# Patient Record
Sex: Male | Born: 1970 | Race: Black or African American | Hispanic: No | Marital: Married | Smoking: Never smoker
Health system: Southern US, Community
[De-identification: ages and names within clinical notes are randomized; demographics above are authoritative.]

## PROBLEM LIST (undated history)

## (undated) DIAGNOSIS — G8929 Other chronic pain: Secondary | ICD-10-CM

## (undated) DIAGNOSIS — C8190 Hodgkin lymphoma, unspecified, unspecified site: Secondary | ICD-10-CM

## (undated) DIAGNOSIS — R519 Headache, unspecified: Secondary | ICD-10-CM

## (undated) DIAGNOSIS — E785 Hyperlipidemia, unspecified: Secondary | ICD-10-CM

## (undated) DIAGNOSIS — R51 Headache: Secondary | ICD-10-CM

## (undated) HISTORY — PX: WISDOM TOOTH EXTRACTION: SHX21

## (undated) HISTORY — DX: Headache: R51

## (undated) HISTORY — PX: HERNIA REPAIR: SHX51

## (undated) HISTORY — DX: Hyperlipidemia, unspecified: E78.5

## (undated) HISTORY — DX: Hodgkin lymphoma, unspecified, unspecified site: C81.90

## (undated) HISTORY — DX: Other chronic pain: G89.29

## (undated) HISTORY — DX: Headache, unspecified: R51.9

---

## 1997-02-12 HISTORY — PX: PORTACATH PLACEMENT: SHX2246

## 1998-11-15 ENCOUNTER — Ambulatory Visit (HOSPITAL_COMMUNITY): Admission: RE | Admit: 1998-11-15 | Discharge: 1998-11-15 | Payer: Self-pay | Admitting: Family Medicine

## 1998-11-15 ENCOUNTER — Encounter: Payer: Self-pay | Admitting: Family Medicine

## 1998-11-17 ENCOUNTER — Ambulatory Visit (HOSPITAL_COMMUNITY): Admission: RE | Admit: 1998-11-17 | Discharge: 1998-11-17 | Payer: Self-pay | Admitting: Family Medicine

## 1998-11-17 ENCOUNTER — Encounter: Payer: Self-pay | Admitting: Family Medicine

## 1998-11-21 ENCOUNTER — Encounter: Payer: Self-pay | Admitting: Family Medicine

## 1998-11-21 ENCOUNTER — Encounter (INDEPENDENT_AMBULATORY_CARE_PROVIDER_SITE_OTHER): Payer: Self-pay | Admitting: *Deleted

## 1998-11-21 ENCOUNTER — Ambulatory Visit (HOSPITAL_COMMUNITY): Admission: RE | Admit: 1998-11-21 | Discharge: 1998-11-21 | Payer: Self-pay | Admitting: Family Medicine

## 1998-12-01 ENCOUNTER — Encounter: Payer: Self-pay | Admitting: Oncology

## 1998-12-01 ENCOUNTER — Ambulatory Visit (HOSPITAL_COMMUNITY): Admission: RE | Admit: 1998-12-01 | Discharge: 1998-12-01 | Payer: Self-pay | Admitting: Oncology

## 1998-12-05 ENCOUNTER — Ambulatory Visit (HOSPITAL_COMMUNITY): Admission: RE | Admit: 1998-12-05 | Discharge: 1998-12-05 | Payer: Self-pay | Admitting: Oncology

## 1998-12-05 ENCOUNTER — Encounter: Payer: Self-pay | Admitting: Oncology

## 1998-12-12 ENCOUNTER — Encounter: Payer: Self-pay | Admitting: Hematology & Oncology

## 1998-12-12 ENCOUNTER — Ambulatory Visit (HOSPITAL_COMMUNITY): Admission: RE | Admit: 1998-12-12 | Discharge: 1998-12-12 | Payer: Self-pay | Admitting: Oncology

## 1999-01-01 ENCOUNTER — Ambulatory Visit (HOSPITAL_COMMUNITY): Admission: RE | Admit: 1999-01-01 | Discharge: 1999-01-01 | Payer: Self-pay | Admitting: Oncology

## 1999-03-13 ENCOUNTER — Encounter: Payer: Self-pay | Admitting: Oncology

## 1999-03-13 ENCOUNTER — Ambulatory Visit (HOSPITAL_COMMUNITY): Admission: RE | Admit: 1999-03-13 | Discharge: 1999-03-13 | Payer: Self-pay | Admitting: Oncology

## 1999-07-12 ENCOUNTER — Encounter: Payer: Self-pay | Admitting: Oncology

## 1999-07-12 ENCOUNTER — Ambulatory Visit (HOSPITAL_COMMUNITY): Admission: RE | Admit: 1999-07-12 | Discharge: 1999-07-12 | Payer: Self-pay | Admitting: Gastroenterology

## 1999-10-06 ENCOUNTER — Encounter: Payer: Self-pay | Admitting: Oncology

## 1999-10-06 ENCOUNTER — Ambulatory Visit (HOSPITAL_COMMUNITY): Admission: RE | Admit: 1999-10-06 | Discharge: 1999-10-06 | Payer: Self-pay | Admitting: Oncology

## 1999-10-06 ENCOUNTER — Emergency Department (HOSPITAL_COMMUNITY): Admission: EM | Admit: 1999-10-06 | Discharge: 1999-10-07 | Payer: Self-pay | Admitting: Emergency Medicine

## 2000-03-05 ENCOUNTER — Ambulatory Visit (HOSPITAL_COMMUNITY): Admission: RE | Admit: 2000-03-05 | Discharge: 2000-03-05 | Payer: Self-pay | Admitting: Oncology

## 2000-03-05 ENCOUNTER — Encounter: Payer: Self-pay | Admitting: Oncology

## 2000-12-02 ENCOUNTER — Ambulatory Visit (HOSPITAL_COMMUNITY): Admission: RE | Admit: 2000-12-02 | Discharge: 2000-12-02 | Payer: Self-pay | Admitting: Oncology

## 2000-12-02 ENCOUNTER — Encounter: Payer: Self-pay | Admitting: Oncology

## 2001-04-15 ENCOUNTER — Ambulatory Visit (HOSPITAL_COMMUNITY): Admission: RE | Admit: 2001-04-15 | Discharge: 2001-04-15 | Payer: Self-pay | Admitting: Oncology

## 2001-04-15 ENCOUNTER — Encounter: Payer: Self-pay | Admitting: Oncology

## 2003-07-26 ENCOUNTER — Ambulatory Visit (HOSPITAL_COMMUNITY): Admission: RE | Admit: 2003-07-26 | Discharge: 2003-07-26 | Payer: Self-pay | Admitting: Oncology

## 2004-04-28 ENCOUNTER — Ambulatory Visit (HOSPITAL_COMMUNITY): Admission: RE | Admit: 2004-04-28 | Discharge: 2004-04-28 | Payer: Self-pay | Admitting: Oncology

## 2004-05-11 ENCOUNTER — Ambulatory Visit: Payer: Self-pay | Admitting: Oncology

## 2004-12-05 ENCOUNTER — Ambulatory Visit: Payer: Self-pay | Admitting: Oncology

## 2005-02-15 ENCOUNTER — Ambulatory Visit: Payer: Self-pay | Admitting: Oncology

## 2005-11-09 ENCOUNTER — Ambulatory Visit: Payer: Self-pay | Admitting: Oncology

## 2008-03-22 ENCOUNTER — Ambulatory Visit: Payer: Self-pay | Admitting: Oncology

## 2008-11-29 ENCOUNTER — Ambulatory Visit: Payer: Self-pay | Admitting: Oncology

## 2008-12-06 LAB — CBC WITH DIFFERENTIAL/PLATELET
BASO%: 0.2 % (ref 0.0–2.0)
HCT: 40 % (ref 38.4–49.9)
LYMPH%: 47.4 % (ref 14.0–49.0)
MCH: 22.6 pg — ABNORMAL LOW (ref 27.2–33.4)
MCV: 70 fL — ABNORMAL LOW (ref 79.3–98.0)
NEUT%: 43.5 % (ref 39.0–75.0)
RBC: 5.71 10*6/uL (ref 4.20–5.82)
RDW: 15 % — ABNORMAL HIGH (ref 11.0–14.6)
lymph#: 1.9 10*3/uL (ref 0.9–3.3)

## 2008-12-06 LAB — COMPREHENSIVE METABOLIC PANEL
ALT: 71 U/L — ABNORMAL HIGH (ref 0–53)
Albumin: 4.7 g/dL (ref 3.5–5.2)
CO2: 24 mEq/L (ref 19–32)
Calcium: 9.2 mg/dL (ref 8.4–10.5)
Chloride: 106 mEq/L (ref 96–112)
Potassium: 4 mEq/L (ref 3.5–5.3)
Total Bilirubin: 0.3 mg/dL (ref 0.3–1.2)
Total Protein: 7.9 g/dL (ref 6.0–8.3)

## 2009-02-16 ENCOUNTER — Encounter: Admission: RE | Admit: 2009-02-16 | Discharge: 2009-05-17 | Payer: Self-pay | Admitting: Family Medicine

## 2009-11-25 ENCOUNTER — Ambulatory Visit: Payer: Self-pay | Admitting: Oncology

## 2010-03-04 ENCOUNTER — Encounter: Payer: Self-pay | Admitting: Oncology

## 2010-11-26 ENCOUNTER — Emergency Department (HOSPITAL_COMMUNITY)
Admission: EM | Admit: 2010-11-26 | Discharge: 2010-11-26 | Disposition: A | Discharge status: Home / Self Care | Payer: BC Managed Care – PPO | Attending: Emergency Medicine | Admitting: Emergency Medicine

## 2010-11-26 DIAGNOSIS — L03211 Cellulitis of face: Secondary | ICD-10-CM | POA: Insufficient documentation

## 2010-11-26 DIAGNOSIS — L0201 Cutaneous abscess of face: Secondary | ICD-10-CM | POA: Insufficient documentation

## 2011-01-30 ENCOUNTER — Ambulatory Visit: Payer: BC Managed Care – PPO | Admitting: Oncology

## 2011-01-30 ENCOUNTER — Other Ambulatory Visit: Payer: BC Managed Care – PPO | Admitting: Lab

## 2011-05-05 ENCOUNTER — Encounter: Payer: Self-pay | Admitting: Internal Medicine

## 2011-05-05 DIAGNOSIS — E119 Type 2 diabetes mellitus without complications: Secondary | ICD-10-CM | POA: Insufficient documentation

## 2011-05-05 DIAGNOSIS — Z0000 Encounter for general adult medical examination without abnormal findings: Secondary | ICD-10-CM | POA: Insufficient documentation

## 2011-05-05 DIAGNOSIS — Z0001 Encounter for general adult medical examination with abnormal findings: Secondary | ICD-10-CM

## 2011-05-07 ENCOUNTER — Other Ambulatory Visit (INDEPENDENT_AMBULATORY_CARE_PROVIDER_SITE_OTHER): Payer: BC Managed Care – PPO

## 2011-05-07 ENCOUNTER — Other Ambulatory Visit: Payer: Self-pay | Admitting: Internal Medicine

## 2011-05-07 ENCOUNTER — Ambulatory Visit (INDEPENDENT_AMBULATORY_CARE_PROVIDER_SITE_OTHER): Payer: BC Managed Care – PPO | Admitting: Internal Medicine

## 2011-05-07 ENCOUNTER — Encounter: Payer: Self-pay | Admitting: Internal Medicine

## 2011-05-07 VITALS — BP 102/62 | HR 82 | Temp 98.4°F | Ht 74.0 in | Wt 229.0 lb

## 2011-05-07 DIAGNOSIS — Z0000 Encounter for general adult medical examination without abnormal findings: Secondary | ICD-10-CM

## 2011-05-07 DIAGNOSIS — C8590 Non-Hodgkin lymphoma, unspecified, unspecified site: Secondary | ICD-10-CM | POA: Insufficient documentation

## 2011-05-07 DIAGNOSIS — IMO0001 Reserved for inherently not codable concepts without codable children: Secondary | ICD-10-CM

## 2011-05-07 DIAGNOSIS — E785 Hyperlipidemia, unspecified: Secondary | ICD-10-CM | POA: Insufficient documentation

## 2011-05-07 DIAGNOSIS — Z79899 Other long term (current) drug therapy: Secondary | ICD-10-CM

## 2011-05-07 LAB — CBC WITH DIFFERENTIAL/PLATELET
Basophils Absolute: 0 10*3/uL (ref 0.0–0.1)
Eosinophils Absolute: 0 10*3/uL (ref 0.0–0.7)
Eosinophils Relative: 1 % (ref 0.0–5.0)
HCT: 42.2 % (ref 39.0–52.0)
Lymphocytes Relative: 50.6 % — ABNORMAL HIGH (ref 12.0–46.0)
Lymphs Abs: 2.1 10*3/uL (ref 0.7–4.0)
MCV: 70.5 fl — ABNORMAL LOW (ref 78.0–100.0)
Monocytes Absolute: 0.3 10*3/uL (ref 0.1–1.0)
Monocytes Relative: 6.9 % (ref 3.0–12.0)
Neutro Abs: 1.7 10*3/uL (ref 1.4–7.7)
Platelets: 232 10*3/uL (ref 150.0–400.0)
WBC: 4.1 10*3/uL — ABNORMAL LOW (ref 4.5–10.5)

## 2011-05-07 LAB — URINALYSIS, ROUTINE W REFLEX MICROSCOPIC
Specific Gravity, Urine: >=1.03 (ref 1.000–1.030)
Urine Glucose: NEGATIVE

## 2011-05-07 LAB — HEPATIC FUNCTION PANEL
ALT: 61 U/L — ABNORMAL HIGH (ref 0–53)
Albumin: 4.4 g/dL (ref 3.5–5.2)
Alkaline Phosphatase: 62 U/L (ref 39–117)
Total Protein: 8.6 g/dL — ABNORMAL HIGH (ref 6.0–8.3)

## 2011-05-07 LAB — LIPID PANEL
Cholesterol: 181 mg/dL (ref 0–200)
HDL: 48.8 mg/dL (ref >39.00)
Total CHOL/HDL Ratio: 4
Triglycerides: 103 mg/dL (ref 0.0–149.0)

## 2011-05-07 LAB — TSH: TSH: 1.29 u[IU]/mL (ref 0.35–5.50)

## 2011-05-07 LAB — BASIC METABOLIC PANEL
Chloride: 109 mEq/L (ref 96–112)
GFR: 86.49 mL/min (ref >60.00)
Glucose, Bld: 119 mg/dL — ABNORMAL HIGH (ref 70–99)

## 2011-05-07 MED ORDER — PRAVASTATIN SODIUM 20 MG PO TABS: 20.0000 mg | ORAL_TABLET | Freq: Every day | ORAL | Status: DC

## 2011-05-07 MED ORDER — ASPIRIN 81 MG PO TBEC: 81.0000 mg | DELAYED_RELEASE_TABLET | Freq: Every day | ORAL | Status: AC

## 2011-05-07 MED ORDER — METFORMIN HCL 500 MG PO TABS: ORAL_TABLET | ORAL | Status: DC

## 2011-05-07 NOTE — Progress Notes (Signed)
Subjective:    Patient ID: William Sheppard, male    DOB: [birth year], 41 y.o.   MRN: [id number]  HPI  Here for wellness and to establish as new pt;  Overall doing ok;  Pt denies CP, worsening SOB, DOE, wheezing, orthopnea, PND, worsening LE edema, palpitations, dizziness or syncope.  Pt denies neurological change such as new Headache, facial or extremity weakness.  Pt denies polydipsia, polyuria, or low sugar symptoms. Pt states overall good compliance with treatment and medications, good tolerability, and trying to follow lower cholesterol diet.  Pt denies worsening depressive symptoms, suicidal ideation or panic. No fever, night sweats, loss of appetite, or other constitutional symptoms.  Pt states good ability with ADL's, low fall risk, home safety reviewed and adequate, no significant changes in hearing or vision, and occasionally active with exercise.  Does have discomfort to the right lateral prox leg and right knee  with basketball, no LBP, slows him down only when he rests and gets up to start move again; notices more at night to move around in the bed; no trauma, fever, falls, swelling, or giveaway. Has been able to lose some wt though not sure how much in the past yr with better diet Past Medical History  Diagnosis Date  . Diabetes mellitus   . Chronic headaches   . Hyperlipidemia   . Non Hodgkin's lymphoma    History reviewed. No pertinent past surgical history.  reports that he has never smoked. He does not have any smokeless tobacco history on file. He reports that he drinks alcohol. He reports that he does not use illicit drugs. family history includes Diabetes in his other. No Known Allergies No current outpatient prescriptions on file prior to visit.   Review of Systems Review of Systems  Constitutional: Negative for diaphoresis, activity change, appetite change and unexpected weight change.  HENT: Negative for hearing loss, ear pain, facial swelling, mouth sores and neck  stiffness.   Eyes: Negative for pain, redness and visual disturbance.  Respiratory: Negative for shortness of breath and wheezing.   Cardiovascular: Negative for chest pain and palpitations.  Gastrointestinal: Negative for diarrhea, blood in stool, abdominal distention and rectal pain.  Genitourinary: Negative for hematuria, flank pain and decreased urine volume.  Musculoskeletal: Negative for myalgias and joint swelling.  Skin: Negative for color change and wound.  Neurological: Negative for syncope and numbness.  Hematological: Negative for adenopathy.  Psychiatric/Behavioral: Negative for hallucinations, self-injury, decreased concentration and agitation.  Does have a cyst to the leg prox right    Objective:   Physical Exam BP 102/62  Pulse 82  Temp(Src) 98.4 F (36.9 C) (Oral)  Ht 6\' 2"  (1.88 m)  Wt 229 lb (103.874 kg)  BMI 29.40 kg/m2  SpO2 96% Physical Exam  VS noted Constitutional: Pt is oriented to person, place, and time. Appears well-developed and well-nourished.  HENT:  Head: Normocephalic and atraumatic.  Right Ear: External ear normal.  Left Ear: External ear normal.  Nose: Nose normal.  Mouth/Throat: Oropharynx is clear and moist.  Eyes: Conjunctivae and EOM are normal. Pupils are equal, round, and reactive to light.  Neck: Normal range of motion. Neck supple. No JVD present. No tracheal deviation present.  Cardiovascular: Normal rate, regular rhythm, normal heart sounds and intact distal pulses.   Pulmonary/Chest: Effort normal and breath sounds normal.  Abdominal: Soft. Bowel sounds are normal. There is no tenderness.  Musculoskeletal: Normal range of motion. Exhibits no edema.  Lymphadenopathy:  Has no cervical adenopathy.  Neurological: Pt is alert and oriented to person, place, and time. Pt has normal reflexes. No cranial nerve deficit.  Skin: Skin is warm and dry. No rash noted.  Psychiatric:  Has  normal mood and affect. Behavior is normal.  Right hip and  knee NT, FROM, no effusion    Assessment & Plan:

## 2011-05-07 NOTE — Assessment & Plan Note (Signed)

## 2011-05-07 NOTE — Patient Instructions (Signed)
Please start aspirin 81 mg - 1 per day, COATED only Please keep your appointments with your specialists as you have planned - Dr Darnelle Catalan every 5 yrs Please go to LAB in the Basement for the blood and/or urine tests to be done today You will be contacted by phone if any changes need to be made immediately.  Otherwise, you will receive a letter about your results with an explanation. Please continue to focus on exercise, lower cholesterol diet, and wt loss Please return in 1 year for your yearly visit, or sooner if needed, with Lab testing done 3-5 days before

## 2011-05-07 NOTE — Assessment & Plan Note (Signed)
stable overall by hx and exam, , and pt to continue medical treatment as before   

## 2011-05-09 ENCOUNTER — Telehealth: Payer: Self-pay

## 2011-05-09 MED ORDER — GLIPIZIDE ER 2.5 MG PO TB24: 2.5000 mg | ORAL_TABLET | Freq: Every day | ORAL | Status: DC

## 2011-05-09 NOTE — Telephone Encounter (Signed)
Addended by: Corwin Levins on: 05/09/2011 01:31 PM   Modules accepted: Orders

## 2011-05-09 NOTE — Telephone Encounter (Signed)
The patient called back to receive lab results and instructions. He stated he has been on Metformin in the past and it caused upset stomach. Please alternative, also wanted to know if anything herbal remedies he can try for cholesterol. He did agree to take pravachol.

## 2011-05-09 NOTE — Telephone Encounter (Signed)
Patient has been informed of medication change. 

## 2011-05-09 NOTE — Telephone Encounter (Signed)
There are no "natural" OTC's that are effective for cholesterol lowering  Ok to d/c the metformin  Ok to start the glipizide ER low dose - done per [employer]

## 2011-05-16 ENCOUNTER — Encounter: Payer: Self-pay | Admitting: Internal Medicine

## 2011-06-27 ENCOUNTER — Telehealth: Payer: Self-pay

## 2011-06-27 MED ORDER — METFORMIN HCL 500 MG PO TABS: 500.0000 mg | ORAL_TABLET | Freq: Every day | ORAL | Status: DC

## 2011-06-27 NOTE — Telephone Encounter (Signed)
Called the patient left detailed message prescription requested has been sent in. 

## 2011-06-27 NOTE — Telephone Encounter (Signed)
It is not clear to me his symtpoms are from the glucotrol, but ok to change to metformin -done per emr

## 2011-06-27 NOTE — Telephone Encounter (Signed)
The patient called to inform he is having some dizziness and tingling in his feet.  The patient thinks these symptoms may be coming from the glipizide, he would like to change to Metformin. Please advise, pharmacy is [facility]

## 2011-07-27 ENCOUNTER — Ambulatory Visit (INDEPENDENT_AMBULATORY_CARE_PROVIDER_SITE_OTHER): Payer: BC Managed Care – PPO | Admitting: Endocrinology

## 2011-07-27 ENCOUNTER — Ambulatory Visit (INDEPENDENT_AMBULATORY_CARE_PROVIDER_SITE_OTHER)
Admission: RE | Admit: 2011-07-27 | Discharge: 2011-07-27 | Disposition: A | Discharge status: Home / Self Care | Payer: BC Managed Care – PPO | Source: Ambulatory Visit | Attending: Endocrinology | Admitting: Endocrinology

## 2011-07-27 ENCOUNTER — Encounter: Payer: Self-pay | Admitting: Endocrinology

## 2011-07-27 VITALS — BP 108/72 | HR 87 | Temp 97.8°F | Ht 74.0 in | Wt 221.0 lb

## 2011-07-27 DIAGNOSIS — M79609 Pain in unspecified limb: Secondary | ICD-10-CM

## 2011-07-27 DIAGNOSIS — M79671 Pain in right foot: Secondary | ICD-10-CM

## 2011-07-27 NOTE — Progress Notes (Signed)
  Subjective:    Patient ID: William Sheppard, male    DOB: [birth year], 41 y.o.   MRN: [id number]  HPI Pt states few mos of moderate pain at the right heel.  No assoc numbness.  It did not start in the context of an injury.   Past Medical History  Diagnosis Date  . Diabetes mellitus   . Chronic headaches   . Hyperlipidemia   . Non Hodgkin's lymphoma     No past surgical history on file.  History   Social History  . Marital Status: Married    Spouse Name: N/A    Number of Children: N/A  . Years of Education: masters   Occupational History  . Naval architect   Social History Main Topics  . Smoking status: Never Smoker   . Smokeless tobacco: Not on file  . Alcohol Use: Yes     social  . Drug Use: No  . Sexually Active: Not on file   Other Topics Concern  . Not on file   Social History Narrative  . No narrative on file    Current Outpatient Prescriptions on File Prior to Visit  Medication Sig Dispense Refill  . aspirin 81 MG EC tablet Take 1 tablet (81 mg total) by mouth daily. Swallow whole.  30 tablet  12  . metFORMIN (GLUCOPHAGE) 500 MG tablet Take 1 tablet (500 mg total) by mouth daily.  90 tablet  3  . pravastatin (PRAVACHOL) 20 MG tablet Take 1 tablet (20 mg total) by mouth daily.  90 tablet  3    Allergies  Allergen Reactions  . Metformin And Related Nausea Only   Family History  Problem Relation Age of Onset  . Diabetes Other    BP 108/72  Pulse 87  Temp 97.8 F (36.6 C) (Oral)  Ht 6\' 2"  (1.88 m)  Wt 221 lb (100.245 kg)  BMI 28.37 kg/m2  SpO2 97%  Review of Systems Denies left foot pain and cold intolerance.     Objective:   Physical Exam VITAL SIGNS:  See vs page GENERAL: no distress Pulses: dorsalis pedis intact bilat.   Feet: no deformity.  no ulcer on the feet.  feet are of normal color and temp.  no edema Neuro: sensation is intact to touch on the feet The left heel is normal.    (i reviewed x-ray result)    Assessment &  Plan:  Foot pain, new.  uncertain etiology Type 2 DM.  This places him at risk for PAD.

## 2011-07-27 NOTE — Patient Instructions (Addendum)
Refer to a foot specialist.   Let's check a test for the circulation.  you will receive a phone call, about days and times for appointments.   An x-ray is requested for you today.  You will receive a letter with results.

## 2011-08-06 ENCOUNTER — Ambulatory Visit (INDEPENDENT_AMBULATORY_CARE_PROVIDER_SITE_OTHER): Payer: BC Managed Care – PPO | Admitting: Internal Medicine

## 2011-08-06 ENCOUNTER — Other Ambulatory Visit (INDEPENDENT_AMBULATORY_CARE_PROVIDER_SITE_OTHER): Payer: BC Managed Care – PPO

## 2011-08-06 ENCOUNTER — Encounter: Payer: Self-pay | Admitting: Internal Medicine

## 2011-08-06 VITALS — BP 112/70 | HR 89 | Temp 97.8°F | Ht 74.0 in | Wt 227.0 lb

## 2011-08-06 DIAGNOSIS — E785 Hyperlipidemia, unspecified: Secondary | ICD-10-CM

## 2011-08-06 DIAGNOSIS — Z79899 Other long term (current) drug therapy: Secondary | ICD-10-CM

## 2011-08-06 DIAGNOSIS — M79609 Pain in unspecified limb: Secondary | ICD-10-CM

## 2011-08-06 DIAGNOSIS — M79671 Pain in right foot: Secondary | ICD-10-CM

## 2011-08-06 LAB — HEPATIC FUNCTION PANEL
Albumin: 4.4 g/dL (ref 3.5–5.2)
Alkaline Phosphatase: 48 U/L (ref 39–117)
Bilirubin, Direct: 0 mg/dL (ref 0.0–0.3)
Total Protein: 8.3 g/dL (ref 6.0–8.3)

## 2011-08-06 LAB — HEMOGLOBIN A1C: Hgb A1c MFr Bld: 7 % — ABNORMAL HIGH (ref 4.6–6.5)

## 2011-08-06 LAB — BASIC METABOLIC PANEL
BUN: 21 mg/dL (ref 6–23)
CO2: 27 mEq/L (ref 19–32)
Chloride: 107 mEq/L (ref 96–112)
Potassium: 4.1 mEq/L (ref 3.5–5.1)

## 2011-08-06 LAB — LIPID PANEL
Cholesterol: 166 mg/dL (ref 0–200)
LDL Cholesterol: 97 mg/dL (ref 0–99)
Total CHOL/HDL Ratio: 3
VLDL: 18.2 mg/dL (ref 0.0–40.0)

## 2011-08-06 NOTE — Patient Instructions (Addendum)
Continue all other medications as before Please start the Aspirin 81 mg  - 1 per day - COATED only Please go to LAB in the Basement for the blood and/or urine tests to be done today You will be contacted by phone if any changes need to be made immediately.  Otherwise, you will receive a letter about your results with an explanation. Please return in 6 mo with Lab testing done 3-5 days before

## 2011-08-06 NOTE — Assessment & Plan Note (Signed)
Heel pain, c/w plantar fasciitis, ok to hold the LE arterial dopplers., for alleve prn, consider podiatry for cortisone, for stretching excercises and avoid hard surfaces/long standing/hard soled shoes

## 2011-08-06 NOTE — Assessment & Plan Note (Addendum)
stable overall by hx and exam, most recent data reviewed with pt, and pt to continue medical treatment as before Lab Results  Component Value Date   LDLCALC 112* 05/07/2011   To re-check on statin

## 2011-08-06 NOTE — Assessment & Plan Note (Signed)
stable overall by hx and exam, most recent data reviewed with pt, and pt to continue medical treatment as before - the new low dose metformin, cont wt loss effort Lab Results  Component Value Date   HGBA1C 7.9* 05/07/2011

## 2011-08-06 NOTE — Progress Notes (Signed)
  Subjective:    Patient ID: William Sheppard, male    DOB: [birth year], 41 y.o.   MRN: [id number]  HPI  Here to f/u; overall doing ok,  Pt denies chest pain, increased sob or doe, wheezing, orthopnea, PND, increased LE swelling, palpitations, dizziness or syncope.  Pt denies new neurological symptoms such as new headache, or facial or extremity weakness or numbness   Pt denies polydipsia, polyuria, or low sugar symptoms such as weakness or confusion improved with po intake.  Pt states overall good compliance with meds, trying to follow lower cholesterol, diabetic diet, wt overall stable but little exercise however.  Has been able to lose a few more since last visit.  Did see Dr Everardo All with heel pain recently, worse to first get up and stand up, and worse after playiong basketball.  Has not gotten the vascular study done.   Past Medical History  Diagnosis Date  . Diabetes mellitus   . Chronic headaches   . Hyperlipidemia   . Non Hodgkin's lymphoma    No past surgical history on file.  reports that he has never smoked. He does not have any smokeless tobacco history on file. He reports that he drinks alcohol. He reports that he does not use illicit drugs. family history includes Diabetes in his other. Allergies  Allergen Reactions  . Metformin And Related Nausea Only   Current Outpatient Prescriptions on File Prior to Visit  Medication Sig Dispense Refill  . metFORMIN (GLUCOPHAGE) 500 MG tablet Take 1 tablet (500 mg total) by mouth daily.  90 tablet  3  . pravastatin (PRAVACHOL) 20 MG tablet Take 1 tablet (20 mg total) by mouth daily.  90 tablet  3  . aspirin 81 MG EC tablet Take 1 tablet (81 mg total) by mouth daily. Swallow whole.  30 tablet  12   Review of Systems Constitutional: Negative for diaphoresis and unexpected weight change.  HENT: Negative for drooling and tinnitus.   Eyes: Negative for photophobia and visual disturbance.  Respiratory: Negative for choking and stridor.     Gastrointestinal: Negative for vomiting and blood in stool.  Genitourinary: Negative for hematuria and decreased urine volume.  Musculoskeletal: still with right heel pain/tender.  Skin: Negative for color change and wound.  Neurological: Negative for tremors and numbness.  Psychiatric/Behavioral: Negative for decreased concentration. The patient is not hyperactive.      Objective:   Physical Exam BP 112/70  Pulse 89  Temp 97.8 F (36.6 C) (Oral)  Ht 6\' 2"  (1.88 m)  Wt 227 lb (102.967 kg)  BMI 29.15 kg/m2  SpO2 95% Physical Exam  VS noted Constitutional: Pt appears well-developed and well-nourished.  HENT: Head: Normocephalic.  Right Ear: External ear normal.  Left Ear: External ear normal.  Eyes: Conjunctivae and EOM are normal. Pupils are equal, round, and reactive to light.  Neck: Normal range of motion. Neck supple.  Cardiovascular: Normal rate and regular rhythm.   Pulmonary/Chest: Effort normal and breath sounds normal.  Neurological: Pt is alert. Not confused Skin: Skin is warm. No erythema.  Psychiatric: Pt behavior is normal. Thought content normal.  Right heel:  Mild tender right plantar heel without red, swelling, ulcer    Assessment & Plan:

## 2011-10-16 ENCOUNTER — Other Ambulatory Visit: Payer: Self-pay

## 2011-10-16 MED ORDER — METFORMIN HCL 500 MG PO TABS: 500.0000 mg | ORAL_TABLET | Freq: Every day | ORAL | Status: DC

## 2011-10-16 MED ORDER — PRAVASTATIN SODIUM 20 MG PO TABS: 20.0000 mg | ORAL_TABLET | Freq: Every day | ORAL | Status: DC

## 2012-01-28 ENCOUNTER — Ambulatory Visit: Payer: BC Managed Care – PPO | Admitting: Internal Medicine

## 2012-01-28 DIAGNOSIS — Z0289 Encounter for other administrative examinations: Secondary | ICD-10-CM

## 2012-03-04 ENCOUNTER — Ambulatory Visit: Payer: BC Managed Care – PPO | Admitting: Internal Medicine

## 2012-04-25 ENCOUNTER — Telehealth: Payer: Self-pay

## 2012-04-25 MED ORDER — METFORMIN HCL 500 MG PO TABS: 500.0000 mg | ORAL_TABLET | Freq: Every day | ORAL | Status: DC

## 2012-04-25 NOTE — Telephone Encounter (Signed)
Refilled Metformin.

## 2012-05-07 ENCOUNTER — Encounter: Payer: Self-pay | Admitting: Internal Medicine

## 2012-05-07 ENCOUNTER — Ambulatory Visit (INDEPENDENT_AMBULATORY_CARE_PROVIDER_SITE_OTHER): Payer: BC Managed Care – PPO | Admitting: Internal Medicine

## 2012-05-07 ENCOUNTER — Other Ambulatory Visit (INDEPENDENT_AMBULATORY_CARE_PROVIDER_SITE_OTHER): Payer: BC Managed Care – PPO

## 2012-05-07 VITALS — BP 112/80 | HR 74 | Temp 97.2°F | Ht 74.0 in | Wt 224.0 lb

## 2012-05-07 DIAGNOSIS — Z0000 Encounter for general adult medical examination without abnormal findings: Secondary | ICD-10-CM

## 2012-05-07 DIAGNOSIS — IMO0001 Reserved for inherently not codable concepts without codable children: Secondary | ICD-10-CM

## 2012-05-07 LAB — BASIC METABOLIC PANEL
BUN: 15 mg/dL (ref 6–23)
GFR: 92.23 mL/min (ref >60.00)
Glucose, Bld: 110 mg/dL — ABNORMAL HIGH (ref 70–99)
Potassium: 5.2 mEq/L — ABNORMAL HIGH (ref 3.5–5.1)

## 2012-05-07 LAB — LIPID PANEL
Cholesterol: 145 mg/dL (ref 0–200)
VLDL: 21.2 mg/dL (ref 0.0–40.0)

## 2012-05-07 LAB — URINALYSIS, ROUTINE W REFLEX MICROSCOPIC
Ketones, ur: NEGATIVE
Leukocytes, UA: NEGATIVE
Specific Gravity, Urine: 1.025 (ref 1.000–1.030)
Urobilinogen, UA: 0.2 (ref 0.0–1.0)

## 2012-05-07 LAB — PSA: PSA: 0.39 ng/mL (ref 0.10–4.00)

## 2012-05-07 LAB — MICROALBUMIN / CREATININE URINE RATIO: Microalb, Ur: 6.9 mg/dL — ABNORMAL HIGH (ref 0.0–1.9)

## 2012-05-07 LAB — CBC WITH DIFFERENTIAL/PLATELET
Eosinophils Relative: 0.7 % (ref 0.0–5.0)
HCT: 42.7 % (ref 39.0–52.0)
Monocytes Relative: 7.4 % (ref 3.0–12.0)
Neutrophils Relative %: 38.7 % — ABNORMAL LOW (ref 43.0–77.0)
Platelets: 222 10*3/uL (ref 150.0–400.0)
WBC: 4.8 10*3/uL (ref 4.5–10.5)

## 2012-05-07 LAB — HEPATIC FUNCTION PANEL
ALT: 42 U/L (ref 0–53)
AST: 23 U/L (ref 0–37)
Albumin: 4.6 g/dL (ref 3.5–5.2)

## 2012-05-07 LAB — HEMOGLOBIN A1C: Hgb A1c MFr Bld: 6.6 % — ABNORMAL HIGH (ref 4.6–6.5)

## 2012-05-07 LAB — TSH: TSH: 1.58 u[IU]/mL (ref 0.35–5.50)

## 2012-05-07 MED ORDER — PRAVASTATIN SODIUM 20 MG PO TABS: 20.0000 mg | ORAL_TABLET | Freq: Every day | ORAL | Status: DC

## 2012-05-07 MED ORDER — LISINOPRIL 5 MG PO TABS: 5.0000 mg | ORAL_TABLET | Freq: Every day | ORAL | Status: DC

## 2012-05-07 MED ORDER — ASPIRIN 81 MG PO TBEC: 81.0000 mg | DELAYED_RELEASE_TABLET | Freq: Every day | ORAL | Status: DC

## 2012-05-07 MED ORDER — METFORMIN HCL 500 MG PO TABS: 500.0000 mg | ORAL_TABLET | Freq: Every day | ORAL | Status: DC

## 2012-05-07 NOTE — Assessment & Plan Note (Signed)

## 2012-05-07 NOTE — Progress Notes (Addendum)
Subjective:    Patient ID: William Sheppard, male    DOB: 07-23-1970, 42 y.o.   MRN: [id number]  HPI  Here for wellness and f/u;  Overall doing ok;  Pt denies CP, worsening SOB, DOE, wheezing, orthopnea, PND, worsening LE edema, palpitations, dizziness or syncope.  Pt denies neurological change such as new headache, facial or extremity weakness.  Pt denies polydipsia, polyuria, or low sugar symptoms. Pt states overall good compliance with treatment and medications, good tolerability, and has been trying to follow lower cholesterol diet.  Pt denies worsening depressive symptoms, suicidal ideation or panic. No fever, night sweats, wt loss, loss of appetite, or other constitutional symptoms.  Pt states good ability with ADL's, has low fall risk, home safety reviewed and adequate, no other significant changes in hearing or vision, and only occasionally active with exercise.  Needs med refills, no acute complaints, has been doing better recently with more exercise and wt loss, tyring to get to 210 Past Medical History  Diagnosis Date  . Diabetes mellitus   . Chronic headaches   . Hyperlipidemia   . Non Hodgkin's lymphoma    No past surgical history on file.  reports that he has never smoked. He does not have any smokeless tobacco history on file. He reports that  drinks alcohol. He reports that he does not use illicit drugs. family history includes Diabetes in his other. Allergies  Allergen Reactions  . Metformin And Related Nausea Only   No current outpatient prescriptions on file prior to visit.   No current facility-administered medications on file prior to visit.   Review of Systems Constitutional: Negative for diaphoresis, activity change, appetite change or unexpected weight change.  HENT: Negative for hearing loss, ear pain, facial swelling, mouth sores and neck stiffness.   Eyes: Negative for pain, redness and visual disturbance.  Respiratory: Negative for shortness of breath and  wheezing.   Cardiovascular: Negative for chest pain and palpitations.  Gastrointestinal: Negative for diarrhea, blood in stool, abdominal distention or other pain Genitourinary: Negative for hematuria, flank pain or change in urine volume.  Musculoskeletal: Negative for myalgias and joint swelling.  Skin: Negative for color change and wound.  Neurological: Negative for syncope and numbness. other than noted Hematological: Negative for adenopathy.  Psychiatric/Behavioral: Negative for hallucinations, self-injury, decreased concentration and agitation.      Objective:   Physical Exam BP 112/80  Pulse 74  Temp(Src) 97.2 F (36.2 C) (Oral)  Ht 6\' 2"  (1.88 m)  Wt 224 lb (101.606 kg)  BMI 28.75 kg/m2  SpO2 97% VS noted,  Constitutional: Pt is oriented to person, place, and time. Appears well-developed and well-nourished.  Head: Normocephalic and atraumatic.  Right Ear: External ear normal.  Left Ear: External ear normal.  Nose: Nose normal.  Mouth/Throat: Oropharynx is clear and moist.  Eyes: Conjunctivae and EOM are normal. Pupils are equal, round, and reactive to light.  Neck: Normal range of motion. Neck supple. No JVD present. No tracheal deviation present.  Cardiovascular: Normal rate, regular rhythm, normal heart sounds and intact distal pulses.   Pulmonary/Chest: Effort normal and breath sounds normal.  Abdominal: Soft. Bowel sounds are normal. There is no tenderness. No HSM  Musculoskeletal: Normal range of motion. Exhibits no edema.  Lymphadenopathy:  Has no cervical adenopathy.  Neurological: Pt is alert and oriented to person, place, and time. Pt has normal reflexes. No cranial nerve deficit.  Skin: Skin is warm and dry. No rash noted.  Psychiatric:  Has  normal mood and affect. Behavior is normal.     Assessment & Plan:

## 2012-05-07 NOTE — Patient Instructions (Addendum)
Please take all new medication as prescribed  - the low dose blood pressure medication (which is really for helping to protect your kidneys), and the Aspirin 81 mg per day (coated only) Please continue all other medications as before Please continue your efforts at being more active, low cholesterol diabetic diet, and weight control. Please have the pharmacy call with any other refills you may need. Please go to the LAB in the Basement (turn left off the elevator) for the tests to be done today You will be contacted by phone if any changes need to be made immediately.  Otherwise, you will receive a letter about your results with an explanation, but please check with MyChart first. Thank you for enrolling in MyChart. Please follow the instructions below to securely access your online medical record. MyChart allows you to send messages to your doctor, view your test results, renew your prescriptions, schedule appointments, and more. To Log into My Chart online, please go by [employer] or [employer] to [email or web address], or download the MyChart App from the [employer] of [employer] .  Your Username is: [phone] (pass [email or web address]) Please send a practice Message on Mychart later today. Please return in 6 months, or sooner if needed, with Lab testing done 3-5 days before

## 2012-05-07 NOTE — Assessment & Plan Note (Addendum)
stable overall by history and exam, recent data reviewed with pt, and pt to continue medical treatment as before,  to f/u any worsening symptoms or concerns Lab Results  Component Value Date   HGBA1C 6.6* 05/07/2012  to also start asa 81mg  dialy, and low dose ACE for renoprotection

## 2012-05-29 ENCOUNTER — Encounter: Payer: Self-pay | Admitting: Internal Medicine

## 2012-05-31 ENCOUNTER — Encounter: Payer: Self-pay | Admitting: Family Medicine

## 2012-05-31 ENCOUNTER — Other Ambulatory Visit: Payer: Self-pay | Admitting: Family Medicine

## 2012-05-31 ENCOUNTER — Ambulatory Visit (INDEPENDENT_AMBULATORY_CARE_PROVIDER_SITE_OTHER): Payer: BC Managed Care – PPO | Admitting: Family Medicine

## 2012-05-31 VITALS — BP 122/82 | HR 106 | Temp 98.7°F | Wt 220.8 lb

## 2012-05-31 DIAGNOSIS — M549 Dorsalgia, unspecified: Secondary | ICD-10-CM | POA: Insufficient documentation

## 2012-05-31 DIAGNOSIS — M546 Pain in thoracic spine: Secondary | ICD-10-CM

## 2012-05-31 DIAGNOSIS — R319 Hematuria, unspecified: Secondary | ICD-10-CM

## 2012-05-31 DIAGNOSIS — M545 Low back pain, unspecified: Secondary | ICD-10-CM

## 2012-05-31 HISTORY — DX: Dorsalgia, unspecified: M54.9

## 2012-05-31 LAB — POCT URINALYSIS DIPSTICK
Bilirubin, UA: NEGATIVE
Glucose, UA: NEGATIVE
Ketones, UA: NEGATIVE
Leukocytes, UA: NEGATIVE
Nitrite, UA: NEGATIVE
pH, UA: 6

## 2012-05-31 MED ORDER — TRAMADOL HCL 50 MG PO TABS: 50.0000 mg | ORAL_TABLET | Freq: Three times a day (TID) | ORAL | Status: DC | PRN

## 2012-05-31 MED ORDER — CYCLOBENZAPRINE HCL 10 MG PO TABS: 10.0000 mg | ORAL_TABLET | Freq: Every evening | ORAL | Status: DC | PRN

## 2012-05-31 NOTE — Progress Notes (Signed)
  Subjective:    Patient ID: William Sheppard, male    DOB: 07-Oct-1970, 42 y.o.   MRN: [id number]  HPI 42 year old male with hx of DM, non hodgkin's lymphoma presents with new onset left mid back back pain.  Pain with bending over, moving, sitting a while. No pain with standing or walking.. If turning back has pain.  Having spasms in muscles. Hurting with deep breaths.  No new injury. He has had similar pain in past in 2013 in exact same spot... Improved with NSAids and time.  No fever. 2 days ago had some cold symptoms.. Low grade temp. No urinary urgency, frequency, no pain. No weakness or numbness. No radiculopathy.  Has started meloxicam.. Which has helped some.       Review of Systems  Constitutional: Negative for fever and fatigue.  HENT: Negative for ear pain.   Eyes: Negative for pain.  Respiratory: Negative for shortness of breath.   Cardiovascular: Negative for chest pain.  Gastrointestinal: Negative for abdominal pain.  Genitourinary: Negative for hematuria and flank pain.       Objective:   Physical Exam  Constitutional: Vital signs are normal. He appears well-developed and well-nourished.  HENT:  Head: Normocephalic.  Right Ear: Hearing normal.  Left Ear: Hearing normal.  Nose: Nose normal.  Mouth/Throat: Oropharynx is clear and moist and mucous membranes are normal.  Neck: Trachea normal. Carotid bruit is not present. No mass and no thyromegaly present.  Cardiovascular: Normal rate, regular rhythm and normal pulses.  Exam reveals no gallop, no distant heart sounds and no friction rub.   No murmur heard. No peripheral edema  Pulmonary/Chest: Effort normal and breath sounds normal. No respiratory distress.  Musculoskeletal:       Thoracic back: He exhibits decreased range of motion and tenderness. He exhibits no bony tenderness.       Lumbar back: He exhibits decreased range of motion. He exhibits no tenderness and no bony tenderness.       Back:  ttp over  left mid back... Right below CVA Neg SLR, neg Faber's  Neurological: He has normal strength. He exhibits normal muscle tone.  Skin: Skin is warm, dry and intact. No rash noted.  Psychiatric: He has a normal mood and affect. His speech is normal and behavior is normal. Thought content normal.          Assessment & Plan:

## 2012-05-31 NOTE — Patient Instructions (Addendum)
Continue meloxicam.. Make sure it is 15 mg daily. Start muscle relaxant at night. Use tramadol for pain. Heat , gentle stretching of back. We will call you if urine culture shows any infection. Call if fever, N/V develops or severe pain in back.

## 2012-05-31 NOTE — Assessment & Plan Note (Signed)
Unable to do micro to determine if contamination or true result. No symptoms of UTI/pyelo... send for culture.  If back pain not resolving with treatment as MSK strain as it did in past.. Consider stone work up.

## 2012-05-31 NOTE — Assessment & Plan Note (Signed)
Similar to past MSK in same location. Treat with NSAIDs, heat, stretching and tramadol for pain. If not improving as expected consider possibility of stone (pt has not history).

## 2012-06-01 LAB — URINE CULTURE
Colony Count: NO GROWTH
Organism ID, Bacteria: NO GROWTH

## 2012-06-02 ENCOUNTER — Telehealth: Payer: Self-pay | Admitting: Internal Medicine

## 2012-06-02 NOTE — Telephone Encounter (Signed)
[facility] Triage Call Report Triage Record Num: [id number] Operator: Donnella Sham Patient Name: William Sheppard Call Date & Time: 05/30/2012 1:33:38PM Patient Phone: [phone] PCP: Oliver Barre Patient Gender: Male PCP Fax : [phone] Patient DOB: Apr 12, 1970 Practice Name: [facility] Reason for Call: Caller: [name]/Patient; PCP: Oliver Barre (Adults only); CB#: [phone]; Call regarding Back Pain; onset 05/28/12 to mid to lower back; hurts worse when he turns or even takes a deep breath; feels like muscle spasms; taking Meloxicam; able to walk; denies injury; afebrile; All emergent sxs of Back Symptoms protocol r/o except "pain intensifies with coughing, sneezing or straining"; disp see within 24hrs; appt scheduled for 4/19 at 1015 with Dr.Bedsole Protocol(s) Used: Back Symptoms Recommended Outcome per Protocol: See Provider within 24 hours Reason for Outcome: Pain intensifies with coughing, sneezing or straining Care Advice: ~ Call provider if symptoms worsen or new symptoms develop. ~ Avoid heavy lifting, bending and twisting of the back, and prolonged sitting until evaluated by provider. Apply a cloth-covered cold or ice pack to the area for 20 minutes 4 to 8 times a day for relief of pain for the first 24-48 hours. After 24 to 48 hours of cold application, use a cloth-covered heat pack to the area for 20 minutes 3 to 4 times a day. ~Sleep on a moderately firm mattress. Try sleeping on back with a pillow placed under knees or sleep on side with knees bent and a pillow between knees. When lying on stomach, place pillow under the abdomen and pelvis; do not use a pillow for your head. ~Go to the ED if you have worsening pain, numbness or weakness of arms or legs, cannot walk, or have new unexplained changes in bladder or bowel function. Another adult should drive. ~ SYMPTOM / CONDITION MANAGEMENT

## 2012-06-03 ENCOUNTER — Telehealth: Payer: Self-pay

## 2012-06-03 NOTE — Telephone Encounter (Signed)
Pt called requesting results of Ucx done by Dr Ermalene Searing at [facility] 05/31/2012, please advise

## 2012-06-03 NOTE — Telephone Encounter (Signed)
Pt return call back gave md response.../lmb 

## 2012-06-03 NOTE — Telephone Encounter (Signed)
Urine cx negative

## 2012-06-03 NOTE — Telephone Encounter (Signed)
Tried calling pt @ # that was given was told have the wrong # this is the [facility] # and pls removed from pt contact pls. Called home # that was on file no answer LMOM RTC...William Sheppard

## 2012-08-20 ENCOUNTER — Encounter: Payer: Self-pay | Admitting: Internal Medicine

## 2012-08-20 MED ORDER — ALPRAZOLAM 0.5 MG PO TABS: 0.5000 mg | ORAL_TABLET | Freq: Every day | ORAL | Status: DC | PRN

## 2012-08-20 NOTE — Telephone Encounter (Signed)
Done hardcopy to [employer] for lower dose alprazolam, as this works for most people, robin should call and let you know when to pick up

## 2012-09-16 ENCOUNTER — Telehealth: Payer: Self-pay | Admitting: *Deleted

## 2012-09-16 MED ORDER — GLUCOSE BLOOD VI STRP: ORAL_STRIP | Status: DC

## 2012-09-16 NOTE — Telephone Encounter (Signed)
Done erx 

## 2012-09-16 NOTE — Telephone Encounter (Signed)
Pt called requesting rx for diabetic test strips.  There are none listed on his med list.  Please advise

## 2012-09-18 ENCOUNTER — Ambulatory Visit (INDEPENDENT_AMBULATORY_CARE_PROVIDER_SITE_OTHER): Payer: BC Managed Care – PPO | Admitting: Internal Medicine

## 2012-09-18 ENCOUNTER — Encounter: Payer: Self-pay | Admitting: Internal Medicine

## 2012-09-18 VITALS — BP 94/62 | HR 93 | Temp 98.3°F | Ht 74.0 in | Wt 223.2 lb

## 2012-09-18 DIAGNOSIS — R400 Somnolence: Secondary | ICD-10-CM

## 2012-09-18 DIAGNOSIS — R5383 Other fatigue: Secondary | ICD-10-CM | POA: Insufficient documentation

## 2012-09-18 DIAGNOSIS — R0789 Other chest pain: Secondary | ICD-10-CM | POA: Insufficient documentation

## 2012-09-18 DIAGNOSIS — R5381 Other malaise: Secondary | ICD-10-CM

## 2012-09-18 DIAGNOSIS — G4710 Hypersomnia, unspecified: Secondary | ICD-10-CM

## 2012-09-18 HISTORY — DX: Somnolence: R40.0

## 2012-09-18 MED ORDER — METFORMIN HCL 500 MG PO TABS: 500.0000 mg | ORAL_TABLET | Freq: Every day | ORAL | Status: DC

## 2012-09-18 MED ORDER — ALBUTEROL SULFATE HFA 108 (90 BASE) MCG/ACT IN AERS: 2.0000 | INHALATION_SPRAY | Freq: Four times a day (QID) | RESPIRATORY_TRACT | Status: DC | PRN

## 2012-09-18 MED ORDER — PRAVASTATIN SODIUM 20 MG PO TABS: 20.0000 mg | ORAL_TABLET | Freq: Every day | ORAL | Status: DC

## 2012-09-18 MED ORDER — LISINOPRIL 5 MG PO TABS: 5.0000 mg | ORAL_TABLET | Freq: Every day | ORAL | Status: DC

## 2012-09-18 NOTE — Patient Instructions (Signed)
Please take all new medication as prescribed if needed  - the inhaler Please continue all other medications as before, and refills have been done if requested. You will be contacted regarding the referral for: Pulmonary to see about sleep apnea Your testosterone level can be done with your next blood tests Please have the pharmacy call with any other med refills you may need.  Please remember to sign up for My Chart if you have not done so, as this will be important to you in the future with finding out test results, communicating by private email, and scheduling acute appointments online when needed.

## 2012-09-18 NOTE — Assessment & Plan Note (Signed)
stable overall by history and exam, recent data reviewed with pt, and pt to continue medical treatment as before,  to f/u any worsening symptoms or concerns Lab Results  Component Value Date   HGBA1C 6.6* 05/07/2012

## 2012-09-18 NOTE — Assessment & Plan Note (Signed)
?   osa - for pulm referral 

## 2012-09-18 NOTE — Assessment & Plan Note (Signed)
Also for testosterone level next visit,  to f/u any worsening symptoms or concerns

## 2012-09-18 NOTE — Telephone Encounter (Signed)
Called pt no answer LMOM strips sent to [facility]...lmb

## 2012-09-18 NOTE — Progress Notes (Signed)
  Subjective:    Patient ID: William Sheppard, male    DOB: 01/07/1971, 42 y.o.   MRN: [id number]  HPI  Here to f/u, after acute onset constricting type of chest discomfort, hard to inhale fully, was overnight in [facility] medical, stress test, and labs/cxr/ecg ok.  Told by inpt doctor not likely asthma.  No recent fever.  Dishcarged x 5 days, since then Pt denies chest pain, increased sob or doe, wheezing, orthopnea, PND, increased LE swelling, palpitations, dizziness or syncope.  Asked to come here after d/c.  Also has occasional sweating at night, wondering about low sugars, but no checking as ran out of strips until got a refill yesterday.  Does c/o ongoing fatigue, and has signficant daytime hypersomnolence. Past Medical History  Diagnosis Date  . Diabetes mellitus   . Chronic headaches   . Hyperlipidemia   . Non Hodgkin's lymphoma    No past surgical history on file.  reports that he has never smoked. He does not have any smokeless tobacco history on file. He reports that  drinks alcohol. He reports that he does not use illicit drugs. family history includes Diabetes in his other. Allergies  Allergen Reactions  . Metformin And Related Nausea Only   Current Outpatient Prescriptions on File Prior to Visit  Medication Sig Dispense Refill  . aspirin 81 MG EC tablet Take 1 tablet (81 mg total) by mouth daily. Swallow whole.  30 tablet  12  . glucose blood test strip Use as instructed  100 each  12   No current facility-administered medications on file prior to visit.   Review of Systems  Constitutional: Negative for unexpected weight change, or unusual diaphoresis  HENT: Negative for tinnitus.   Eyes: Negative for photophobia and visual disturbance.  Respiratory: Negative for choking and stridor.   Gastrointestinal: Negative for vomiting and blood in stool.  Genitourinary: Negative for hematuria and decreased urine volume.  Musculoskeletal: Negative for acute joint swelling Skin:  Negative for color change and wound.  Neurological: Negative for tremors and numbness other than noted  Psychiatric/Behavioral: Negative for decreased concentration or  hyperactivity.       Objective:   Physical Exam BP 94/62  Pulse 93  Temp(Src) 98.3 F (36.8 C) (Oral)  Ht 6\' 2"  (1.88 m)  Wt 223 lb 4 oz (101.266 kg)  BMI 28.65 kg/m2  SpO2 96% VS noted, not ill appaering Constitutional: Pt appears well-developed and well-nourished.  HENT: Head: NCAT.  Right Ear: External ear normal.  Left Ear: External ear normal.  Bilat tm's with mild erythema left > right.  Max sinus areas tender.  Pharynx with mild erythema, no exudate Eyes: Conjunctivae and EOM are normal. Pupils are equal, round, and reactive to light.  Neck: Normal range of motion. Neck supple.  Cardiovascular: Normal rate and regular rhythm.   Pulmonary/Chest: Effort normal and breath sounds normal.  - no rales or wheezing Neurological: Pt is alert. Not confused  Skin: Skin is warm. No erythema.  Psychiatric: Pt behavior is normal. Thought content normal.     Assessment & Plan:

## 2012-09-18 NOTE — Assessment & Plan Note (Addendum)
In retrospect seems ? Mild intermittent asthma, has evidence for mild allergies today on exam, for rx for Proair hfa prn,  to f/u any worsening symptoms or concerns

## 2012-09-27 ENCOUNTER — Encounter: Payer: Self-pay | Admitting: Internal Medicine

## 2012-09-30 ENCOUNTER — Encounter: Payer: Self-pay | Admitting: Internal Medicine

## 2012-09-30 DIAGNOSIS — O0000 Abdominal pregnancy without intrauterine pregnancy: Secondary | ICD-10-CM

## 2012-09-30 DIAGNOSIS — R079 Chest pain, unspecified: Secondary | ICD-10-CM

## 2012-10-23 ENCOUNTER — Ambulatory Visit (INDEPENDENT_AMBULATORY_CARE_PROVIDER_SITE_OTHER): Payer: BC Managed Care – PPO | Admitting: Pulmonary Disease

## 2012-10-23 ENCOUNTER — Encounter: Payer: Self-pay | Admitting: Pulmonary Disease

## 2012-10-23 VITALS — BP 112/78 | HR 79 | Temp 98.0°F | Ht 74.0 in | Wt 225.2 lb

## 2012-10-23 DIAGNOSIS — Z72821 Inadequate sleep hygiene: Secondary | ICD-10-CM

## 2012-10-23 DIAGNOSIS — G4710 Hypersomnia, unspecified: Secondary | ICD-10-CM

## 2012-10-23 DIAGNOSIS — R400 Somnolence: Secondary | ICD-10-CM

## 2012-10-23 DIAGNOSIS — F518 Other sleep disorders not due to a substance or known physiological condition: Secondary | ICD-10-CM

## 2012-10-23 HISTORY — DX: Inadequate sleep hygiene: Z72.821

## 2012-10-23 NOTE — Progress Notes (Signed)
Subjective:    Patient ID: William Sheppard, male    DOB: 03/27/1970, 42 y.o.   MRN: [id number]  HPI The patient is a 42 year old male who been asked to see for ongoing sleep issues.  The patient has been having problems with nonrestorative sleep as well as significant daytime sleepiness with inactivity.  He has been noted to have snoring, but no one has ever seen an abnormal breathing pattern during sleep.  He does note rare choking arousals.  He awakens 1-2 times a night, primarily to check his alarm clock to make sure that he is not oversleeping.  He typically will go to the bedroom around 10, but will not go to sleep until 11-1AM.  He gets up each morning at 5:30 to start his day, and does not feel rested upon arising.  His bed partner so never complained of kicking, and he denies symptoms of the restless leg syndrome.  He notes significant sleep pressure during the day with any period of inactivity, but does not have any issues in the evening watching television or movies.  He has some slight pressure driving longer distances.  He notes that his sleepiness issues started in college, but did not go back to his teenage years.  The patient states that his weight is neutral over the last few years, and his Epworth score today is 15.   Sleep Questionnaire What time do you typically go to bed?( Between what hours) 11p-1a 11p-1a at 1151 on 10/23/12 by Maisie Fus, CMA How long does it take you to fall asleep? 5-78mins 5-75mins at 1151 on 10/23/12 by Maisie Fus, CMA How many times during the night do you wake up? 2 2 at 1151 on 10/23/12 by Maisie Fus, CMA What time do you get out of bed to start your day? 0530 0530 at 1151 on 10/23/12 by Maisie Fus, CMA Do you drive or operate heavy machinery in your occupation? No No at 1151 on 10/23/12 by Maisie Fus, CMA How much has your weight changed (up or down) over the past two years? (In pounds) 2 lb (0.907 kg)2 lb (0.907 kg) +/- at  1151 on 10/23/12 by Maisie Fus, CMA Have you ever had a sleep study before? No No at 1151 on 10/23/12 by Maisie Fus, CMA Do you currently use CPAP? Do you wear oxygen at any time? No No at 1151 on 10/23/12 by Maisie Fus, CMA   Review of Systems  Constitutional: Negative for fever and unexpected weight change.  HENT: Negative for ear pain, nosebleeds, congestion, sore throat, rhinorrhea, sneezing, trouble swallowing, dental problem, postnasal drip and sinus pressure.   Eyes: Negative for redness and itching.  Respiratory: Positive for cough. Negative for chest tightness, shortness of breath and wheezing.   Cardiovascular: Negative for palpitations and leg swelling.  Gastrointestinal: Negative for nausea and vomiting.  Genitourinary: Negative for dysuria.  Musculoskeletal: Negative for joint swelling.  Skin: Negative for rash.  Neurological: Positive for headaches.  Hematological: Does not bruise/bleed easily.  Psychiatric/Behavioral: Negative for dysphoric mood. The patient is nervous/anxious.        Objective:   Physical Exam Constitutional:  Well developed, no acute distress  HENT:  Nares patent without discharge  Oropharynx without exudate, palate and uvula are mildly elongated.   Eyes:  Perrla, eomi, no scleral icterus  Neck:  No JVD, no TMG  Cardiovascular:  Normal rate, regular rhythm, no rubs or gallops.  No murmurs  Intact distal pulses  Pulmonary :  Normal breath sounds, no stridor or respiratory distress   No rales, rhonchi, or wheezing  Abdominal:  Soft, nondistended, bowel sounds present.  No tenderness noted.   Musculoskeletal:  No lower extremity edema noted.  Lymph Nodes:  No cervical lymphadenopathy noted  Skin:  No cyanosis noted  Neurologic:  Appears sleepy, but appropriate, moves all 4 extremities without obvious deficit.         Assessment & Plan:

## 2012-10-23 NOTE — Patient Instructions (Addendum)
Would work on improving your sleep hygiene.  Work on getting to sleep at a more reasonable hour to see if increasing your sleep time will resolve your issues. No reading, tv, or electronic devices in bed.  Would also avoid electronic devices after 10pm. If you continue to have nonrestorative sleep and daytime sleepiness, would recommend a sleep study.   Please call if you are continuing to have issues so we can arrange.

## 2012-10-23 NOTE — Assessment & Plan Note (Signed)
The patient has ongoing sleep pressure during the day, and most likely this is related to inadequate total sleep time.  However, I cannot exclude a possible nocturnal sleep disorder, especially with his nonrestorative sleep.  There is really nothing to see just a daytime sleep disorder such as narcolepsy or idiopathic hypersomnia, but these will need to be kept in mind going forward.  I have offered to schedule him for a sleep study for evaluation versus working on sleep hygiene first.  The patient would like to take a more conservative approach, and will call me if he does not improve with increasing his total sleep time.

## 2012-10-24 NOTE — Telephone Encounter (Signed)
Ok for refer endo - done per emr

## 2012-10-24 NOTE — Addendum Note (Signed)
Addended by: Corwin Levins on: 10/24/2012 09:44 AM   Modules accepted: Orders

## 2012-11-03 ENCOUNTER — Encounter: Payer: Self-pay | Admitting: Endocrinology

## 2012-11-03 ENCOUNTER — Ambulatory Visit (INDEPENDENT_AMBULATORY_CARE_PROVIDER_SITE_OTHER): Payer: BC Managed Care – PPO | Admitting: Endocrinology

## 2012-11-03 VITALS — BP 130/78 | HR 78 | Ht 74.0 in | Wt 222.0 lb

## 2012-11-03 DIAGNOSIS — R200 Anesthesia of skin: Secondary | ICD-10-CM

## 2012-11-03 DIAGNOSIS — R209 Unspecified disturbances of skin sensation: Secondary | ICD-10-CM

## 2012-11-03 HISTORY — DX: Anesthesia of skin: R20.0

## 2012-11-03 LAB — HEMOGLOBIN A1C: Hgb A1c MFr Bld: 6.8 % — ABNORMAL HIGH (ref 4.6–6.5)

## 2012-11-03 LAB — VITAMIN B12: Vitamin B-12: 408 pg/mL (ref 211–911)

## 2012-11-03 NOTE — Patient Instructions (Addendum)
good diet and exercise habits significanly improve the control of your diabetes.  please let me know if you wish to be referred to a dietician.  high blood sugar is very risky to your health.  you should see an eye doctor every year.  You are at higher than average risk for pneumonia and hepatitis-B.  You should be vaccinated against both.   controlling your blood pressure and cholesterol drastically reduces the damage diabetes does to your body.  this also applies to quitting smoking.  please discuss these with your doctor.  check your blood sugar once a day.  vary the time of day when you check, between before the 3 meals, and at bedtime.  also check if you have symptoms of your blood sugar being too high or too low.  please keep a record of the readings and bring it to your next appointment here.  please call us sooner if your blood sugar goes below 70, or if you have a lot of readings over 200.  blood tests are being requested for you today.  We'll contact you with results. If it is high, we'll increase the metformin, or add "welchol." Please come back for a follow-up appointment in 4 months.

## 2012-11-03 NOTE — Progress Notes (Signed)
Subjective:    Patient ID: William Sheppard, male    DOB: [birth year], 42 y.o.   MRN: [id number]  HPI pt states DM was dx'ed in 2012; he has mild if any neuropathy of the lower extremities; he is unaware of any associated chronic complications.  he has never been on insulin.  pt says his diet and exercise are much better recently.  However, he says cbg's have increased to approx 160.  He has slight tingling of the legs, but no assoc pain.   Past Medical History  Diagnosis Date  . Diabetes mellitus   . Chronic headaches   . Hyperlipidemia   . Non Hodgkin's lymphoma     Past Surgical History  Procedure Laterality Date  . Portacath placement  1999  . Wisdom tooth extraction      History   Social History  . Marital Status: Married    Spouse Name: N/A    Number of Children: N/A  . Years of Education: masters   Occupational History  . Naval architect   Social History Main Topics  . Smoking status: Never Smoker   . Smokeless tobacco: Not on file  . Alcohol Use: Yes     Comment: social  . Drug Use: No  . Sexual Activity: Not on file   Other Topics Concern  . Not on file   Social History Narrative  . No narrative on file    Current Outpatient Prescriptions on File Prior to Visit  Medication Sig Dispense Refill  . aspirin 81 MG EC tablet Take 1 tablet (81 mg total) by mouth daily. Swallow whole.  30 tablet  12  . lisinopril (PRINIVIL,ZESTRIL) 5 MG tablet Take 1 tablet (5 mg total) by mouth daily.  30 tablet  11  . metFORMIN (GLUCOPHAGE) 500 MG tablet Take 1 tablet (500 mg total) by mouth daily.  30 tablet  11  . pravastatin (PRAVACHOL) 20 MG tablet Take 1 tablet (20 mg total) by mouth daily.  30 tablet  11   No current facility-administered medications on file prior to visit.    Allergies  Allergen Reactions  . Metformin And Related Nausea Only    Family History  Problem Relation Age of Onset  . Diabetes Other   DM: none  BP 130/78  Pulse 78  Ht 6'  2" (1.88 m)  Wt 222 lb (100.699 kg)  BMI 28.49 kg/m2  SpO2 98%  Review of Systems denies weight loss, blurry vision, headache, chest pain, sob, n/v, urinary frequency, cramps, excessive diaphoresis, memory loss, depression, hypoglycemia, rhinorrhea, and easy bruising.      Objective:   Physical Exam VS: see vs page GEN: no distress HEAD: head: no deformity eyes: no periorbital swelling, no proptosis external nose and ears are normal mouth: no lesion seen NECK: supple, thyroid is not enlarged CHEST WALL: no deformity LUNGS: clear to auscultation BREASTS:  No gynecomastia CV: reg rate and rhythm, no murmur ABD: abdomen is soft, nontender.  no hepatosplenomegaly.  not distended.  no hernia MUSCULOSKELETAL: muscle bulk and strength are grossly normal.  no obvious joint swelling.  gait is normal and steady.   PULSES: no carotid bruit SKIN:  Normal texture and temperature.  No rash or suspicious lesion is visible.   NODES:  None palpable at the neck PSYCH: alert, oriented x3.  Does not appear anxious nor depressed.   Lab Results  Component Value Date   HGBA1C 6.8* 11/03/2012      Assessment & Plan:  Type 2 DM: well-controlled.  Given his lack of family history and lean body habitus, he may be evolving type 1 DM.   Tingling: prob not related to DM. Dyslipidemia: in this setting, welchol would be a good med to add-on for DM.

## 2012-11-07 ENCOUNTER — Ambulatory Visit: Payer: BC Managed Care – PPO | Admitting: Internal Medicine

## 2012-11-07 ENCOUNTER — Encounter: Payer: Self-pay | Admitting: Internal Medicine

## 2012-11-07 ENCOUNTER — Ambulatory Visit (INDEPENDENT_AMBULATORY_CARE_PROVIDER_SITE_OTHER): Payer: BC Managed Care – PPO | Admitting: Internal Medicine

## 2012-11-07 VITALS — BP 112/78 | HR 76 | Temp 99.2°F | Ht 74.0 in | Wt 220.2 lb

## 2012-11-07 DIAGNOSIS — R209 Unspecified disturbances of skin sensation: Secondary | ICD-10-CM

## 2012-11-07 DIAGNOSIS — Z0000 Encounter for general adult medical examination without abnormal findings: Secondary | ICD-10-CM

## 2012-11-07 DIAGNOSIS — IMO0001 Reserved for inherently not codable concepts without codable children: Secondary | ICD-10-CM

## 2012-11-07 DIAGNOSIS — R200 Anesthesia of skin: Secondary | ICD-10-CM

## 2012-11-07 DIAGNOSIS — E785 Hyperlipidemia, unspecified: Secondary | ICD-10-CM

## 2012-11-07 NOTE — Progress Notes (Signed)
  Subjective:    Patient ID: William Sheppard, male    DOB: 03-May-1970, 42 y.o.   MRN: [id number]  HPI  Here to f/u; overall doing ok,  Pt denies chest pain, increased sob or doe, wheezing, orthopnea, PND, increased LE swelling, palpitations, dizziness or syncope.  Pt denies polydipsia, polyuria, or low sugar symptoms such as weakness or confusion improved with po intake.  Pt denies new neurological symptoms such as new headache, or facial or extremity weakness or numbness.   Pt states overall good compliance with meds, has been trying to follow lower cholesterol, diabetic diet, with wt overall stable,  but little exercise however. Still has some tingling in the arms but mild, b12 recently normal Past Medical History  Diagnosis Date  . Diabetes mellitus   . Chronic headaches   . Hyperlipidemia   . Non Hodgkin's lymphoma    Past Surgical History  Procedure Laterality Date  . Portacath placement  1999  . Wisdom tooth extraction      reports that he has never smoked. He does not have any smokeless tobacco history on file. He reports that  drinks alcohol. He reports that he does not use illicit drugs. family history includes Diabetes in his other. Allergies  Allergen Reactions  . Metformin And Related Nausea Only   Current Outpatient Prescriptions on File Prior to Visit  Medication Sig Dispense Refill  . aspirin 81 MG EC tablet Take 1 tablet (81 mg total) by mouth daily. Swallow whole.  30 tablet  12  . glucose blood test strip 1 each by Other route daily. And lancets 1/day 250.00      . lisinopril (PRINIVIL,ZESTRIL) 5 MG tablet Take 1 tablet (5 mg total) by mouth daily.  30 tablet  11  . metFORMIN (GLUCOPHAGE-XR) 500 MG 24 hr tablet Take 500 mg by mouth daily with breakfast.      . pravastatin (PRAVACHOL) 20 MG tablet Take 1 tablet (20 mg total) by mouth daily.  30 tablet  11   No current facility-administered medications on file prior to visit.   Review of Systems  Constitutional:  Negative for unexpected weight change, or unusual diaphoresis  HENT: Negative for tinnitus.   Eyes: Negative for photophobia and visual disturbance.  Respiratory: Negative for choking and stridor.   Gastrointestinal: Negative for vomiting and blood in stool.  Genitourinary: Negative for hematuria and decreased urine volume.  Musculoskeletal: Negative for acute joint swelling Skin: Negative for color change and wound.  Neurological: Negative for tremors and numbness other than noted  Psychiatric/Behavioral: Negative for decreased concentration or  hyperactivity.       Objective:   Physical Exam BP 112/78  Pulse 76  Temp(Src) 99.2 F (37.3 C) (Oral)  Ht 6\' 2"  (1.88 m)  Wt 220 lb 4 oz (99.905 kg)  BMI 28.27 kg/m2  SpO2 95% VS noted,  Constitutional: Pt appears well-developed and well-nourished.  HENT: Head: NCAT.  Right Ear: External ear normal.  Left Ear: External ear normal.  Eyes: Conjunctivae and EOM are normal. Pupils are equal, round, and reactive to light.  Neck: Normal range of motion. Neck supple.  Cardiovascular: Normal rate and regular rhythm.   Pulmonary/Chest: Effort normal and breath sounds normal.  Abd:  Soft, NT, non-distended, + BS Neurological: Pt is alert. Not confused , sens intact, motor 5/5 Skin: Skin is warm. No erythema.  Psychiatric: Pt behavior is normal. Thought content normal.         Assessment & Plan:

## 2012-11-07 NOTE — Patient Instructions (Signed)
Please continue all other medications as before, and refills have been done if requested. Please have the pharmacy call with any other refills you may need. Please continue your efforts at being more active, low cholesterol diet, and weight control. Please return if you change your mind about the flu shot  Please remember to sign up for My Chart if you have not done so, as this will be important to you in the future with finding out test results, communicating by private email, and scheduling acute appointments online when needed.  Please return in 6 months, or sooner if needed, with Lab testing done 3-5 days before

## 2012-11-09 NOTE — Assessment & Plan Note (Signed)
Recent b12 ok, exam bening,  to f/u any worsening symptoms or concerns

## 2012-11-09 NOTE — Assessment & Plan Note (Signed)
stable overall by history and exam, recent data reviewed with pt, and pt to continue medical treatment as before,  to f/u any worsening symptoms or concerns Lab Results  Component Value Date   LDLCALC 84 05/07/2012

## 2012-11-09 NOTE — Assessment & Plan Note (Signed)
stable overall by history and exam, recent data reviewed with pt, and pt to continue medical treatment as before,  to f/u any worsening symptoms or concerns Lab Results  Component Value Date   HGBA1C 6.8* 11/03/2012

## 2012-12-18 ENCOUNTER — Other Ambulatory Visit: Payer: Self-pay

## 2013-01-20 NOTE — Addendum Note (Signed)
Addended by: Corwin Levins on: 01/20/2013 10:31 AM   Modules accepted: Orders

## 2013-01-20 NOTE — Telephone Encounter (Signed)
Ok for card referral

## 2013-01-21 ENCOUNTER — Ambulatory Visit (INDEPENDENT_AMBULATORY_CARE_PROVIDER_SITE_OTHER): Payer: BC Managed Care – PPO | Admitting: Cardiology

## 2013-01-21 VITALS — BP 122/72 | HR 80 | Ht 74.0 in | Wt 219.0 lb

## 2013-01-21 DIAGNOSIS — R079 Chest pain, unspecified: Secondary | ICD-10-CM

## 2013-01-21 NOTE — Progress Notes (Signed)
Patient ID: HARLOW CARRIZALES, male   DOB: December 19, 1970, 42 y.o.   MRN: [id number]     Patient Name: William Sheppard Date of Encounter: 01/21/2013  Primary Care Provider:  Oliver Barre, MD Primary Cardiologist:  Tobias Alexander, H   Problem List   Past Medical History  Diagnosis Date  . Diabetes mellitus   . Chronic headaches   . Hyperlipidemia   . Non Hodgkin's lymphoma    Past Surgical History  Procedure Laterality Date  . Portacath placement  1999  . Wisdom tooth extraction      Allergies  Allergies  Allergen Reactions  . Metformin And Related Nausea Only    HPI  42 year old gentleman with prior medical history of non-insulin-dependent diabetes mellitus, hyperlipidemia, hypertension and non-Hodgkin lymphoma treated 15 years ago with chemotherapy (no radiation therapy). The patient is experiencing chest pains that are retrosternal and on the left side of his chest are related not necessarily to exertion but to stress. The pain is described as nagging. The patient experiences this chest pain almost daily in a situation like driving to work and sometimes it's associated with shortness of breath. The patient went to an ER in [place] in August and states that stress echocardiogram was performed and was normal. He walks about 3 miles a day and doesn't experience chest pain. He denies palpitations or syncope.  Home Medications  Prior to Admission medications   Medication Sig Start Date End Date Taking? Authorizing Provider  aspirin 81 MG EC tablet Take 1 tablet (81 mg total) by mouth daily. Swallow whole. 05/07/12  Yes Corwin Levins, MD  glucose blood test strip 1 each by Other route daily. And lancets 1/day 250.00 09/16/12  Yes Corwin Levins, MD  lisinopril (PRINIVIL,ZESTRIL) 5 MG tablet Take 1 tablet (5 mg total) by mouth daily. 09/18/12  Yes Corwin Levins, MD  metFORMIN (GLUCOPHAGE-XR) 500 MG 24 hr tablet Take 500 mg by mouth daily with breakfast.   Yes Historical Provider, MD    pravastatin (PRAVACHOL) 20 MG tablet Take 1 tablet (20 mg total) by mouth daily. 09/18/12 09/18/13 Yes Corwin Levins, MD   Family History  Family History  Problem Relation Age of Onset  . Diabetes Other     Social History  History   Social History  . Marital Status: Married    Spouse Name: N/A    Number of Children: N/A  . Years of Education: masters   Occupational History  . Naval architect   Social History Main Topics  . Smoking status: Never Smoker   . Smokeless tobacco: Not on file  . Alcohol Use: Yes     Comment: social  . Drug Use: No  . Sexual Activity: Not on file   Other Topics Concern  . Not on file   Social History Narrative  . No narrative on file     Review of Systems, as per HPI, otherwise negative General:  No chills, fever, night sweats or weight changes.  Cardiovascular:  No chest pain, dyspnea on exertion, edema, orthopnea, palpitations, paroxysmal nocturnal dyspnea. Dermatological: No rash, lesions/masses Respiratory: No cough, dyspnea Urologic: No hematuria, dysuria Abdominal:   No nausea, vomiting, diarrhea, bright red blood per rectum, melena, or hematemesis Neurologic:  No visual changes, wkns, changes in mental status. All other systems reviewed and are otherwise negative except as noted above.  Physical Exam  Blood pressure 122/72, pulse 80, height 6\' 2"  (1.88 m), weight 219 lb (99.338 kg).  General:  Pleasant, NAD Psych: Normal affect. Neuro: Alert and oriented X 3. Moves all extremities spontaneously. HEENT: Normal  Neck: Supple without bruits or JVD. Lungs:  Resp regular and unlabored, CTA. Heart: RRR no s3, s4, or murmurs. Abdomen: Soft, non-tender, non-distended, BS + x 4.  Extremities: No clubbing, cyanosis or edema. DP/PT/Radials 2+ and equal bilaterally.  Labs:  No results found for this basename: CKTOTAL, CKMB, TROPONINI,  in the last 72 hours Lab Results  Component Value Date   WBC 4.8 05/07/2012   HGB 13.7  05/07/2012   HCT 42.7 05/07/2012   MCV 69.1 Repeated and verified X2.* 05/07/2012   PLT 222.0 05/07/2012   No results found for this basename: NA, K, CL, CO2, BUN, CREATININE, CALCIUM, LABALBU, PROT, BILITOT, ALKPHOS, ALT, AST, GLUCOSE,  in the last 168 hours Lab Results  Component Value Date   CHOL 145 05/07/2012   HDL 40.20 05/07/2012   LDLCALC 84 05/07/2012   TRIG 106.0 05/07/2012   Accessory Clinical Findings  Echocardiogram - none in our record  ECG - normal sinus rhythm, 84 beats per minute, left axis deviation, incomplete right bundle branch block, nonspecific T waves abnormalities, abnormal EKG   Assessment & Plan  42 year old gentleman  1. Atypical chest pain - however considering history of diabetes puts him at high risk category. The patient stated he had been to stress test. We will proceed with coronary CT that will give [place] the ultimate answer about his atherosclerotic burden.  2. Hyperlipidemia - currently at goal with the Pravachol 20 mg daily, considering patient has diabetes he should be on a moderate dose of a high potency statin. We'll reevaluate that once when you know the results of coronary CT.  3. Diabetes non-insulin-dependent - follow with primary care physician, hemoglobin A1c 6.8  Followup in 2 weeks  Lars Masson, MD, [facility] 01/21/2013, 12:01 PM

## 2013-01-21 NOTE — Patient Instructions (Addendum)
**Note De-identified Charmine Bockrath Obfuscation** Your physician has requested that you have cardiac CT. Cardiac computed tomography (CT) is a painless test that uses an x-ray machine to take clear, detailed pictures of your heart. For further information please visit www.[email or web address]. Please follow instruction sheet as given.  Your physician recommends that you schedule a follow-up appointment in: after test 

## 2013-01-22 NOTE — Addendum Note (Signed)
Addended by: Demetrios Loll on: 01/22/2013 10:13 AM   Modules accepted: Orders

## 2013-01-23 ENCOUNTER — Encounter: Payer: Self-pay | Admitting: *Deleted

## 2013-01-26 ENCOUNTER — Other Ambulatory Visit: Payer: BC Managed Care – PPO

## 2013-01-27 ENCOUNTER — Other Ambulatory Visit (INDEPENDENT_AMBULATORY_CARE_PROVIDER_SITE_OTHER): Payer: BC Managed Care – PPO

## 2013-01-27 DIAGNOSIS — E785 Hyperlipidemia, unspecified: Secondary | ICD-10-CM

## 2013-01-28 LAB — BASIC METABOLIC PANEL
BUN: 16 mg/dL (ref 6–23)
CO2: 28 mEq/L (ref 19–32)
Chloride: 109 mEq/L (ref 96–112)
Creatinine, Ser: 1.2 mg/dL (ref 0.4–1.5)
GFR: 87.42 mL/min (ref >60.00)
Glucose, Bld: 88 mg/dL (ref 70–99)

## 2013-02-11 ENCOUNTER — Ambulatory Visit (HOSPITAL_COMMUNITY)
Admission: RE | Admit: 2013-02-11 | Discharge: 2013-02-11 | Disposition: A | Discharge status: Home / Self Care | Payer: BC Managed Care – PPO | Source: Ambulatory Visit | Attending: Cardiology | Admitting: Cardiology

## 2013-02-11 DIAGNOSIS — R079 Chest pain, unspecified: Secondary | ICD-10-CM | POA: Insufficient documentation

## 2013-02-11 MED ORDER — METOPROLOL TARTRATE 1 MG/ML IV SOLN
5.0000 mg | INTRAVENOUS | Status: DC | PRN
  Administered 2013-02-11 (×2): 5 mg via INTRAVENOUS

## 2013-02-11 MED ORDER — METOPROLOL TARTRATE 1 MG/ML IV SOLN
INTRAVENOUS | Status: AC
  Administered 2013-02-11: 5 mg via INTRAVENOUS
  Filled 2013-02-11: qty 5

## 2013-02-11 MED ORDER — NITROGLYCERIN 0.4 MG SL SUBL
0.4000 mg | SUBLINGUAL_TABLET | Freq: Once | SUBLINGUAL | Status: AC
  Administered 2013-02-11: 0.4 mg via SUBLINGUAL

## 2013-02-11 MED ORDER — NITROGLYCERIN 0.4 MG SL SUBL
SUBLINGUAL_TABLET | SUBLINGUAL | Status: AC
  Administered 2013-02-11: 0.4 mg via SUBLINGUAL
  Filled 2013-02-11: qty 25

## 2013-02-11 MED ORDER — IOHEXOL 350 MG/ML SOLN
100.0000 mL | Freq: Once | INTRAVENOUS | Status: AC | PRN
  Administered 2013-02-11: 80 mL via INTRAVENOUS

## 2013-02-16 ENCOUNTER — Telehealth: Payer: Self-pay

## 2013-02-16 NOTE — Telephone Encounter (Signed)
Patient called, Dr.Nelson reviewed Coronary CT which was normal.

## 2013-02-27 ENCOUNTER — Telehealth: Payer: Self-pay | Admitting: Cardiology

## 2013-02-27 NOTE — Telephone Encounter (Signed)
**Note De-Identified Bernita Beckstrom Obfuscation** Pt given normal Coronary Ct results per Dr Meda Coffee, he verbalized understanding.

## 2013-02-27 NOTE — Telephone Encounter (Signed)
New message ° ° ° ° ° ° ° ° ° °Pt returning nurses call °

## 2013-05-08 ENCOUNTER — Other Ambulatory Visit (INDEPENDENT_AMBULATORY_CARE_PROVIDER_SITE_OTHER): Payer: BC Managed Care – PPO

## 2013-05-08 ENCOUNTER — Encounter: Payer: Self-pay | Admitting: Internal Medicine

## 2013-05-08 ENCOUNTER — Ambulatory Visit (INDEPENDENT_AMBULATORY_CARE_PROVIDER_SITE_OTHER): Payer: BC Managed Care – PPO | Admitting: Internal Medicine

## 2013-05-08 VITALS — BP 110/80 | HR 99 | Temp 98.2°F | Ht 74.0 in | Wt 223.4 lb

## 2013-05-08 DIAGNOSIS — E1165 Type 2 diabetes mellitus with hyperglycemia: Secondary | ICD-10-CM

## 2013-05-08 DIAGNOSIS — IMO0001 Reserved for inherently not codable concepts without codable children: Secondary | ICD-10-CM

## 2013-05-08 DIAGNOSIS — R5381 Other malaise: Secondary | ICD-10-CM

## 2013-05-08 DIAGNOSIS — Z0000 Encounter for general adult medical examination without abnormal findings: Secondary | ICD-10-CM

## 2013-05-08 DIAGNOSIS — E785 Hyperlipidemia, unspecified: Secondary | ICD-10-CM

## 2013-05-08 DIAGNOSIS — R5383 Other fatigue: Principal | ICD-10-CM

## 2013-05-08 LAB — CBC WITH DIFFERENTIAL/PLATELET
BASOS PCT: 0.8 % (ref 0.0–3.0)
Basophils Absolute: 0 10*3/uL (ref 0.0–0.1)
Eosinophils Absolute: 0 10*3/uL (ref 0.0–0.7)
Eosinophils Relative: 1 % (ref 0.0–5.0)
HEMATOCRIT: 41.4 % (ref 39.0–52.0)
Hemoglobin: 13.1 g/dL (ref 13.0–17.0)
Lymphocytes Relative: 48.6 % — ABNORMAL HIGH (ref 12.0–46.0)
Lymphs Abs: 2 10*3/uL (ref 0.7–4.0)
MCHC: 31.7 g/dL (ref 30.0–36.0)
MCV: 70.2 fl — AB (ref 78.0–100.0)
MONO ABS: 0.3 10*3/uL (ref 0.1–1.0)
Monocytes Relative: 7.5 % (ref 3.0–12.0)
Neutro Abs: 1.7 10*3/uL (ref 1.4–7.7)
Neutrophils Relative %: 42.1 % — ABNORMAL LOW (ref 43.0–77.0)
PLATELETS: 234 10*3/uL (ref 150.0–400.0)
RBC: 5.9 Mil/uL — ABNORMAL HIGH (ref 4.22–5.81)
RDW: 14.8 % — ABNORMAL HIGH (ref 11.5–14.6)
WBC: 4.1 10*3/uL — AB (ref 4.5–10.5)

## 2013-05-08 LAB — URINALYSIS, ROUTINE W REFLEX MICROSCOPIC
BILIRUBIN URINE: NEGATIVE
Ketones, ur: NEGATIVE
Leukocytes, UA: NEGATIVE
Nitrite: NEGATIVE
Specific Gravity, Urine: >=1.03 — AB (ref 1.000–1.030)
TOTAL PROTEIN, URINE-UPE24: NEGATIVE
URINE GLUCOSE: NEGATIVE
Urobilinogen, UA: 0.2 (ref 0.0–1.0)
pH: 6 (ref 5.0–8.0)

## 2013-05-08 LAB — MICROALBUMIN / CREATININE URINE RATIO
CREATININE, U: 239.8 mg/dL
Microalb Creat Ratio: 1.9 mg/g (ref 0.0–30.0)
Microalb, Ur: 4.6 mg/dL — ABNORMAL HIGH (ref 0.0–1.9)

## 2013-05-08 LAB — HEPATIC FUNCTION PANEL
ALK PHOS: 50 U/L (ref 39–117)
ALT: 45 U/L (ref 0–53)
AST: 22 U/L (ref 0–37)
Albumin: 4.5 g/dL (ref 3.5–5.2)
BILIRUBIN TOTAL: 0.5 mg/dL (ref 0.3–1.2)
Bilirubin, Direct: 0 mg/dL (ref 0.0–0.3)
Total Protein: 7.7 g/dL (ref 6.0–8.3)

## 2013-05-08 LAB — LIPID PANEL
Cholesterol: 151 mg/dL (ref 0–200)
HDL: 44.3 mg/dL (ref >39.00)
LDL CALC: 81 mg/dL (ref 0–99)
Total CHOL/HDL Ratio: 3
Triglycerides: 129 mg/dL (ref 0.0–149.0)
VLDL: 25.8 mg/dL (ref 0.0–40.0)

## 2013-05-08 LAB — TESTOSTERONE: TESTOSTERONE: 280.68 ng/dL — AB (ref 350.00–890.00)

## 2013-05-08 LAB — HEMOGLOBIN A1C: Hgb A1c MFr Bld: 7 % — ABNORMAL HIGH (ref 4.6–6.5)

## 2013-05-08 LAB — PSA: PSA: 0.36 ng/mL (ref 0.10–4.00)

## 2013-05-08 LAB — TSH: TSH: 0.75 u[IU]/mL (ref 0.35–5.50)

## 2013-05-08 NOTE — Assessment & Plan Note (Signed)
stable overall by history and exam, recent data reviewed with pt, and pt to continue medical treatment as before,  to f/u any worsening symptoms or concerns Lab Results  Component Value Date   HGBA1C 7.0* 05/08/2013

## 2013-05-08 NOTE — Patient Instructions (Signed)
Please continue all other medications as before, and refills have been done if requested. Please have the pharmacy call with any other refills you may need.  Please continue your efforts at being more active, low cholesterol diet, and weight control.  You are otherwise up to date with prevention measures today.  Please go to the LAB in the Basement (turn left off the elevator) for the tests to be done today  You will be contacted by phone if any changes need to be made immediately.  Otherwise, you will receive a letter about your results with an explanation, but please check with MyChart first.  Please return in 6 months, or sooner if needed 

## 2013-05-08 NOTE — Progress Notes (Signed)
Pre visit review using our clinic review tool, if applicable. No additional management support is needed unless otherwise documented below in the visit note. 

## 2013-05-08 NOTE — Progress Notes (Signed)
Subjective:    Patient ID: William Sheppard, male    DOB: 1970-03-08, 43 y.o.   MRN: [id number]  HPI  Here for wellness and f/u;  Overall doing ok;  Pt denies CP, worsening SOB, DOE, wheezing, orthopnea, PND, worsening LE edema, palpitations, dizziness or syncope.  Pt denies neurological change such as new headache, facial or extremity weakness.  Pt denies polydipsia, polyuria, or low sugar symptoms. Pt states overall good compliance with treatment and medications, good tolerability, and has been trying to follow lower cholesterol diet.  Pt denies worsening depressive symptoms, suicidal ideation or panic. No fever, night sweats, wt loss, loss of appetite, or other constitutional symptoms.  Pt states good ability with ADL's, has low fall risk, home safety reviewed and adequate, no other significant changes in hearing or vision, and only occasionally active with exercise.  No new complaints. Wt overall down several lbs but plans to lose several more lbs, with goal 210 Past Medical History  Diagnosis Date  . Diabetes mellitus   . Chronic headaches   . Hyperlipidemia   . Non Hodgkin's lymphoma    Past Surgical History  Procedure Laterality Date  . Portacath placement  1999  . Wisdom tooth extraction      reports that he has never smoked. He does not have any smokeless tobacco history on file. He reports that he drinks alcohol. He reports that he does not use illicit drugs. family history includes Diabetes in his other. Allergies  Allergen Reactions  . Metformin And Related Nausea Only   Current Outpatient Prescriptions on File Prior to Visit  Medication Sig Dispense Refill  . aspirin 81 MG EC tablet Take 1 tablet (81 mg total) by mouth daily. Swallow whole.  30 tablet  12  . glucose blood test strip 1 each by Other route daily. And lancets 1/day 250.00      . lisinopril (PRINIVIL,ZESTRIL) 5 MG tablet Take 1 tablet (5 mg total) by mouth daily.  30 tablet  11  . metFORMIN (GLUCOPHAGE-XR) 500 MG  24 hr tablet Take 500 mg by mouth daily with breakfast.      . pravastatin (PRAVACHOL) 20 MG tablet Take 1 tablet (20 mg total) by mouth daily.  30 tablet  11   No current facility-administered medications on file prior to visit.   Review of Systems Constitutional: Negative for diaphoresis, activity change, appetite change or unexpected weight change.  HENT: Negative for hearing loss, ear pain, facial swelling, mouth sores and neck stiffness.   Eyes: Negative for pain, redness and visual disturbance.  Respiratory: Negative for shortness of breath and wheezing.   Cardiovascular: Negative for chest pain and palpitations.  Gastrointestinal: Negative for diarrhea, blood in stool, abdominal distention or other pain Genitourinary: Negative for hematuria, flank pain or change in urine volume.  Musculoskeletal: Negative for myalgias and joint swelling.  Skin: Negative for color change and wound.  Neurological: Negative for syncope and numbness. other than noted Hematological: Negative for adenopathy.  Psychiatric/Behavioral: Negative for hallucinations, self-injury, decreased concentration and agitation.      Objective:   Physical Exam BP 110/80  Pulse 99  Temp(Src) 98.2 F (36.8 C) (Oral)  Ht 6\' 2"  (1.88 m)  Wt 223 lb 6 oz (101.322 kg)  BMI 28.67 kg/m2  SpO2 97% VS noted,  Constitutional: Pt is oriented to person, place, and time. Appears well-developed and well-nourished.  Head: Normocephalic and atraumatic.  Right Ear: External ear normal.  Left Ear: External ear normal.  Nose:  Nose normal.  Mouth/Throat: Oropharynx is clear and moist.  Eyes: Conjunctivae and EOM are normal. Pupils are equal, round, and reactive to light.  Neck: Normal range of motion. Neck supple. No JVD present. No tracheal deviation present.  Cardiovascular: Normal rate, regular rhythm, normal heart sounds and intact distal pulses.   Pulmonary/Chest: Effort normal and breath sounds normal.  Abdominal: Soft.  Bowel sounds are normal. There is no tenderness. No HSM  Musculoskeletal: Normal range of motion. Exhibits no edema.  Lymphadenopathy:  Has no cervical adenopathy.  Neurological: Pt is alert and oriented to person, place, and time. Pt has normal reflexes. No cranial nerve deficit.  Skin: Skin is warm and dry. No rash noted.  Psychiatric:  Has  normal mood and affect. Behavior is normal.     Assessment & Plan:

## 2013-05-08 NOTE — Assessment & Plan Note (Signed)

## 2013-05-25 ENCOUNTER — Telehealth: Payer: Self-pay

## 2013-05-25 NOTE — Telephone Encounter (Signed)
Message ----- From: Loney Laurence Sent: 05/25/2013 3:29 PM To: [facility] Triage Subject:   RE: Questionnaire   Good afternoon Dr. Meda Coffee, I know I went threw some tests. Currently, I been experiencing some fast irregular heart beats at night mostly. Maybe a little dizziness, I was told it's nothing to be concerned about. So, I was wondering what could it be? Just want to proactive.

## 2013-05-27 ENCOUNTER — Other Ambulatory Visit: Payer: Self-pay

## 2013-05-27 DIAGNOSIS — R000 Tachycardia, unspecified: Secondary | ICD-10-CM

## 2013-05-27 DIAGNOSIS — I499 Cardiac arrhythmia, unspecified: Secondary | ICD-10-CM

## 2013-05-29 NOTE — Telephone Encounter (Signed)
**Note De-Identified William Sheppard Obfuscation** The pt is coming in today @ 3 pm for a 48 hour holter monitor per Dr Meda Coffee.

## 2013-06-02 ENCOUNTER — Encounter (INDEPENDENT_AMBULATORY_CARE_PROVIDER_SITE_OTHER): Payer: BC Managed Care – PPO

## 2013-06-02 ENCOUNTER — Encounter: Payer: Self-pay | Admitting: *Deleted

## 2013-06-02 DIAGNOSIS — R000 Tachycardia, unspecified: Secondary | ICD-10-CM

## 2013-06-02 DIAGNOSIS — I499 Cardiac arrhythmia, unspecified: Secondary | ICD-10-CM

## 2013-06-02 NOTE — Progress Notes (Signed)
Patient ID: William Sheppard, male   DOB: 03-Jun-1970, 43 y.o.   MRN: [id number] E-Cardio 48 hour holter monitor applied to patient.

## 2013-06-09 ENCOUNTER — Telehealth: Payer: Self-pay | Admitting: *Deleted

## 2013-06-09 NOTE — Telephone Encounter (Signed)
Called pt to let him know that Dr Meda Coffee reviewed his 48 hour holter monitor results. Per Dr Meda Coffee the pt has frequent PACs no arrhythmias. No treatment necessary.  LMTCB.  Will continue to follow-up.

## 2013-06-09 NOTE — Telephone Encounter (Signed)
Pt called back. Explained to pt that Dr Meda Coffee reviewed holter monitor results. Holter revealed frequent PACs with no arrhythmias. Per Dr Meda Coffee no treatment is necessary.  Told pt what PACs were.  Told pt if he has any problems with chest pain or SOB to contact the office or call EMS.  Pt verbalized understanding and pleased with follow-up.

## 2013-09-25 ENCOUNTER — Other Ambulatory Visit: Payer: Self-pay | Admitting: Internal Medicine

## 2013-11-04 ENCOUNTER — Encounter: Payer: Self-pay | Admitting: Internal Medicine

## 2013-11-04 ENCOUNTER — Telehealth: Payer: Self-pay | Admitting: Cardiology

## 2013-11-04 ENCOUNTER — Ambulatory Visit (INDEPENDENT_AMBULATORY_CARE_PROVIDER_SITE_OTHER): Payer: BC Managed Care – PPO | Admitting: Internal Medicine

## 2013-11-04 VITALS — BP 108/68 | HR 98 | Temp 98.5°F | Ht 74.0 in | Wt 222.0 lb

## 2013-11-04 DIAGNOSIS — R079 Chest pain, unspecified: Secondary | ICD-10-CM

## 2013-11-04 DIAGNOSIS — E1165 Type 2 diabetes mellitus with hyperglycemia: Secondary | ICD-10-CM

## 2013-11-04 DIAGNOSIS — IMO0001 Reserved for inherently not codable concepts without codable children: Secondary | ICD-10-CM

## 2013-11-04 DIAGNOSIS — E291 Testicular hypofunction: Secondary | ICD-10-CM

## 2013-11-04 DIAGNOSIS — R7989 Other specified abnormal findings of blood chemistry: Secondary | ICD-10-CM | POA: Insufficient documentation

## 2013-11-04 DIAGNOSIS — E785 Hyperlipidemia, unspecified: Secondary | ICD-10-CM

## 2013-11-04 DIAGNOSIS — R0789 Other chest pain: Secondary | ICD-10-CM

## 2013-11-04 HISTORY — DX: Other specified abnormal findings of blood chemistry: R79.89

## 2013-11-04 MED ORDER — OMEPRAZOLE 20 MG PO CPDR: 20.0000 mg | DELAYED_RELEASE_CAPSULE | Freq: Every day | ORAL | Status: DC

## 2013-11-04 NOTE — Telephone Encounter (Signed)
Called pt and mail box full.

## 2013-11-04 NOTE — Progress Notes (Signed)
Pre visit review using our clinic review tool, if applicable. No additional management support is needed unless otherwise documented below in the visit note. 

## 2013-11-04 NOTE — Assessment & Plan Note (Signed)
stable overall by history and exam, recent data reviewed with pt, and pt to continue medical treatment as before,  to f/u any worsening symptoms or concerns Lab Results  Component Value Date   [place] 81 05/08/2013   For f/u labs

## 2013-11-04 NOTE — Patient Instructions (Addendum)
Your EKG was OK today  Please take all new medication as prescribed - the prilosec 20 mg per day  Please continue all other medications as before, and refills have been done if requested.  Please have the pharmacy call with any other refills you may need.  Please continue your efforts at being more active, low cholesterol diet, and weight control.  You are otherwise up to date with prevention measures today.  Please keep your appointments with your specialists as you may have planned  Please go to the LAB in the Basement (turn left off the elevator) for the tests to be done later this week in the AM  You will be contacted by phone if any changes need to be made immediately.  Otherwise, you will receive a letter about your results with an explanation, but please check with MyChart first.  Please remember to sign up for MyChart if you have not done so, as this will be important to you in the future with finding out test results, communicating by private email, and scheduling acute appointments online when needed.  Please return in 6 months, or sooner if needed, with Lab testing done 3-5 days before

## 2013-11-04 NOTE — Assessment & Plan Note (Signed)
stable overall by history and exam, recent data reviewed with pt, and pt to continue medical treatment as before,  to f/u any worsening symptoms or concerns Lab Results  Component Value Date   HGBA1C 7.0* 05/08/2013   For f/u labs

## 2013-11-04 NOTE — Assessment & Plan Note (Signed)
?   Signficacne, to repeat AM level with free testosterone, and LH, ? Clomid vs other testost replacement needed?

## 2013-11-04 NOTE — Assessment & Plan Note (Signed)
Suspect GI related, prob reflux with ? Esophagitis/gastritis, for h pylori, trial omeprazole 20 qd

## 2013-11-04 NOTE — Progress Notes (Signed)
   Subjective:    Patient ID: William Sheppard, male    DOB: [birth year], 43 y.o.   MRN: [id number]  HPI  Here with recent onset 2-3 dull SSCP, mild, intermittent, pressure like - like maybe needs to belch and relieves but does not actually help to belch, assoc with intermittent fatigue during the day, such that he even wants to go to sleep, but a few min later back to ok energy. No radiation of the discomfort, may have some sob but thinks maybe related to "constricted sinus." No diaphoresis, n/v, but has some  Off and on dizziness and occas palp's.  No fever, ST, cough though felt warm a few days ago, but no temp on testing at home. Has not tried any antacid or other otc.  Denies worsening abd pain, dysphagia, n/v, bowel change or blood.  Did notice change in manufacturer and different pills for his rx - pravastatin, lisinopril and metformin. Wt Readings from Last 3 Encounters:  11/04/13 222 lb (100.699 kg)  05/08/13 223 lb 6 oz (101.322 kg)  01/21/13 219 lb (99.338 kg)  Gained 3 lbs as above.  CT cardiac neg ddc 2014    Review of Systems     Objective:   Physical Exam        Assessment & Plan:

## 2013-11-04 NOTE — Telephone Encounter (Signed)
New message  Pt called feels tingling in legs and he also has intergestion. Pt has not consulted PCP however, he would like a call back to discuss with nurse.

## 2013-11-04 NOTE — Telephone Encounter (Signed)
Pt calling with c/o feeling fullness in his belly and extreme indigestion.  Pt c/o of cp, sob, no LEE, no palpitations, no syncopal episodes or any other cardiac issues at this time.  Pt states he does not get DOE.  Pts last BM was yesterday and noted dark stools, but was soft BM.  Pt has no urinary complaints at this time. Pt denies any neurological complaints at this time like HA, blurred vision, visual disturbances, or any stroke like symptoms.  Pt was wanting to ask Dr Meda Coffee if his complaints of distended abdomen and indigestion would be cardiac related.  Pt states he compliant with all his meds prescribed.  Advised pt that given his complaints he should schedule an appt with his PCP today and follow-up with our office based on his assessment.  Informed pt that I will also notify Dr Meda Coffee of his complaints by routing this message.  Pt verbalized understanding and agrees with this plan, stating I will call my PCP now and schedule an appt.

## 2013-11-05 ENCOUNTER — Other Ambulatory Visit (INDEPENDENT_AMBULATORY_CARE_PROVIDER_SITE_OTHER): Payer: BC Managed Care – PPO

## 2013-11-05 ENCOUNTER — Encounter: Payer: Self-pay | Admitting: Internal Medicine

## 2013-11-05 DIAGNOSIS — E1165 Type 2 diabetes mellitus with hyperglycemia: Secondary | ICD-10-CM

## 2013-11-05 DIAGNOSIS — R7989 Other specified abnormal findings of blood chemistry: Secondary | ICD-10-CM

## 2013-11-05 DIAGNOSIS — E291 Testicular hypofunction: Secondary | ICD-10-CM

## 2013-11-05 DIAGNOSIS — R079 Chest pain, unspecified: Secondary | ICD-10-CM

## 2013-11-05 DIAGNOSIS — IMO0001 Reserved for inherently not codable concepts without codable children: Secondary | ICD-10-CM

## 2013-11-05 HISTORY — DX: Testicular hypofunction: E29.1

## 2013-11-05 LAB — BASIC METABOLIC PANEL
BUN: 16 mg/dL (ref 6–23)
CALCIUM: 9.7 mg/dL (ref 8.4–10.5)
CO2: 29 mEq/L (ref 19–32)
Chloride: 104 mEq/L (ref 96–112)
Creatinine, Ser: 1.2 mg/dL (ref 0.4–1.5)
GFR: 86.25 mL/min (ref >60.00)
Glucose, Bld: 98 mg/dL (ref 70–99)
Potassium: 4 mEq/L (ref 3.5–5.1)
Sodium: 139 mEq/L (ref 135–145)

## 2013-11-05 LAB — HEPATIC FUNCTION PANEL
ALT: 48 U/L (ref 0–53)
AST: 24 U/L (ref 0–37)
Albumin: 4.6 g/dL (ref 3.5–5.2)
Alkaline Phosphatase: 47 U/L (ref 39–117)
BILIRUBIN DIRECT: 0.1 mg/dL (ref 0.0–0.3)
BILIRUBIN TOTAL: 0.7 mg/dL (ref 0.2–1.2)
TOTAL PROTEIN: 8.4 g/dL — AB (ref 6.0–8.3)

## 2013-11-05 LAB — HEMOGLOBIN A1C: Hgb A1c MFr Bld: 6.9 % — ABNORMAL HIGH (ref 4.6–6.5)

## 2013-11-05 LAB — LIPID PANEL
CHOL/HDL RATIO: 3
Cholesterol: 128 mg/dL (ref 0–200)
HDL: 43 mg/dL (ref >39.00)
LDL Cholesterol: 72 mg/dL (ref 0–99)
NONHDL: 85
TRIGLYCERIDES: 66 mg/dL (ref 0.0–149.0)
VLDL: 13.2 mg/dL (ref 0.0–40.0)

## 2013-11-05 LAB — LUTEINIZING HORMONE: LH: 5.79 m[IU]/mL (ref 1.50–9.30)

## 2013-11-05 LAB — H. PYLORI ANTIBODY, IGG: H Pylori IgG: NEGATIVE

## 2013-11-06 LAB — TESTOSTERONE, FREE, TOTAL, SHBG
SEX HORMONE BINDING: 18 nmol/L (ref 13–71)
Testosterone, Free: 104.4 pg/mL (ref 47.0–244.0)
Testosterone-% Free: 2.7 % (ref 1.6–2.9)
Testosterone: 383 ng/dL (ref 300–890)

## 2013-11-08 ENCOUNTER — Other Ambulatory Visit: Payer: Self-pay | Admitting: Internal Medicine

## 2013-11-08 DIAGNOSIS — Z0000 Encounter for general adult medical examination without abnormal findings: Secondary | ICD-10-CM

## 2013-11-08 DIAGNOSIS — IMO0001 Reserved for inherently not codable concepts without codable children: Secondary | ICD-10-CM

## 2013-11-08 DIAGNOSIS — E1165 Type 2 diabetes mellitus with hyperglycemia: Principal | ICD-10-CM

## 2013-11-10 ENCOUNTER — Telehealth: Payer: Self-pay | Admitting: Internal Medicine

## 2013-11-10 NOTE — Telephone Encounter (Signed)
Pt called in asked if a nurse could call him back about lab results.  He go notfication they were in his my chart but wants to talk with someone about them.  Best number [phone]   Thank you!!

## 2013-11-10 NOTE — Telephone Encounter (Signed)
Called the patient informed him of his results per PCP instructions.

## 2013-11-13 ENCOUNTER — Ambulatory Visit: Payer: BC Managed Care – PPO | Admitting: Internal Medicine

## 2013-11-27 ENCOUNTER — Other Ambulatory Visit: Payer: Self-pay

## 2014-05-05 ENCOUNTER — Ambulatory Visit: Payer: BLUE CROSS/BLUE SHIELD | Admitting: Internal Medicine

## 2014-07-15 ENCOUNTER — Other Ambulatory Visit: Payer: Self-pay | Admitting: Internal Medicine

## 2014-07-22 ENCOUNTER — Encounter: Payer: Self-pay | Admitting: Internal Medicine

## 2014-07-22 ENCOUNTER — Ambulatory Visit (INDEPENDENT_AMBULATORY_CARE_PROVIDER_SITE_OTHER): Payer: BLUE CROSS/BLUE SHIELD | Admitting: Internal Medicine

## 2014-07-22 ENCOUNTER — Other Ambulatory Visit (INDEPENDENT_AMBULATORY_CARE_PROVIDER_SITE_OTHER): Payer: BLUE CROSS/BLUE SHIELD

## 2014-07-22 VITALS — BP 116/78 | HR 104 | Temp 98.4°F | Wt 228.0 lb

## 2014-07-22 DIAGNOSIS — E119 Type 2 diabetes mellitus without complications: Secondary | ICD-10-CM

## 2014-07-22 DIAGNOSIS — R200 Anesthesia of skin: Secondary | ICD-10-CM

## 2014-07-22 DIAGNOSIS — R7989 Other specified abnormal findings of blood chemistry: Secondary | ICD-10-CM | POA: Diagnosis not present

## 2014-07-22 DIAGNOSIS — R0789 Other chest pain: Secondary | ICD-10-CM

## 2014-07-22 DIAGNOSIS — Z0000 Encounter for general adult medical examination without abnormal findings: Secondary | ICD-10-CM | POA: Diagnosis not present

## 2014-07-22 LAB — URINALYSIS, ROUTINE W REFLEX MICROSCOPIC
Bilirubin Urine: NEGATIVE
Leukocytes, UA: NEGATIVE
NITRITE: NEGATIVE
Specific Gravity, Urine: >=1.03 — AB (ref 1.000–1.030)
Total Protein, Urine: 30 — AB
Urine Glucose: NEGATIVE
Urobilinogen, UA: 1 (ref 0.0–1.0)
pH: 6 (ref 5.0–8.0)

## 2014-07-22 LAB — HEPATIC FUNCTION PANEL
ALT: 41 U/L (ref 0–53)
AST: 17 U/L (ref 0–37)
Albumin: 4.7 g/dL (ref 3.5–5.2)
Alkaline Phosphatase: 53 U/L (ref 39–117)
BILIRUBIN TOTAL: 0.4 mg/dL (ref 0.2–1.2)
Bilirubin, Direct: 0.1 mg/dL (ref 0.0–0.3)
TOTAL PROTEIN: 7.8 g/dL (ref 6.0–8.3)

## 2014-07-22 LAB — LDL CHOLESTEROL, DIRECT: LDL DIRECT: 83 mg/dL

## 2014-07-22 LAB — CBC WITH DIFFERENTIAL/PLATELET
BASOS ABS: 0 10*3/uL (ref 0.0–0.1)
Basophils Relative: 0.5 % (ref 0.0–3.0)
EOS PCT: 1 % (ref 0.0–5.0)
Eosinophils Absolute: 0 10*3/uL (ref 0.0–0.7)
HCT: 43 % (ref 39.0–52.0)
HEMOGLOBIN: 13.7 g/dL (ref 13.0–17.0)
LYMPHS PCT: 42.4 % (ref 12.0–46.0)
Lymphs Abs: 1.9 10*3/uL (ref 0.7–4.0)
MCHC: 31.9 g/dL (ref 30.0–36.0)
MONOS PCT: 8.3 % (ref 3.0–12.0)
Monocytes Absolute: 0.4 10*3/uL (ref 0.1–1.0)
Neutro Abs: 2.1 10*3/uL (ref 1.4–7.7)
Neutrophils Relative %: 47.8 % (ref 43.0–77.0)
Platelets: 224 10*3/uL (ref 150.0–400.0)
RBC: 6.22 Mil/uL — ABNORMAL HIGH (ref 4.22–5.81)
RDW: 15.1 % (ref 11.5–15.5)
WBC: 4.5 10*3/uL (ref 4.0–10.5)

## 2014-07-22 LAB — PSA: PSA: 0.34 ng/mL (ref 0.10–4.00)

## 2014-07-22 LAB — BASIC METABOLIC PANEL
BUN: 17 mg/dL (ref 6–23)
CO2: 31 mEq/L (ref 19–32)
Calcium: 9.7 mg/dL (ref 8.4–10.5)
Chloride: 103 mEq/L (ref 96–112)
Creatinine, Ser: 1.2 mg/dL (ref 0.40–1.50)
GFR: 85.13 mL/min (ref >60.00)
GLUCOSE: 132 mg/dL — AB (ref 70–99)
Potassium: 4 mEq/L (ref 3.5–5.1)
Sodium: 138 mEq/L (ref 135–145)

## 2014-07-22 LAB — LIPID PANEL
CHOLESTEROL: 152 mg/dL (ref 0–200)
HDL: 43.7 mg/dL (ref >39.00)
NonHDL: 108.3
Total CHOL/HDL Ratio: 3
Triglycerides: 283 mg/dL — ABNORMAL HIGH (ref 0.0–149.0)
VLDL: 56.6 mg/dL — ABNORMAL HIGH (ref 0.0–40.0)

## 2014-07-22 LAB — VITAMIN B12: Vitamin B-12: 449 pg/mL (ref 211–911)

## 2014-07-22 LAB — MICROALBUMIN / CREATININE URINE RATIO
Creatinine,U: 276.9 mg/dL
MICROALB/CREAT RATIO: 4.6 mg/g (ref 0.0–30.0)
Microalb, Ur: 12.8 mg/dL — ABNORMAL HIGH (ref 0.0–1.9)

## 2014-07-22 LAB — TSH: TSH: 0.82 u[IU]/mL (ref 0.35–4.50)

## 2014-07-22 LAB — HEMOGLOBIN A1C: Hgb A1c MFr Bld: 7.1 % — ABNORMAL HIGH (ref 4.6–6.5)

## 2014-07-22 NOTE — Assessment & Plan Note (Addendum)
ECG reviewed as per emr, atypical, most c/w msk, cont current tx,  to f/u any worsening symptoms or concerns

## 2014-07-22 NOTE — Progress Notes (Signed)
Subjective:    Patient ID: William Sheppard, male    DOB: 11/14/1970, 44 y.o.   MRN: [id number]  HPI  Here for wellness and f/u;  Overall doing ok;  Pt denies Chest pain, worsening SOB, DOE, wheezing, orthopnea, PND, worsening LE edema, palpitations, dizziness or syncope.  Pt denies neurological change such as new headache, facial or extremity weakness.  Pt denies polydipsia, polyuria, or low sugar symptoms. Pt states overall good compliance with treatment and medications, good tolerability, and has been trying to follow appropriate diet.  Pt denies worsening depressive symptoms, suicidal ideation or panic. No fever, night sweats, wt loss, loss of appetite, or other constitutional symptoms.  Pt states good ability with ADL's, has low fall risk, home safety reviewed and adequate, no other significant changes in hearing or vision, and only occasionally active with exercise.  Does c/o paresthes to left fingertips and toes for several months. Also saeveral months intermittent left CP, dull but worse with left shoulder and arm movement, o/w non pleuritic, non positional , nonexertional. Not assoc with other such as diaphoresis and above. Past Medical History  Diagnosis Date  . Diabetes mellitus   . Chronic headaches   . Hyperlipidemia   . Non Hodgkin's lymphoma    Past Surgical History  Procedure Laterality Date  . Portacath placement  1999  . Wisdom tooth extraction      reports that he has never smoked. He does not have any smokeless tobacco history on file. He reports that he drinks alcohol. He reports that he does not use illicit drugs. family history includes Diabetes in his other. No Active Allergies Current Outpatient Prescriptions on File Prior to Visit  Medication Sig Dispense Refill  . aspirin 81 MG EC tablet Take 1 tablet (81 mg total) by mouth daily. Swallow whole. 30 tablet 12  . glucose blood test strip 1 each by Other route daily. And lancets 1/day 250.00    . lisinopril  (PRINIVIL,ZESTRIL) 5 MG tablet TAKE 1 TABLET EACH DAY. 30 tablet 3  . metFORMIN (GLUCOPHAGE) 500 MG tablet TAKE 1 TABLET EACH DAY. 30 tablet 3  . omeprazole (PRILOSEC) 20 MG capsule Take 1 capsule (20 mg total) by mouth daily. 90 capsule 3  . pravastatin (PRAVACHOL) 20 MG tablet TAKE 1 TABLET EACH DAY. 30 tablet 3  . metFORMIN (GLUCOPHAGE-XR) 500 MG 24 hr tablet Take 500 mg by mouth daily with breakfast.     No current facility-administered medications on file prior to visit.   Review of Systems Constitutional: Negative for increased diaphoresis, other activity, appetite or siginficant weight change other than noted HENT: Negative for worsening hearing loss, ear pain, facial swelling, mouth sores and neck stiffness.   Eyes: Negative for other worsening pain, redness or visual disturbance.  Respiratory: Negative for shortness of breath and wheezing  Cardiovascular: Negative for chest pain and palpitations.  Gastrointestinal: Negative for diarrhea, blood in stool, abdominal distention or other pain Genitourinary: Negative for hematuria, flank pain or change in urine volume.  Musculoskeletal: Negative for myalgias or other joint complaints.  Skin: Negative for color change and wound or drainage.  Neurological: Negative for syncope and numbness. other than noted Hematological: Negative for adenopathy. or other swelling Psychiatric/Behavioral: Negative for hallucinations, SI, self-injury, decreased concentration or other worsening agitation.      Objective:   Physical Exam BP 116/78 mmHg  Pulse 104  Temp(Src) 98.4 F (36.9 C) (Oral)  Wt 228 lb (103.42 kg)  SpO2 93% VS noted,  Constitutional:  Pt is oriented to person, place, and time. Appears well-developed and well-nourished, in no significant distress Head: Normocephalic and atraumatic.  Right Ear: External ear normal.  Left Ear: External ear normal.  Nose: Nose normal.  Mouth/Throat: Oropharynx is clear and moist.  Eyes:  Conjunctivae and EOM are normal. Pupils are equal, round, and reactive to light.  Neck: Normal range of motion. Neck supple. No JVD present. No tracheal deviation present or significant neck LA or mass Cardiovascular: Normal rate, regular rhythm, normal heart sounds and intact distal pulses.   Pulmonary/Chest: Effort normal and breath sounds without rales or wheezing  Abdominal: Soft. Bowel sounds are normal. NT. No HSM  Musculoskeletal: Normal range of motion. Exhibits no edema.  Lymphadenopathy:  Has no cervical adenopathy.  Neurological: Pt is alert and oriented to person, place, and time. Pt has normal reflexes. No cranial nerve deficit. Motor grossly intact Skin: Skin is warm and dry. No rash noted.  Psychiatric:  Has normal mood and affect. Behavior is normal.  Wt Readings from Last 3 Encounters:  07/22/14 208 lb (94.348 kg)  11/04/13 222 lb (100.699 kg)  05/08/13 223 lb 6 oz (101.322 kg)       Assessment & Plan:

## 2014-07-22 NOTE — Patient Instructions (Signed)
Please continue all other medications as before, and refills have been done if requested.  Please have the pharmacy call with any other refills you may need.  Please continue your efforts at being more active, low cholesterol diet, and weight control.  You are otherwise up to date with prevention measures today.  Please keep your appointments with your specialists as you may have planned  Your EKG was OK today  Please go to the LAB in the Basement (turn left off the elevator) for the tests to be done today  You will be contacted by phone if any changes need to be made immediately.  Otherwise, you will receive a letter about your results with an explanation, but please check with MyChart first.  Please remember to sign up for MyChart if you have not done so, as this will be important to you in the future with finding out test results, communicating by private email, and scheduling acute appointments online when needed.  Please return in 6 months, or sooner if needed, with Lab testing done 3-5 days before

## 2014-07-22 NOTE — Progress Notes (Signed)
Pre visit review using our clinic review tool, if applicable. No additional management support is needed unless otherwise documented below in the visit note. 

## 2014-07-23 NOTE — Assessment & Plan Note (Signed)
stable overall by history and exam, recent data reviewed with pt, and pt to continue medical treatment as before,  to f/u any worsening symptoms or concerns Lab Results  Component Value Date   HGBA1C 7.1* 07/22/2014

## 2014-07-23 NOTE — Assessment & Plan Note (Signed)

## 2014-07-23 NOTE — Assessment & Plan Note (Signed)
Also for b12, f/o deficiency

## 2014-12-16 ENCOUNTER — Other Ambulatory Visit: Payer: Self-pay | Admitting: Internal Medicine

## 2015-01-19 ENCOUNTER — Ambulatory Visit (INDEPENDENT_AMBULATORY_CARE_PROVIDER_SITE_OTHER): Payer: BLUE CROSS/BLUE SHIELD | Admitting: Internal Medicine

## 2015-01-19 ENCOUNTER — Encounter: Payer: Self-pay | Admitting: Internal Medicine

## 2015-01-19 VITALS — BP 106/80 | HR 112 | Temp 98.3°F | Ht 74.0 in | Wt 226.0 lb

## 2015-01-19 DIAGNOSIS — J019 Acute sinusitis, unspecified: Secondary | ICD-10-CM | POA: Insufficient documentation

## 2015-01-19 DIAGNOSIS — E119 Type 2 diabetes mellitus without complications: Secondary | ICD-10-CM | POA: Diagnosis not present

## 2015-01-19 MED ORDER — HYDROCODONE-HOMATROPINE 5-1.5 MG/5ML PO SYRP: 5.0000 mL | ORAL_SOLUTION | Freq: Four times a day (QID) | ORAL | Status: DC | PRN

## 2015-01-19 MED ORDER — LEVOFLOXACIN 250 MG PO TABS: 250.0000 mg | ORAL_TABLET | Freq: Every day | ORAL | Status: DC

## 2015-01-19 NOTE — Patient Instructions (Signed)
Please take all new medication as prescribed - the antibiotic, and cough medicine ° °Please continue all other medications as before, and refills have been done if requested. ° °Please have the pharmacy call with any other refills you may need. ° °Please continue your efforts at being more active, low cholesterol diet, and weight control. ° °Please keep your appointments with your specialists as you may have planned ° ° ° °

## 2015-01-19 NOTE — Progress Notes (Signed)
Pre visit review using our clinic review tool, if applicable. No additional management support is needed unless otherwise documented below in the visit note. 

## 2015-01-21 LAB — HM DIABETES EYE EXAM

## 2015-01-23 NOTE — Assessment & Plan Note (Signed)
stable overall by history and exam, recent data reviewed with pt, and pt to continue medical treatment as before,  to f/u any worsening symptoms or concerns Lab Results  Component Value Date   HGBA1C 7.1* 07/22/2014    

## 2015-01-23 NOTE — Progress Notes (Signed)
   Subjective:    Patient ID: William Sheppard, male    DOB: 1970-08-20, 44 y.o.   MRN: [id number]  HPI   Here with 2-3 days acute onset fever, facial pain, pressure, headache, general weakness and malaise, and greenish d/c, with mild ST and cough, but pt denies chest pain, wheezing, increased sob or doe, orthopnea, PND, increased LE swelling, palpitations, dizziness or syncope.  Pt denies polydipsia, polyuria, or low sugar symptoms such as weakness or confusion improved with po intake.  Pt states overall good compliance with meds, trying to follow lower cholesterol, diabetic diet, wt overall stable. Wt Readings from Last 3 Encounters:  01/19/15 226 lb (102.513 kg)  07/22/14 228 lb (103.42 kg)  11/04/13 222 lb (100.699 kg)   Past Medical History  Diagnosis Date  . Diabetes mellitus   . Chronic headaches   . Hyperlipidemia   . Non Hodgkin's lymphoma [facility])    Past Surgical History  Procedure Laterality Date  . Portacath placement  1999  . Wisdom tooth extraction      reports that he has never smoked. He does not have any smokeless tobacco history on file. He reports that he drinks alcohol. He reports that he does not use illicit drugs. family history includes Diabetes in his other. No Active Allergies Current Outpatient Prescriptions on File Prior to Visit  Medication Sig Dispense Refill  . aspirin 81 MG EC tablet Take 1 tablet (81 mg total) by mouth daily. Swallow whole. 30 tablet 12  . glucose blood test strip 1 each by Other route daily. And lancets 1/day 250.00    . LANCETS ULTRA THIN MISC   0  . lisinopril (PRINIVIL,ZESTRIL) 5 MG tablet TAKE 1 TABLET EACH DAY. 30 tablet 11  . metFORMIN (GLUCOPHAGE) 500 MG tablet TAKE 1 TABLET EACH DAY. 30 tablet 11  . omeprazole (PRILOSEC) 20 MG capsule Take 1 capsule (20 mg total) by mouth daily. 90 capsule 3  . pravastatin (PRAVACHOL) 20 MG tablet TAKE 1 TABLET EACH DAY. 30 tablet 11   No current facility-administered medications on file prior to  visit.   Review of Systems  All otherwise neg per pt      Objective:   Physical Exam BP 106/80 mmHg  Pulse 112  Temp(Src) 98.3 F (36.8 C) (Oral)  Ht 6\' 2"  (1.88 m)  Wt 226 lb (102.513 kg)  BMI 29.00 kg/m2  SpO2 96% VS noted, mild ill Constitutional: Pt appears in no significant distress HENT: Head: NCAT.  Right Ear: External ear normal.  Left Ear: External ear normal.  Bilat tm's with mild erythema.  Max sinus areas mild tender.  Pharynx with mild erythema, no exudate Eyes: . Pupils are equal, round, and reactive to light. Conjunctivae and EOM are normal Neck: Normal range of motion. Neck supple.  Cardiovascular: Normal rate and regular rhythm.   Pulmonary/Chest: Effort normal and breath sounds without rales or wheezing.  Neurological: Pt is alert. Not confused , motor grossly intact Skin: Skin is warm. No rash, no LE edema Psychiatric: Pt behavior is normal. No agitation.        Assessment & Plan:

## 2015-01-27 ENCOUNTER — Telehealth: Payer: Self-pay | Admitting: Internal Medicine

## 2015-01-27 MED ORDER — DOXYCYCLINE HYCLATE 100 MG PO TABS: 100.0000 mg | ORAL_TABLET | Freq: Two times a day (BID) | ORAL | Status: DC

## 2015-01-27 NOTE — Telephone Encounter (Signed)
Pt called in said that he came in last week and got an antibiotic .  He stated that he as lost weight, everything goes right though him.  He wants to know what he should do?

## 2015-01-27 NOTE — Telephone Encounter (Signed)
Please advise, pt has been taking Levaquin x 8 days and has been experiencing diarrhea. No significant weight loss. Pt is requesting to D/C ABX or Rx an alternative. Please advise in PCP's absence, thanks

## 2015-01-27 NOTE — Telephone Encounter (Signed)
Pt confirmed NKDA. Rx sent in, pt advised of same

## 2015-01-27 NOTE — Telephone Encounter (Signed)
It is okay to stop the medication. Please confirm his allergies as it is currently unknown. If he does not have any we can start doxycycline 100 mg PO BID #20.

## 2015-01-29 ENCOUNTER — Encounter (HOSPITAL_COMMUNITY): Payer: Self-pay | Admitting: Emergency Medicine

## 2015-01-29 ENCOUNTER — Ambulatory Visit: Payer: BLUE CROSS/BLUE SHIELD | Admitting: Family Medicine

## 2015-01-29 ENCOUNTER — Emergency Department (HOSPITAL_COMMUNITY)
Admission: EM | Admit: 2015-01-29 | Discharge: 2015-01-29 | Disposition: A | Discharge status: Home / Self Care | Payer: BLUE CROSS/BLUE SHIELD | Attending: Emergency Medicine | Admitting: Emergency Medicine

## 2015-01-29 DIAGNOSIS — E1165 Type 2 diabetes mellitus with hyperglycemia: Secondary | ICD-10-CM | POA: Diagnosis not present

## 2015-01-29 DIAGNOSIS — G8929 Other chronic pain: Secondary | ICD-10-CM | POA: Insufficient documentation

## 2015-01-29 DIAGNOSIS — R739 Hyperglycemia, unspecified: Secondary | ICD-10-CM

## 2015-01-29 DIAGNOSIS — Z79899 Other long term (current) drug therapy: Secondary | ICD-10-CM | POA: Diagnosis not present

## 2015-01-29 DIAGNOSIS — Z7982 Long term (current) use of aspirin: Secondary | ICD-10-CM | POA: Diagnosis not present

## 2015-01-29 DIAGNOSIS — E785 Hyperlipidemia, unspecified: Secondary | ICD-10-CM | POA: Insufficient documentation

## 2015-01-29 DIAGNOSIS — Z792 Long term (current) use of antibiotics: Secondary | ICD-10-CM | POA: Insufficient documentation

## 2015-01-29 DIAGNOSIS — Z7984 Long term (current) use of oral hypoglycemic drugs: Secondary | ICD-10-CM | POA: Insufficient documentation

## 2015-01-29 DIAGNOSIS — Z8572 Personal history of non-Hodgkin lymphomas: Secondary | ICD-10-CM | POA: Diagnosis not present

## 2015-01-29 LAB — BLOOD GAS, VENOUS
Acid-base deficit: 1.4 mmol/L (ref 0.0–2.0)
BICARBONATE: 23.5 meq/L (ref 20.0–24.0)
FIO2: 0.21
O2 SAT: 87.3 %
PATIENT TEMPERATURE: 98.2
PH VEN: 7.361 — AB (ref 7.250–7.300)
TCO2: 21.4 mmol/L (ref 0–100)
pCO2, Ven: 42.4 mmHg — ABNORMAL LOW (ref 45.0–50.0)
pO2, Ven: 57.2 mmHg — ABNORMAL HIGH (ref 30.0–45.0)

## 2015-01-29 LAB — COMPREHENSIVE METABOLIC PANEL
ALBUMIN: 4.6 g/dL (ref 3.5–5.0)
ALK PHOS: 69 U/L (ref 38–126)
ALT: 49 U/L (ref 17–63)
ANION GAP: 10 (ref 5–15)
AST: 24 U/L (ref 15–41)
BUN: 27 mg/dL — AB (ref 6–20)
CHLORIDE: 98 mmol/L — AB (ref 101–111)
CO2: 24 mmol/L (ref 22–32)
Calcium: 9.9 mg/dL (ref 8.9–10.3)
Creatinine, Ser: 1.19 mg/dL (ref 0.61–1.24)
GFR calc non Af Amer: >60 mL/min (ref >60)
Glucose, Bld: 498 mg/dL — ABNORMAL HIGH (ref 65–99)
POTASSIUM: 4.3 mmol/L (ref 3.5–5.1)
SODIUM: 132 mmol/L — AB (ref 135–145)
TOTAL PROTEIN: 8.4 g/dL — AB (ref 6.5–8.1)
Total Bilirubin: 0.6 mg/dL (ref 0.3–1.2)

## 2015-01-29 LAB — CBC WITH DIFFERENTIAL/PLATELET
BASOS ABS: 0 10*3/uL (ref 0.0–0.1)
Basophils Relative: 0 %
EOS ABS: 0 10*3/uL (ref 0.0–0.7)
Eosinophils Relative: 1 %
HCT: 41.4 % (ref 39.0–52.0)
HEMOGLOBIN: 13 g/dL (ref 13.0–17.0)
LYMPHS PCT: 51 %
Lymphs Abs: 2.1 10*3/uL (ref 0.7–4.0)
MCH: 21.8 pg — AB (ref 26.0–34.0)
MCHC: 31.4 g/dL (ref 30.0–36.0)
MCV: 69.5 fL — ABNORMAL LOW (ref 78.0–100.0)
MONO ABS: 0.3 10*3/uL (ref 0.1–1.0)
Monocytes Relative: 6 %
NEUTROS PCT: 42 %
Neutro Abs: 1.8 10*3/uL (ref 1.7–7.7)
PLATELETS: 224 10*3/uL (ref 150–400)
RBC: 5.96 MIL/uL — AB (ref 4.22–5.81)
RDW: 14.2 % (ref 11.5–15.5)
WBC: 4.2 10*3/uL (ref 4.0–10.5)

## 2015-01-29 LAB — CBG MONITORING, ED
GLUCOSE-CAPILLARY: 303 mg/dL — AB (ref 65–99)
GLUCOSE-CAPILLARY: 481 mg/dL — AB (ref 65–99)
Glucose-Capillary: 411 mg/dL — ABNORMAL HIGH (ref 65–99)

## 2015-01-29 LAB — I-STAT CG4 LACTIC ACID, ED: LACTIC ACID, VENOUS: 1.23 mmol/L (ref 0.5–2.0)

## 2015-01-29 MED ORDER — SODIUM CHLORIDE 0.9 % IV BOLUS (SEPSIS)
1000.0000 mL | Freq: Once | INTRAVENOUS | Status: AC
  Administered 2015-01-29: 1000 mL via INTRAVENOUS

## 2015-01-29 MED ORDER — INSULIN ASPART 100 UNIT/ML ~~LOC~~ SOLN
8.0000 [IU] | Freq: Once | SUBCUTANEOUS | Status: AC
  Administered 2015-01-29: 8 [IU] via INTRAVENOUS
  Filled 2015-01-29: qty 1

## 2015-01-29 NOTE — ED Notes (Signed)
respiratory called for [facility]

## 2015-01-29 NOTE — ED Notes (Signed)
Pt states his blood sugar was 581 at home  Pt denies any symptoms at this time

## 2015-01-29 NOTE — ED Notes (Signed)
MD notified of CBG 

## 2015-01-29 NOTE — ED Provider Notes (Signed)
CSN: [id number]     Arrival date & time 01/29/15  0010 History  By signing my name below, I, Terrance Branch, attest that this documentation has been prepared under the direction and in the presence of Varney Biles, MD. Electronically Signed: Randa Evens, ED Scribe. 01/29/2015. 3:16 AM.   Chief Complaint  Patient presents with  . Hyperglycemia    The history is provided by the patient. No language interpreter was used.   HPI Comments: William Sheppard is a 44 y.o. male who presents to the Emergency Department complaining of high blood sugar that began today. Pt states that he has been complaint with his metformin. Pt does report taking antibiotics for a recent sinus infection. He states that his sugars are usually well controlled. Pt denies any other new medications. Pt states that his last does of met formin was a few hours PTA.  Denies fever, chills HA, neck pain, abdominal pain, n/v/d.   Past Medical History  Diagnosis Date  . Diabetes mellitus   . Chronic headaches   . Hyperlipidemia   . Non Hodgkin's lymphoma [facility])    Past Surgical History  Procedure Laterality Date  . Portacath placement  1999  . Wisdom tooth extraction     History reviewed. No pertinent family history. Social History  Substance Use Topics  . Smoking status: Never Smoker   . Smokeless tobacco: None  . Alcohol Use: Yes     Comment: social    Review of Systems  Constitutional: Negative for fever and chills.  Gastrointestinal: Negative for nausea, vomiting, abdominal pain and diarrhea.  Musculoskeletal: Negative for neck pain.  Neurological: Negative for headaches.  All other systems reviewed and are negative.     Allergies  Review of patient's allergies indicates no known allergies.  Home Medications   Prior to Admission medications   Medication Sig Start Date End Date Taking? Authorizing Provider  aspirin 81 MG EC tablet Take 1 tablet (81 mg total) by mouth daily. Swallow whole. 05/07/12   Yes Biagio Borg, MD  doxycycline (VIBRA-TABS) 100 MG tablet Take 1 tablet (100 mg total) by mouth 2 (two) times daily. 01/27/15  Yes Golden Circle, FNP  levofloxacin (LEVAQUIN) 250 MG tablet Take 1 tablet (250 mg total) by mouth daily. 01/19/15  Yes Biagio Borg, MD  lisinopril (PRINIVIL,ZESTRIL) 5 MG tablet TAKE 1 TABLET EACH DAY. 12/16/14  Yes Biagio Borg, MD  metFORMIN (GLUCOPHAGE) 500 MG tablet TAKE 1 TABLET EACH DAY. 12/16/14  Yes Biagio Borg, MD  pravastatin (PRAVACHOL) 20 MG tablet TAKE 1 TABLET EACH DAY. 12/16/14  Yes Biagio Borg, MD  glucose blood test strip 1 each by Other route daily. And lancets 1/day 250.00 09/16/12   Biagio Borg, MD  HYDROcodone-homatropine [facility]) 5-1.5 MG/5ML syrup Take 5 mLs by mouth every 6 (six) hours as needed for cough. Patient not taking: Reported on 01/29/2015 01/19/15   Biagio Borg, MD  Gilman Aurora  07/07/14   Historical Provider, MD  omeprazole (PRILOSEC) 20 MG capsule Take 1 capsule (20 mg total) by mouth daily. Patient not taking: Reported on 01/29/2015 11/04/13   Biagio Borg, MD   BP 122/79 mmHg  Pulse 77  Temp(Src) 98.2 F (36.8 C) (Oral)  Resp 14  Ht _0  (1.88 m)  Wt 223 lb (101.152 kg)  BMI 28.62 kg/m2  SpO2 96%   Physical Exam  Constitutional: He is oriented to person, place, and time. He appears well-developed and  well-nourished. No distress.  HENT:  Head: Normocephalic and atraumatic.  Eyes: Conjunctivae and EOM are normal.  Neck: Neck supple. No tracheal deviation present.  No nuchal rigidity.   Cardiovascular: Normal rate and regular rhythm.   Pulmonary/Chest: Effort normal. No respiratory distress. He exhibits no tenderness.  Lung clear to auscultation.   Abdominal: Soft. There is no tenderness.  Musculoskeletal: Normal range of motion.  Lymphadenopathy:       Head (right side): No posterior auricular adenopathy present.       Head (left side): No posterior auricular adenopathy present.    He has no cervical  adenopathy.       Right cervical: No posterior cervical adenopathy present.      Left cervical: No posterior cervical adenopathy present.  No pre or post auricular or cervical adenopathy.   Neurological: He is alert and oriented to person, place, and time.  Skin: Skin is warm and dry.  Psychiatric: He has a normal mood and affect. His behavior is normal.  Nursing note and vitals reviewed.   ED Course  Procedures (including critical care time) DIAGNOSTIC STUDIES: Oxygen Saturation is 96% on RA, normal by my interpretation.    COORDINATION OF CARE: 3:16 AM-Discussed treatment plan with pt at bedside and pt agreed to plan.     Labs Review Labs Reviewed  CBC WITH DIFFERENTIAL/PLATELET - Abnormal; Notable for the following:    RBC 5.96 (*)    MCV 69.5 (*)    MCH 21.8 (*)    All other components within normal limits  COMPREHENSIVE METABOLIC PANEL - Abnormal; Notable for the following:    Sodium 132 (*)    Chloride 98 (*)    Glucose, Bld 498 (*)    BUN 27 (*)    Total Protein 8.4 (*)    All other components within normal limits  BLOOD GAS, VENOUS - Abnormal; Notable for the following:    pH, Ven 7.361 (*)    pCO2, Ven 42.4 (*)    pO2, Ven 57.2 (*)    All other components within normal limits  CBG MONITORING, ED - Abnormal; Notable for the following:    Glucose-Capillary 481 (*)    All other components within normal limits  CBG MONITORING, ED - Abnormal; Notable for the following:    Glucose-Capillary 411 (*)    All other components within normal limits  CBG MONITORING, ED - Abnormal; Notable for the following:    Glucose-Capillary 303 (*)    All other components within normal limits  I-STAT CG4 LACTIC ACID, ED    Imaging Review No results found.    EKG Interpretation None      MDM   Final diagnoses:  Acute hyperglycemia    I personally performed the services described in this documentation, which was scribed in my presence. The recorded information has been  reviewed and is accurate.  Pt has elevated blood glucose. He reports that he is compliant with his meds and doesn't indicate any red flags for infection, ACS. Basic labs will be checked. Anticipate d/c with close pcp f.u.     Varney Biles, MD 01/29/15 [id number]

## 2015-01-29 NOTE — ED Notes (Signed)
CBG 411 

## 2015-01-29 NOTE — Discharge Instructions (Signed)
Continue with your meds as prescribed. Try to check your blood glucose 2-3 times a day, one being first think in the morning and the others before meals - and see your doctor with the recorded numbers in 1 week if the blood sugar stays high.   Daily Diabetes Record Check your blood glucose (BG) as directed by your health care provider. Use this form to record your results as well as any diabetes medicines you take, including insulin. Checking your BG, recording it, and bringing your records to your health care provider is very helpful in managing your diabetes. These numbers help your health care provider know if any changes are needed to your diabetes plan.  Week of _____________________________ Date: _________  William Sheppard, BG/Medicines: ________________ / __________________________________________________________  LUNCH, BG/Medicines: ____________________ / __________________________________________________________  William Sheppard, BG/Medicines: ___________________ / __________________________________________________________  BEDTIME, BG/Medicines: __________________ / __________________________________________________________ Date: _________  William Sheppard, BG/Medicines: ________________ / __________________________________________________________  LUNCH, BG/Medicines: ____________________ / __________________________________________________________  William Sheppard, BG/Medicines: ___________________ / __________________________________________________________  BEDTIME, BG/Medicines: __________________ / __________________________________________________________ Date: _________  William Sheppard, BG/Medicines: ________________ / __________________________________________________________  LUNCH, BG/Medicines: ____________________ / __________________________________________________________  William Sheppard, BG/Medicines: ___________________ / __________________________________________________________  BEDTIME,  BG/Medicines: __________________ / __________________________________________________________ Date: _________  William Sheppard, BG/Medicines: ________________ / __________________________________________________________  LUNCH, BG/Medicines: ____________________ / __________________________________________________________  William Sheppard, BG/Medicines: ___________________ / __________________________________________________________  BEDTIME, BG/Medicines: __________________ / __________________________________________________________ Date: _________  William Sheppard, BG/Medicines: ________________ / __________________________________________________________  LUNCH, BG/Medicines: ____________________ / __________________________________________________________  William Sheppard, BG/Medicines: ___________________ / __________________________________________________________  BEDTIME, BG/Medicines: __________________ / __________________________________________________________ Date: _________  William Sheppard, BG/Medicines: ________________ / __________________________________________________________  LUNCH, BG/Medicines: ____________________ / __________________________________________________________  William Sheppard, BG/Medicines: ___________________ / __________________________________________________________  BEDTIME, BG/Medicines: __________________ / __________________________________________________________ Date: _________  William Sheppard, BG/Medicines: ________________ / __________________________________________________________  LUNCH, BG/Medicines: ____________________ / __________________________________________________________  William Sheppard, BG/Medicines: ___________________ / __________________________________________________________  BEDTIME, BG/Medicines: __________________ / __________________________________________________________ Notes:  __________________________________________________________________________________________________   This information is not intended to replace advice given to you by your health care provider. Make sure you discuss any questions you have with your health care provider.   Document Released: 01/03/2004 Document Revised: 02/19/2014 Document Reviewed: 03/25/2013 Elsevier Interactive Patient Education 2016 [place].  Hyperglycemia Hyperglycemia occurs when the glucose (sugar) in your blood is too high. Hyperglycemia can happen for many reasons, but it most often happens to people who do not know they have diabetes or are not managing their diabetes properly.  CAUSES  Whether you have diabetes or not, there are other causes of hyperglycemia. Hyperglycemia can occur when you have diabetes, but it can also occur in other situations that you might not be as aware of, such as: Diabetes  If you have diabetes and are having problems controlling your blood glucose, hyperglycemia could occur because of some of the following reasons:  Not following your meal plan.  Not taking your diabetes medications or not taking it properly.  Exercising less or doing less activity than you normally do.  Being sick. Pre-diabetes  This cannot be ignored. Before people develop Type 2 diabetes, they almost always have "pre-diabetes." This is when your blood glucose levels are higher than normal, but not yet high enough to be diagnosed as diabetes. Research has shown that some long-term damage to the body, especially the heart and circulatory system, may already be occurring during pre-diabetes. If you take action to manage your blood glucose when you have pre-diabetes, you may delay or prevent Type 2 diabetes from developing. Stress  If you have diabetes, you may be "diet" controlled or on oral medications or insulin to control your diabetes. However, you may  find that your blood glucose is higher than usual in the  hospital whether you have diabetes or not. This is often referred to as "stress hyperglycemia." Stress can elevate your blood glucose. This happens because of hormones put out by the body during times of stress. If stress has been the cause of your high blood glucose, it can be followed regularly by your caregiver. That way he/she can make sure your hyperglycemia does not continue to get worse or progress to diabetes. Steroids  Steroids are medications that act on the infection fighting system (immune system) to block inflammation or infection. One side effect can be a rise in blood glucose. Most people can produce enough extra insulin to allow for this rise, but for those who cannot, steroids make blood glucose levels go even higher. It is not unusual for steroid treatments to "uncover" diabetes that is developing. It is not always possible to determine if the hyperglycemia will go away after the steroids are stopped. A special blood test called an A1c is sometimes done to determine if your blood glucose was elevated before the steroids were started. SYMPTOMS  Thirsty.  Frequent urination.  Dry mouth.  Blurred vision.  Tired or fatigue.  Weakness.  Sleepy.  Tingling in feet or leg. DIAGNOSIS  Diagnosis is made by monitoring blood glucose in one or all of the following ways:  A1c test. This is a chemical found in your blood.  Fingerstick blood glucose monitoring.  Laboratory results. TREATMENT  First, knowing the cause of the hyperglycemia is important before the hyperglycemia can be treated. Treatment may include, but is not be limited to:  Education.  Change or adjustment in medications.  Change or adjustment in meal plan.  Treatment for an illness, infection, etc.  More frequent blood glucose monitoring.  Change in exercise plan.  Decreasing or stopping steroids.  Lifestyle changes. HOME CARE INSTRUCTIONS   Test your blood glucose as directed.  Exercise  regularly. Your caregiver will give you instructions about exercise. Pre-diabetes or diabetes which comes on with stress is helped by exercising.  Eat wholesome, balanced meals. Eat often and at regular, fixed times. Your caregiver or nutritionist will give you a meal plan to guide your sugar intake.  Being at an ideal weight is important. If needed, losing as little as 10 to 15 pounds may help improve blood glucose levels. SEEK MEDICAL CARE IF:   You have questions about medicine, activity, or diet.  You continue to have symptoms (problems such as increased thirst, urination, or weight gain). SEEK IMMEDIATE MEDICAL CARE IF:   You are vomiting or have diarrhea.  Your breath smells fruity.  You are breathing faster or slower.  You are very sleepy or incoherent.  You have numbness, tingling, or pain in your feet or hands.  You have chest pain.  Your symptoms get worse even though you have been following your caregiver's orders.  If you have any other questions or concerns.   This information is not intended to replace advice given to you by your health care provider. Make sure you discuss any questions you have with your health care provider.   Document Released: 07/25/2000 Document Revised: 04/23/2011 Document Reviewed: 10/05/2014 Elsevier Interactive Patient Education 2016 [employer]. Diabetes Mellitus and Food It is important for you to manage your blood sugar (glucose) level. Your blood glucose level can be greatly affected by what you eat. Eating healthier foods in the appropriate amounts throughout the day at about the same time  each day will help you control your blood glucose level. It can also help slow or prevent worsening of your diabetes mellitus. Healthy eating may even help you improve the level of your blood pressure and reach or maintain a healthy weight.  General recommendations for healthful eating and cooking habits include:  Eating meals and snacks regularly.  Avoid going long periods of time without eating to lose weight.  Eating a diet that consists mainly of plant-based foods, such as fruits, vegetables, nuts, legumes, and whole grains.  Using low-heat cooking methods, such as baking, instead of high-heat cooking methods, such as deep frying. Work with your dietitian to make sure you understand how to use the Nutrition Facts information on food labels. HOW CAN FOOD AFFECT ME? Carbohydrates Carbohydrates affect your blood glucose level more than any other type of food. Your dietitian will help you determine how many carbohydrates to eat at each meal and teach you how to count carbohydrates. Counting carbohydrates is important to keep your blood glucose at a healthy level, especially if you are using insulin or taking certain medicines for diabetes mellitus. Alcohol Alcohol can cause sudden decreases in blood glucose (hypoglycemia), especially if you use insulin or take certain medicines for diabetes mellitus. Hypoglycemia can be a life-threatening condition. Symptoms of hypoglycemia (sleepiness, dizziness, and disorientation) are similar to symptoms of having too much alcohol.  If your health care provider has given you approval to drink alcohol, do so in moderation and use the following guidelines:  Women should not have more than one drink per day, and men should not have more than two drinks per day. One drink is equal to:  12 oz of beer.  5 oz of wine.  1 oz of hard liquor.  Do not drink on an empty stomach.  Keep yourself hydrated. Have water, diet soda, or unsweetened iced tea.  Regular soda, juice, and other mixers might contain a lot of carbohydrates and should be counted. WHAT FOODS ARE NOT RECOMMENDED? As you make food choices, it is important to remember that all foods are not the same. Some foods have fewer nutrients per serving than other foods, even though they might have the same number of calories or carbohydrates. It is  difficult to get your body what it needs when you eat foods with fewer nutrients. Examples of foods that you should avoid that are high in calories and carbohydrates but low in nutrients include:  Trans fats (most processed foods list trans fats on the Nutrition Facts label).  Regular soda.  Juice.  Candy.  Sweets, such as cake, pie, doughnuts, and cookies.  Fried foods. WHAT FOODS CAN I EAT? Eat nutrient-rich foods, which will nourish your body and keep you healthy. The food you should eat also will depend on several factors, including:  The calories you need.  The medicines you take.  Your weight.  Your blood glucose level.  Your blood pressure level.  Your cholesterol level. You should eat a variety of foods, including:  Protein.  Lean cuts of meat.  Proteins low in saturated fats, such as fish, egg whites, and beans. Avoid processed meats.  Fruits and vegetables.  Fruits and vegetables that may help control blood glucose levels, such as apples, mangoes, and yams.  Dairy products.  Choose fat-free or low-fat dairy products, such as milk, yogurt, and cheese.  Grains, bread, pasta, and Rotter.  Choose whole grain products, such as multigrain bread, whole oats, and brown Levit. These foods may help control  blood pressure.  Fats.  Foods containing healthful fats, such as nuts, avocado, olive oil, canola oil, and fish. DOES EVERYONE WITH DIABETES MELLITUS HAVE THE SAME MEAL PLAN? Because every person with diabetes mellitus is different, there is not one meal plan that works for everyone. It is very important that you meet with a dietitian who will help you create a meal plan that is just right for you.   This information is not intended to replace advice given to you by your health care provider. Make sure you discuss any questions you have with your health care provider.   Document Released: 10/26/2004 Document Revised: 02/19/2014 Document Reviewed:  12/26/2012 Elsevier Interactive Patient Education [employer].

## 2015-01-29 NOTE — ED Notes (Addendum)
Pt reports he has been sick with sinus infection and is on antibiotics; pt is type 2 DM and takes metformin as prescribed; pt says this is the highest his blood sugar has been and he usually stays in 150s; denies polyuria, polyphagia, nor polydipsia; pt states he ate extra carbs today and thiks that may be why his CBG is elevated today

## 2015-01-29 NOTE — ED Notes (Signed)
MD at bedside. 

## 2015-02-01 ENCOUNTER — Encounter: Payer: Self-pay | Admitting: Internal Medicine

## 2015-02-01 ENCOUNTER — Ambulatory Visit (INDEPENDENT_AMBULATORY_CARE_PROVIDER_SITE_OTHER): Payer: BLUE CROSS/BLUE SHIELD | Admitting: Internal Medicine

## 2015-02-01 VITALS — BP 112/70 | HR 102 | Temp 98.5°F | Ht 74.0 in | Wt 220.0 lb

## 2015-02-01 DIAGNOSIS — Z0189 Encounter for other specified special examinations: Secondary | ICD-10-CM | POA: Diagnosis not present

## 2015-02-01 DIAGNOSIS — J019 Acute sinusitis, unspecified: Secondary | ICD-10-CM

## 2015-02-01 DIAGNOSIS — E785 Hyperlipidemia, unspecified: Secondary | ICD-10-CM | POA: Diagnosis not present

## 2015-02-01 DIAGNOSIS — Z0000 Encounter for general adult medical examination without abnormal findings: Secondary | ICD-10-CM

## 2015-02-01 DIAGNOSIS — E119 Type 2 diabetes mellitus without complications: Secondary | ICD-10-CM

## 2015-02-01 LAB — GLUCOSE, POCT (MANUAL RESULT ENTRY): POC GLUCOSE: 288 mg/dL — AB (ref 70–99)

## 2015-02-01 MED ORDER — METFORMIN HCL 500 MG PO TABS: 500.0000 mg | ORAL_TABLET | Freq: Two times a day (BID) | ORAL | Status: DC

## 2015-02-01 MED ORDER — GLIPIZIDE ER 5 MG PO TB24: 5.0000 mg | ORAL_TABLET | Freq: Every day | ORAL | Status: DC

## 2015-02-01 NOTE — Patient Instructions (Addendum)
Ok to increase the metformin to twice per day (500 mg each time); please watch for stomach upset that continues every day  Please take all new medication as prescribed - the glipizide ER 5 mg  Please check your sugars at least 2-3 times per day  Please call on Friday Dec 24 or the next Tuesday with your blood sugar results  Please continue all other medications as before, and refills have been done if requested.  Please have the pharmacy call with any other refills you may need.  Please continue your efforts at being more active, low cholesterol diabetic diet, and weight control.  Please keep your appointments with your specialists as you may have planned  Please return in 6 weeks, or sooner if needed, with Lab testing done 3-5 days before

## 2015-02-01 NOTE — Assessment & Plan Note (Signed)
Resolve, no further tx needed,  to f/u any worsening symptoms or concerns

## 2015-02-01 NOTE — Progress Notes (Signed)
Pre visit review using our clinic review tool, if applicable. No additional management support is needed unless otherwise documented below in the visit note. 

## 2015-02-01 NOTE — Progress Notes (Signed)
Subjective:    Patient ID: William Sheppard, male    DOB: 1970/12/01, 44 y.o.   MRN: [id number]  HPI  Here to f/u after seen in ER with recurrence of symptoms he had before of high sugar with polydipsia, polyuria.  Finished levaquin for sinus infection and improved, last dose last Friday, and symptoms resolved, but sugars still high. No change in diet, activity at work and home, and good compliance with metformin. Pt denies chest pain, increased sob or doe, wheezing, orthopnea, PND, increased LE swelling, palpitations, dizziness or syncope.  Pt denies new neurological symptoms such as new headache, or facial or extremity weakness or numbness  Lost 5 lbs with onset polys.  Past Medical History  Diagnosis Date  . Diabetes mellitus   . Chronic headaches   . Hyperlipidemia   . Non Hodgkin's lymphoma [facility])    Past Surgical History  Procedure Laterality Date  . Portacath placement  1999  . Wisdom tooth extraction      reports that he has never smoked. He does not have any smokeless tobacco history on file. He reports that he drinks alcohol. He reports that he does not use illicit drugs. family history is not on file. No Known Allergies Current Outpatient Prescriptions on File Prior to Visit  Medication Sig Dispense Refill  . aspirin 81 MG EC tablet Take 1 tablet (81 mg total) by mouth daily. Swallow whole. 30 tablet 12  . glucose blood test strip 1 each by Other route daily. And lancets 1/day 250.00    . LANCETS ULTRA THIN MISC   0  . lisinopril (PRINIVIL,ZESTRIL) 5 MG tablet TAKE 1 TABLET EACH DAY. 30 tablet 11  . metFORMIN (GLUCOPHAGE) 500 MG tablet TAKE 1 TABLET EACH DAY. 30 tablet 11  . omeprazole (PRILOSEC) 20 MG capsule Take 1 capsule (20 mg total) by mouth daily. 90 capsule 3  . pravastatin (PRAVACHOL) 20 MG tablet TAKE 1 TABLET EACH DAY. 30 tablet 11  . HYDROcodone-homatropine (HYCODAN) 5-1.5 MG/5ML syrup Take 5 mLs by mouth every 6 (six) hours as needed for cough. (Patient not taking:  Reported on 01/29/2015) 180 mL 0   No current facility-administered medications on file prior to visit.   Review of Systems  Constitutional: Negative for unusual diaphoresis or night sweats HENT: Negative for ringing in ear or discharge Eyes: Negative for double vision or worsening visual disturbance.  Respiratory: Negative for choking and stridor.   Gastrointestinal: Negative for vomiting or other signifcant bowel change Genitourinary: Negative for hematuria or change in urine volume.  Musculoskeletal: Negative for other MSK pain or swelling Skin: Negative for color change and worsening wound.  Neurological: Negative for tremors and numbness other than noted  Psychiatric/Behavioral: Negative for decreased concentration or agitation other than above       Objective:   Physical Exam BP 112/70 mmHg  Pulse 102  Temp(Src) 98.5 F (36.9 C) (Oral)  Ht 6\' 2"  (1.88 m)  Wt 220 lb (99.791 kg)  BMI 28.23 kg/m2  SpO2 96% VS noted,  Constitutional: Pt appears in no significant distress HENT: Head: NCAT.  Right Ear: External ear normal.  Left Ear: External ear normal.  Eyes: . Pupils are equal, round, and reactive to light. Conjunctivae and EOM are normal Neck: Normal range of motion. Neck supple.  Cardiovascular: Normal rate and regular rhythm.   Pulmonary/Chest: Effort normal and breath sounds without rales or wheezing.  Abd:  Soft, NT, ND, + BS Neurological: Pt is alert. Not confused ,  motor grossly intact Skin: Skin is warm. No rash, no LE edema Psychiatric: Pt behavior is normal. No agitation.    CBG 288 today in office     Assessment & Plan:

## 2015-02-03 ENCOUNTER — Telehealth: Payer: Self-pay | Admitting: Internal Medicine

## 2015-02-03 ENCOUNTER — Emergency Department (HOSPITAL_COMMUNITY)
Admission: EM | Admit: 2015-02-03 | Discharge: 2015-02-03 | Disposition: A | Discharge status: Home / Self Care | Payer: BLUE CROSS/BLUE SHIELD | Attending: Emergency Medicine | Admitting: Emergency Medicine

## 2015-02-03 ENCOUNTER — Encounter (HOSPITAL_COMMUNITY): Payer: Self-pay | Admitting: Emergency Medicine

## 2015-02-03 DIAGNOSIS — Z7984 Long term (current) use of oral hypoglycemic drugs: Secondary | ICD-10-CM | POA: Diagnosis not present

## 2015-02-03 DIAGNOSIS — E785 Hyperlipidemia, unspecified: Secondary | ICD-10-CM | POA: Insufficient documentation

## 2015-02-03 DIAGNOSIS — E1165 Type 2 diabetes mellitus with hyperglycemia: Secondary | ICD-10-CM | POA: Insufficient documentation

## 2015-02-03 DIAGNOSIS — Z7982 Long term (current) use of aspirin: Secondary | ICD-10-CM | POA: Insufficient documentation

## 2015-02-03 DIAGNOSIS — G8929 Other chronic pain: Secondary | ICD-10-CM | POA: Diagnosis not present

## 2015-02-03 DIAGNOSIS — Z8572 Personal history of non-Hodgkin lymphomas: Secondary | ICD-10-CM | POA: Insufficient documentation

## 2015-02-03 LAB — CBC
HCT: 41.8 % (ref 39.0–52.0)
Hemoglobin: 13.4 g/dL (ref 13.0–17.0)
MCH: 22.3 pg — ABNORMAL LOW (ref 26.0–34.0)
MCHC: 32.1 g/dL (ref 30.0–36.0)
MCV: 69.4 fL — AB (ref 78.0–100.0)
PLATELETS: 227 10*3/uL (ref 150–400)
RBC: 6.02 MIL/uL — ABNORMAL HIGH (ref 4.22–5.81)
RDW: 14.3 % (ref 11.5–15.5)
WBC: 4.6 10*3/uL (ref 4.0–10.5)

## 2015-02-03 LAB — BASIC METABOLIC PANEL
Anion gap: 9 (ref 5–15)
BUN: 24 mg/dL — AB (ref 6–20)
CALCIUM: 9.8 mg/dL (ref 8.9–10.3)
CHLORIDE: 104 mmol/L (ref 101–111)
CO2: 26 mmol/L (ref 22–32)
CREATININE: 1.13 mg/dL (ref 0.61–1.24)
GFR calc Af Amer: >60 mL/min (ref >60)
GFR calc non Af Amer: >60 mL/min (ref >60)
Glucose, Bld: 256 mg/dL — ABNORMAL HIGH (ref 65–99)
Potassium: 4.3 mmol/L (ref 3.5–5.1)
SODIUM: 139 mmol/L (ref 135–145)

## 2015-02-03 LAB — CBG MONITORING, ED
Glucose-Capillary: 337 mg/dL — ABNORMAL HIGH (ref 65–99)
Glucose-Capillary: 217 mg/dL — ABNORMAL HIGH (ref 65–99)

## 2015-02-03 MED ORDER — SODIUM CHLORIDE 0.9 % IV SOLN
1000.0000 mL | Freq: Once | INTRAVENOUS | Status: AC
  Administered 2015-02-03: 1000 mL via INTRAVENOUS

## 2015-02-03 MED ORDER — SODIUM CHLORIDE 0.9 % IV SOLN
1000.0000 mL | INTRAVENOUS | Status: DC
  Administered 2015-02-03: 1000 mL via INTRAVENOUS

## 2015-02-03 NOTE — Telephone Encounter (Signed)
Pt advised to go to UC/ER

## 2015-02-03 NOTE — ED Notes (Signed)
Pt states that his blood sugar for 400s.  Pt is type 2 diabetic.  Pt takes metformin and states that he recently started on Glipizide.

## 2015-02-03 NOTE — ED Notes (Signed)
Pt states he only want to be stuck once.  He is requesting blood draw during start of IV

## 2015-02-03 NOTE — ED Provider Notes (Signed)
CSN: [id number]     Arrival date & time 02/03/15  1816 History   First MD Initiated Contact with Patient 02/03/15 2123     Chief Complaint  Patient presents with  . Hyperglycemia     (Consider location/radiation/quality/duration/timing/severity/associated sxs/prior Treatment) HPI   44 year old male with history of non-insulin-dependent diabetes, non-Hodgkin lymphoma, and chronic headache presenting today with concerns of hyperglycemia. Patient states he is a type II diabetic and he has noticed that his blood sugars been running in the 400s. Patient mentioned he was taken metformin and recently was started on glipizide approximately 2 days ago. During the past week he check his CBG intermittently and is usually running between 200 to 300s. Today he check his blood sugar 3 hours after eating lunch and noticed that it was 460 which concerns him. He did had an upper respiratory infection several weeks ago which has since resolved. He denies having any fever, chills, headache, URI symptoms, chest pain, shortness of breath, abdominal pain, generalized fatigue, focal numbness or weakness, polyuria or polydipsia. He actually felt more energetic today. He has no other complaint.  Past Medical History  Diagnosis Date  . Diabetes mellitus   . Chronic headaches   . Hyperlipidemia   . Non Hodgkin's lymphoma [facility])    Past Surgical History  Procedure Laterality Date  . Portacath placement  1999  . Wisdom tooth extraction     No family history on file. Social History  Substance Use Topics  . Smoking status: Never Smoker   . Smokeless tobacco: None  . Alcohol Use: Yes     Comment: social    Review of Systems  All other systems reviewed and are negative.     Allergies  Review of patient's allergies indicates no known allergies.  Home Medications   Prior to Admission medications   Medication Sig Start Date End Date Taking? Authorizing Provider  aspirin 81 MG EC tablet Take 1 tablet (81  mg total) by mouth daily. Swallow whole. 05/07/12  Yes Biagio Borg, MD  glipiZIDE (GLUCOTROL XL) 5 MG 24 hr tablet Take 1 tablet (5 mg total) by mouth daily with breakfast. 02/01/15  Yes Biagio Borg, MD  lisinopril (PRINIVIL,ZESTRIL) 5 MG tablet TAKE 1 TABLET EACH DAY. 12/16/14  Yes Biagio Borg, MD  metFORMIN (GLUCOPHAGE) 500 MG tablet Take 1 tablet (500 mg total) by mouth 2 (two) times daily with a meal. 02/01/15  Yes Biagio Borg, MD  pravastatin (PRAVACHOL) 20 MG tablet TAKE 1 TABLET EACH DAY. 12/16/14  Yes Biagio Borg, MD  glucose blood test strip 1 each by Other route daily. And lancets 1/day 250.00 09/16/12   Biagio Borg, MD  HYDROcodone-homatropine [facility]) 5-1.5 MG/5ML syrup Take 5 mLs by mouth every 6 (six) hours as needed for cough. Patient not taking: Reported on 01/29/2015 01/19/15   Biagio Borg, MD  [facility] [place]  07/07/14   Historical Provider, MD  omeprazole (PRILOSEC) 20 MG capsule Take 1 capsule (20 mg total) by mouth daily. 11/04/13   Biagio Borg, MD   BP 163/89 mmHg  Pulse 101  Temp(Src) 98.6 F (37 C) (Oral)  Resp 16  SpO2 97% Physical Exam  Constitutional: He is oriented to person, place, and time. He appears well-developed and well-nourished. No distress.  African-American male appears to be in no acute distress.  HENT:  Head: Atraumatic.  Mouth/Throat: Oropharynx is clear and moist.  Eyes: Conjunctivae are normal.  Neck: Neck supple.  Cardiovascular: Normal rate and regular rhythm.   Pulmonary/Chest: Effort normal and breath sounds normal.  Abdominal: Soft. There is no tenderness.  Neurological: He is alert and oriented to person, place, and time.  Skin: No rash noted.  Psychiatric: He has a normal mood and affect.  Nursing note and vitals reviewed.   ED Course  Procedures (including critical care time) Labs Review Labs Reviewed  BASIC METABOLIC PANEL - Abnormal; Notable for the following:    Glucose, Bld 256 (*)    BUN 24 (*)    All other  components within normal limits  CBC - Abnormal; Notable for the following:    RBC 6.02 (*)    MCV 69.4 (*)    MCH 22.3 (*)    All other components within normal limits  CBG MONITORING, ED - Abnormal; Notable for the following:    Glucose-Capillary 337 (*)    All other components within normal limits  URINALYSIS, ROUTINE W REFLEX MICROSCOPIC (NOT AT [facility])  CBG MONITORING, ED      MDM   Final diagnoses:  Type 2 diabetes mellitus with hyperglycemia, without long-term current use of insulin (HCC)    BP 163/89 mmHg  Pulse 101  Temp(Src) 98.6 F (37 C) (Oral)  Resp 16  SpO2 97%   9:53 PM Patient presents with concerns for hypoglycemia when his blood sugar was 460 earlier today. He is currently at baseline, he has no symptoms of infection or stress.  Plan to provide IVF and obtain basic labs.  Will monitor and ensure pt is not in DKA.    11:15 PM Pt received IVF.  CBG improves.  No evidence of DKA.  Pt stable for discharge and will f/u with PCP for further care.  Return precaution discussed.   Domenic Moras, PA-C 02/03/15 2316  Milton Ferguson, MD 02/03/15 [phone]

## 2015-02-03 NOTE — Discharge Instructions (Signed)
Continue taking your medications as prescribed.  Monitor your diet carefully and exercise regularly.  Follow up with your doctor for further management of your diabetes.    Type 2 Diabetes Mellitus, Adult Type 2 diabetes mellitus, often simply referred to as type 2 diabetes, is a long-lasting (chronic) disease. In type 2 diabetes, the pancreas does not make enough insulin (a hormone), the cells are less responsive to the insulin that is made (insulin resistance), or both. Normally, insulin moves sugars from food into the tissue cells. The tissue cells use the sugars for energy. The lack of insulin or the lack of normal response to insulin causes excess sugars to build up in the blood instead of going into the tissue cells. As a result, high blood sugar (hyperglycemia) develops. The effect of high sugar (glucose) levels can cause many complications. Type 2 diabetes was also previously called adult-onset diabetes, but it can occur at any age.  RISK FACTORS  A person is predisposed to developing type 2 diabetes if someone in the family has the disease and also has one or more of the following primary risk factors:  Weight gain, or being overweight or obese.  An inactive lifestyle.  A history of consistently eating high-calorie foods. Maintaining a normal weight and regular physical activity can reduce the chance of developing type 2 diabetes. SYMPTOMS  A person with type 2 diabetes may not show symptoms initially. The symptoms of type 2 diabetes appear slowly. The symptoms include:  Increased thirst (polydipsia).  Increased urination (polyuria).  Increased urination during the night (nocturia).  Sudden or unexplained weight changes.  Frequent, recurring infections.  Tiredness (fatigue).  Weakness.  Vision changes, such as blurred vision.  Fruity smell to your breath.  Abdominal pain.  Nausea or vomiting.  Cuts or bruises which are slow to heal.  Tingling or numbness in the  hands or feet.  An open skin wound (ulcer). DIAGNOSIS Type 2 diabetes is frequently not diagnosed until complications of diabetes are present. Type 2 diabetes is diagnosed when symptoms or complications are present and when blood glucose levels are increased. Your blood glucose level may be checked by one or more of the following blood tests:  A fasting blood glucose test. You will not be allowed to eat for at least 8 hours before a blood sample is taken.  A random blood glucose test. Your blood glucose is checked at any time of the day regardless of when you ate.  A hemoglobin A1c blood glucose test. A hemoglobin A1c test provides information about blood glucose control over the previous 3 months.  An oral glucose tolerance test (OGTT). Your blood glucose is measured after you have not eaten (fasted) for 2 hours and then after you drink a glucose-containing beverage. TREATMENT   You may need to take insulin or diabetes medicine daily to keep blood glucose levels in the desired range.  If you use insulin, you may need to adjust the dosage depending on the carbohydrates that you eat with each meal or snack.  Lifestyle changes are recommended as part of your treatment. These may include:  Following an individualized diet plan developed by a nutritionist or dietitian.  Exercising daily. Your health care providers will set individualized treatment goals for you based on your age, your medicines, how long you have had diabetes, and any other medical conditions you have. Generally, the goal of treatment is to maintain the following blood glucose levels:  Before meals (preprandial): 80-130 mg/dL.  After meals (  postprandial): below 180 mg/dL.  A1c: less than 6.5-7%. HOME CARE INSTRUCTIONS   Have your hemoglobin A1c level checked twice a year.  Perform daily blood glucose monitoring as directed by your health care provider.  Monitor urine ketones when you are ill and as directed by your  health care provider.  Take your diabetes medicine or insulin as directed by your health care provider to maintain your blood glucose levels in the desired range.  Never run out of diabetes medicine or insulin. It is needed every day.  If you are using insulin, you may need to adjust the amount of insulin given based on your intake of carbohydrates. Carbohydrates can raise blood glucose levels but need to be included in your diet. Carbohydrates provide vitamins, minerals, and fiber which are an essential part of a healthy diet. Carbohydrates are found in fruits, vegetables, whole grains, dairy products, legumes, and foods containing added sugars.  Eat healthy foods. You should make an appointment to see a registered dietitian to help you create an eating plan that is right for you.  Lose weight if you are overweight.  Carry a medical alert card or wear your medical alert jewelry.  Carry a 15-gram carbohydrate snack with you at all times to treat low blood glucose (hypoglycemia). Some examples of 15-gram carbohydrate snacks include:  Glucose tablets, 3 or 4.  Glucose gel, 15-gram tube.  Raisins, 2 tablespoons (24 grams).  Jelly beans, 6.  Animal crackers, 8.  Regular pop, 4 ounces (120 mL).  Gummy treats, 9.  Recognize hypoglycemia. Hypoglycemia occurs with blood glucose levels of 70 mg/dL and below. The risk for hypoglycemia increases when fasting or skipping meals, during or after intense exercise, and during sleep. Hypoglycemia symptoms can include:  Tremors or shakes.  Decreased ability to concentrate.  Sweating.  Increased heart rate.  Headache.  Dry mouth.  Hunger.  Irritability.  Anxiety.  Restless sleep.  Altered speech or coordination.  Confusion.  Treat hypoglycemia promptly. If you are alert and able to safely swallow, follow the 15:15 rule:  Take 15-20 grams of rapid-acting glucose or carbohydrate. Rapid-acting options include glucose gel, glucose  tablets, or 4 ounces (120 mL) of fruit juice, regular soda, or low-fat milk.  Check your blood glucose level 15 minutes after taking the glucose.  Take 15-20 grams more of glucose if the repeat blood glucose level is still 70 mg/dL or below.  Eat a meal or snack within 1 hour once blood glucose levels return to normal.  Be alert to feeling very thirsty and urinating more frequently than usual, which are early signs of hyperglycemia. An early awareness of hyperglycemia allows for prompt treatment. Treat hyperglycemia as directed by your health care provider.  Engage in at least 150 minutes of moderate-intensity physical activity a week, spread over at least 3 days of the week or as directed by your health care provider. In addition, you should engage in resistance exercise at least 2 times a week or as directed by your health care provider. Try to spend no more than 90 minutes at one time inactive.  Adjust your medicine and food intake as needed if you start a new exercise or sport.  Follow your sick-day plan anytime you are unable to eat or drink as usual.  Do not use any tobacco products including cigarettes, chewing tobacco, or electronic cigarettes. If you need help quitting, ask your health care provider.  Limit alcohol intake to no more than 1 drink per day for nonpregnant  women and 2 drinks per day for men. You should drink alcohol only when you are also eating food. Talk with your health care provider whether alcohol is safe for you. Tell your health care provider if you drink alcohol several times a week.  Keep all follow-up visits as directed by your health care provider. This is important.  Schedule an eye exam soon after the diagnosis of type 2 diabetes and then annually.  Perform daily skin and foot care. Examine your skin and feet daily for cuts, bruises, redness, nail problems, bleeding, blisters, or sores. A foot exam by a health care provider should be done annually.  Brush  your teeth and gums at least twice a day and floss at least once a day. Follow up with your dentist regularly.  Share your diabetes management plan with your workplace or school.  Keep your immunizations up to date. It is recommended that you receive a flu (influenza) vaccine every year. It is also recommended that you receive a pneumonia (pneumococcal) vaccine. If you are 11 years of age or older and have never received a pneumonia vaccine, this vaccine may be given as a series of two separate shots. Ask your health care provider which additional vaccines may be recommended.  Learn to manage stress.  Obtain ongoing diabetes education and support as needed.  Participate in or seek rehabilitation as needed to maintain or improve independence and quality of life. Request a physical or occupational therapy referral if you are having foot or hand numbness, or difficulties with grooming, dressing, eating, or physical activity. SEEK MEDICAL CARE IF:   You are unable to eat food or drink fluids for more than 6 hours.  You have nausea and vomiting for more than 6 hours.  Your blood glucose level is over 240 mg/dL.  There is a change in mental status.  You develop an additional serious illness.  You have diarrhea for more than 6 hours.  You have been sick or have had a fever for a couple of days and are not getting better.  You have pain during any physical activity.  SEEK IMMEDIATE MEDICAL CARE IF:  You have difficulty breathing.  You have moderate to large ketone levels.   This information is not intended to replace advice given to you by your health care provider. Make sure you discuss any questions you have with your health care provider.   Document Released: 01/29/2005 Document Revised: 10/20/2014 Document Reviewed: 08/28/2011 Elsevier Interactive Patient Education 2016 [employer].  Diabetes Mellitus and Food It is important for you to manage your blood sugar (glucose) level.  Your blood glucose level can be greatly affected by what you eat. Eating healthier foods in the appropriate amounts throughout the day at about the same time each day will help you control your blood glucose level. It can also help slow or prevent worsening of your diabetes mellitus. Healthy eating may even help you improve the level of your blood pressure and reach or maintain a healthy weight.  General recommendations for healthful eating and cooking habits include:  Eating meals and snacks regularly. Avoid going long periods of time without eating to lose weight.  Eating a diet that consists mainly of plant-based foods, such as fruits, vegetables, nuts, legumes, and whole grains.  Using low-heat cooking methods, such as baking, instead of high-heat cooking methods, such as deep frying. Work with your dietitian to make sure you understand how to use the Nutrition Facts information on food labels. HOW  CAN FOOD AFFECT ME? Carbohydrates Carbohydrates affect your blood glucose level more than any other type of food. Your dietitian will help you determine how many carbohydrates to eat at each meal and teach you how to count carbohydrates. Counting carbohydrates is important to keep your blood glucose at a healthy level, especially if you are using insulin or taking certain medicines for diabetes mellitus. Alcohol Alcohol can cause sudden decreases in blood glucose (hypoglycemia), especially if you use insulin or take certain medicines for diabetes mellitus. Hypoglycemia can be a life-threatening condition. Symptoms of hypoglycemia (sleepiness, dizziness, and disorientation) are similar to symptoms of having too much alcohol.  If your health care provider has given you approval to drink alcohol, do so in moderation and use the following guidelines:  Women should not have more than one drink per day, and men should not have more than two drinks per day. One drink is equal to:  12 oz of beer.  5 oz of  wine.  1 oz of hard liquor.  Do not drink on an empty stomach.  Keep yourself hydrated. Have water, diet soda, or unsweetened iced tea.  Regular soda, juice, and other mixers might contain a lot of carbohydrates and should be counted. WHAT FOODS ARE NOT RECOMMENDED? As you make food choices, it is important to remember that all foods are not the same. Some foods have fewer nutrients per serving than other foods, even though they might have the same number of calories or carbohydrates. It is difficult to get your body what it needs when you eat foods with fewer nutrients. Examples of foods that you should avoid that are high in calories and carbohydrates but low in nutrients include:  Trans fats (most processed foods list trans fats on the Nutrition Facts label).  Regular soda.  Juice.  Candy.  Sweets, such as cake, pie, doughnuts, and cookies.  Fried foods. WHAT FOODS CAN I EAT? Eat nutrient-rich foods, which will nourish your body and keep you healthy. The food you should eat also will depend on several factors, including:  The calories you need.  The medicines you take.  Your weight.  Your blood glucose level.  Your blood pressure level.  Your cholesterol level. You should eat a variety of foods, including:  Protein.  Lean cuts of meat.  Proteins low in saturated fats, such as fish, egg whites, and beans. Avoid processed meats.  Fruits and vegetables.  Fruits and vegetables that may help control blood glucose levels, such as apples, mangoes, and yams.  Dairy products.  Choose fat-free or low-fat dairy products, such as milk, yogurt, and cheese.  Grains, bread, pasta, and [place].  Choose whole grain products, such as multigrain bread, whole oats, and brown [place]. These foods may help control blood pressure.  Fats.  Foods containing healthful fats, such as nuts, avocado, olive oil, canola oil, and fish. DOES EVERYONE WITH DIABETES MELLITUS HAVE THE SAME MEAL  PLAN? Because every person with diabetes mellitus is different, there is not one meal plan that works for everyone. It is very important that you meet with a dietitian who will help you create a meal plan that is just right for you.   This information is not intended to replace advice given to you by your health care provider. Make sure you discuss any questions you have with your health care provider.   Document Released: 10/26/2004 Document Revised: 02/19/2014 Document Reviewed: 12/26/2012 Elsevier Interactive Patient Education [employer].

## 2015-02-03 NOTE — Telephone Encounter (Signed)
Just checked glucose at few minutes ago.  It was 416.  This is two hours after he ate and had some M&M's in between.  Has not had soda.  He is not have any symptoms and is not dizzy.  He would like to know if he needs to be concerned about this.  Please follow up with patient.

## 2015-02-07 NOTE — Assessment & Plan Note (Signed)
For increased OHA tx with uncontrolled sugars, o/w stable overall by history and exam, recent data reviewed with pt, and pt to continue medical treatment as before,  to f/u any worsening symptoms or concerns\ Lab Results  Component Value Date   HGBA1C 7.1* 07/22/2014

## 2015-02-07 NOTE — Assessment & Plan Note (Signed)
stable overall by history and exam, recent data reviewed with pt, and pt to continue medical treatment as before,  to f/u any worsening symptoms or concerns Lab Results  Component Value Date   LDLCALC 72 11/05/2013    

## 2015-02-14 ENCOUNTER — Encounter: Payer: Self-pay | Admitting: Internal Medicine

## 2015-02-15 ENCOUNTER — Telehealth: Payer: Self-pay | Admitting: *Deleted

## 2015-02-15 ENCOUNTER — Other Ambulatory Visit: Payer: Self-pay | Admitting: *Deleted

## 2015-02-15 MED ORDER — METFORMIN HCL 1000 MG PO TABS: 1000.0000 mg | ORAL_TABLET | Freq: Two times a day (BID) | ORAL | Status: DC

## 2015-02-15 NOTE — Telephone Encounter (Signed)
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  Per message he states " coughing and feeling a little dizzy ".  Return call number given as [phone].

## 2015-02-16 ENCOUNTER — Other Ambulatory Visit: Payer: Self-pay | Admitting: *Deleted

## 2015-02-17 ENCOUNTER — Telehealth: Payer: Self-pay | Admitting: Oncology

## 2015-02-17 NOTE — Telephone Encounter (Signed)
Left message re appointments for 1/25 and 1/27. Schedule mailed.

## 2015-02-25 ENCOUNTER — Encounter: Payer: Self-pay | Admitting: Internal Medicine

## 2015-02-28 ENCOUNTER — Encounter: Payer: Self-pay | Admitting: Internal Medicine

## 2015-03-01 ENCOUNTER — Telehealth: Payer: Self-pay | Admitting: Internal Medicine

## 2015-03-01 ENCOUNTER — Encounter: Payer: Self-pay | Admitting: Internal Medicine

## 2015-03-01 NOTE — Telephone Encounter (Signed)
Faxed letter for pt to return to exercise to [phone]

## 2015-03-01 NOTE — Telephone Encounter (Signed)
Done hardcopy to Dahlia  

## 2015-03-08 ENCOUNTER — Other Ambulatory Visit: Payer: Self-pay

## 2015-03-08 DIAGNOSIS — C8590 Non-Hodgkin lymphoma, unspecified, unspecified site: Secondary | ICD-10-CM

## 2015-03-09 ENCOUNTER — Other Ambulatory Visit (HOSPITAL_BASED_OUTPATIENT_CLINIC_OR_DEPARTMENT_OTHER): Payer: BLUE CROSS/BLUE SHIELD

## 2015-03-09 DIAGNOSIS — C8590 Non-Hodgkin lymphoma, unspecified, unspecified site: Secondary | ICD-10-CM | POA: Diagnosis not present

## 2015-03-09 LAB — CBC WITH DIFFERENTIAL/PLATELET
BASO%: 0.4 % (ref 0.0–2.0)
Basophils Absolute: 0 10*3/uL (ref 0.0–0.1)
EOS%: 1.5 % (ref 0.0–7.0)
Eosinophils Absolute: 0.1 10*3/uL (ref 0.0–0.5)
HCT: 40.2 % (ref 38.4–49.9)
HGB: 12.4 g/dL — ABNORMAL LOW (ref 13.0–17.1)
LYMPH%: 54.3 % — AB (ref 14.0–49.0)
MCH: 21.3 pg — ABNORMAL LOW (ref 27.2–33.4)
MCHC: 30.9 g/dL — AB (ref 32.0–36.0)
MCV: 69 fL — AB (ref 79.3–98.0)
MONO#: 0.3 10*3/uL (ref 0.1–0.9)
MONO%: 8 % (ref 0.0–14.0)
NEUT%: 35.8 % — AB (ref 39.0–75.0)
NEUTROS ABS: 1.3 10*3/uL — AB (ref 1.5–6.5)
PLATELETS: 208 10*3/uL (ref 140–400)
RBC: 5.82 10*6/uL (ref 4.20–5.82)
RDW: 15 % — ABNORMAL HIGH (ref 11.0–14.6)
WBC: 3.7 10*3/uL — AB (ref 4.0–10.3)
lymph#: 2 10*3/uL (ref 0.9–3.3)

## 2015-03-09 LAB — COMPREHENSIVE METABOLIC PANEL
ALT: 37 U/L (ref 0–55)
ANION GAP: 7 meq/L (ref 3–11)
AST: 17 U/L (ref 5–34)
Albumin: 4 g/dL (ref 3.5–5.0)
Alkaline Phosphatase: 54 U/L (ref 40–150)
BUN: 20 mg/dL (ref 7.0–26.0)
CHLORIDE: 109 meq/L (ref 98–109)
CO2: 25 meq/L (ref 22–29)
Calcium: 9.4 mg/dL (ref 8.4–10.4)
Creatinine: 1.2 mg/dL (ref 0.7–1.3)
EGFR: 85 mL/min/{1.73_m2} — AB (ref >90)
GLUCOSE: 180 mg/dL — AB (ref 70–140)
Potassium: 4.2 mEq/L (ref 3.5–5.1)
SODIUM: 141 meq/L (ref 136–145)
Total Bilirubin: 0.49 mg/dL (ref 0.20–1.20)
Total Protein: 7.7 g/dL (ref 6.4–8.3)

## 2015-03-11 ENCOUNTER — Encounter: Payer: Self-pay | Admitting: Oncology

## 2015-03-11 ENCOUNTER — Telehealth: Payer: Self-pay | Admitting: Oncology

## 2015-03-11 ENCOUNTER — Ambulatory Visit (HOSPITAL_BASED_OUTPATIENT_CLINIC_OR_DEPARTMENT_OTHER): Payer: BLUE CROSS/BLUE SHIELD | Admitting: Oncology

## 2015-03-11 VITALS — HR 76 | Temp 98.0°F | Resp 18 | Ht 74.0 in | Wt 221.9 lb

## 2015-03-11 DIAGNOSIS — I10 Essential (primary) hypertension: Secondary | ICD-10-CM

## 2015-03-11 DIAGNOSIS — D560 Alpha thalassemia: Secondary | ICD-10-CM

## 2015-03-11 DIAGNOSIS — Z8572 Personal history of non-Hodgkin lymphomas: Secondary | ICD-10-CM | POA: Diagnosis not present

## 2015-03-11 DIAGNOSIS — D569 Thalassemia, unspecified: Secondary | ICD-10-CM | POA: Diagnosis not present

## 2015-03-11 DIAGNOSIS — E119 Type 2 diabetes mellitus without complications: Secondary | ICD-10-CM | POA: Diagnosis not present

## 2015-03-11 DIAGNOSIS — C8190 Hodgkin lymphoma, unspecified, unspecified site: Secondary | ICD-10-CM | POA: Insufficient documentation

## 2015-03-11 DIAGNOSIS — C8113 Nodular sclerosis classical Hodgkin lymphoma, intra-abdominal lymph nodes: Secondary | ICD-10-CM

## 2015-03-11 HISTORY — DX: Type 2 diabetes mellitus without complications: E11.9

## 2015-03-11 HISTORY — DX: Alpha thalassemia: D56.0

## 2015-03-11 NOTE — Progress Notes (Signed)
[place]  [phone] [phone]     ID: SOANE WILK DOB: 24-Jul-1970  MR#: [id number]  [id number]  Patient Care Team: Biagio Borg, MD as PCP - General (Internal Medicine) PCP: Cathlean Cower, MD OTHER MD:  CHIEF COMPLAINT: History of Hodgkin's lymphoma, recent upper respiratory infection  CURRENT TREATMENT: Observation   CANCER HISTORY: Kalman has a history of Hodgkin's disease, which was diagnosed by CT-guided mediastinal biopsy October 2000. It was stage IIB. He was treated with standard chemotherapy, ABVD, 6, all treatments completed in May 2001. There has not been any evidence of recurrence  INTERVAL HISTORY: Inez Catalina presents back to the hematology clinic because of concerns following a recent upper respiratory infection. He tells me he developed a cough, with significant sinus drainage sometime in the middle of December. He was treated with antibiotics and prednisone but refused the prednisone because of concerns regarding his diabetes and he says his antibiotics also increased his glucose. So we just "tough it out" he says. By the first week in January he was better and currently he denies any cough, phlegm production, sinus drainage, pleurisy, or shortness of breath.  REVIEW OF SYSTEMS: Fulton Reek has a diagnosis of diabetes mellitus. He tells me his A1c is close to 7. We do have some pretty high readings from a couple of months ago, almost 500. He says things are now better controlled. He is tolerating the metformin without diarrhea. He also has high blood pressure, which is controlled on lisinopril. Aside from these issues a detailed review of systems today was noncontributory  PAST MEDICAL HISTORY: Past Medical History  Diagnosis Date  . Diabetes mellitus   . Chronic headaches   . Hyperlipidemia   . Hodgkin's lymphoma ([place])     PAST SURGICAL HISTORY: Past Surgical History  Procedure Laterality Date  . Portacath placement  1999  .  Wisdom tooth extraction      FAMILY HISTORY No family history on file. The patient's parents are still living, his father being 7 and his mother 55 as of January 2017. The patient had no brothers. One sister. There is no history of cancer in the family.  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 His wife [name]) works as a Dance movement psychotherapist. Son [name] attends [employer], age 66; daughter  [name], 21, is still at home.   ADVANCED DIRECTIVES:  not in place    HEALTH MAINTENANCE: Social History  Substance Use Topics  . Smoking status: Never Smoker   . Smokeless tobacco: Not on file  . Alcohol Use: Yes     Comment: social     Colonoscopy:  PAP:  Bone density:  Lipid panel:  No Known Allergies  Current Outpatient Prescriptions  Medication Sig Dispense Refill  . aspirin 81 MG EC tablet Take 1 tablet (81 mg total) by mouth daily. Swallow whole. 30 tablet 12  . doxycycline (VIBRA-TABS) 100 MG tablet   0  . glipiZIDE (GLUCOTROL XL) 5 MG 24 hr tablet Take 1 tablet (5 mg total) by mouth daily with breakfast. 180 tablet 3  . glucose blood test strip 1 each by Other route daily. And lancets 1/day 250.00    . HYDROcodone-homatropine (HYCODAN) 5-1.5 MG/5ML syrup Take 5 mLs by mouth every 6 (six) hours as needed for cough. 180 mL 0  . LANCETS ULTRA THIN MISC   0  . levofloxacin (LEVAQUIN) 250 MG tablet   0  . lisinopril (PRINIVIL,ZESTRIL) 5 MG  tablet TAKE 1 TABLET EACH DAY. 30 tablet 11  . metFORMIN (GLUCOPHAGE) 1000 MG tablet Take 1 tablet (1,000 mg total) by mouth 2 (two) times daily with a meal. 180 tablet 3  . omeprazole (PRILOSEC) 20 MG capsule Take 1 capsule (20 mg total) by mouth daily. 90 capsule 3  . pravastatin (PRAVACHOL) 20 MG tablet TAKE 1 TABLET EACH DAY. 30 tablet 11   No current facility-administered medications for this visit.    OBJECTIVE: middle-aged white male who appears younger than stated age 20 Vitals:   03/11/15 1028  BP:  127/69  Pulse: 76  Temp: 98 F (36.7 C)  Resp: 18     Body mass index is 28.48 kg/(m^2).    ECOG FS:0 - Asymptomatic  Ocular: Sclerae unicteric, pupils equal, round and reactive to light Ear-nose-throat: Oropharynx clear and moist Lymphatic: No cervical or supraclavicular adenopathy; no axillary or inguinal adenopathy Lungs no rales or rhonchi, good excursion bilaterally Heart regular rate and rhythm, no murmur appreciated Abd soft, nontender, positive bowel sounds MSK no focal spinal tenderness, no joint edema Neuro: non-focal, well-oriented, appropriate affect    LAB RESULTS:  CMP     Component Value Date/Time   NA 141 03/09/2015 1128   NA 139 02/03/2015 2200   K 4.2 03/09/2015 1128   K 4.3 02/03/2015 2200   CL 104 02/03/2015 2200   CO2 25 03/09/2015 1128   CO2 26 02/03/2015 2200   GLUCOSE 180* 03/09/2015 1128   GLUCOSE 256* 02/03/2015 2200   BUN 20.0 03/09/2015 1128   BUN 24* 02/03/2015 2200   CREATININE 1.2 03/09/2015 1128   CREATININE 1.13 02/03/2015 2200   CALCIUM 9.4 03/09/2015 1128   CALCIUM 9.8 02/03/2015 2200   PROT 7.7 03/09/2015 1128   PROT 8.4* 01/29/2015 0045   ALBUMIN 4.0 03/09/2015 1128   ALBUMIN 4.6 01/29/2015 0045   AST 17 03/09/2015 1128   AST 24 01/29/2015 0045   ALT 37 03/09/2015 1128   ALT 49 01/29/2015 0045   ALKPHOS 54 03/09/2015 1128   ALKPHOS 69 01/29/2015 0045   BILITOT 0.49 03/09/2015 1128   BILITOT 0.6 01/29/2015 0045   GFRNONAA >60 02/03/2015 2200   GFRAA >60 02/03/2015 2200    INo results found for: SPEP, UPEP  Lab Results  Component Value Date   WBC 3.7* 03/09/2015   NEUTROABS 1.3* 03/09/2015   HGB 12.4* 03/09/2015   HCT 40.2 03/09/2015   MCV 69.0* 03/09/2015   PLT 208 03/09/2015      Chemistry      Component Value Date/Time   NA 141 03/09/2015 1128   NA 139 02/03/2015 2200   K 4.2 03/09/2015 1128   K 4.3 02/03/2015 2200   CL 104 02/03/2015 2200   CO2 25 03/09/2015 1128   CO2 26 02/03/2015 2200   BUN 20.0  03/09/2015 1128   BUN 24* 02/03/2015 2200   CREATININE 1.2 03/09/2015 1128   CREATININE 1.13 02/03/2015 2200      Component Value Date/Time   CALCIUM 9.4 03/09/2015 1128   CALCIUM 9.8 02/03/2015 2200   ALKPHOS 54 03/09/2015 1128   ALKPHOS 69 01/29/2015 0045   AST 17 03/09/2015 1128   AST 24 01/29/2015 0045   ALT 37 03/09/2015 1128   ALT 49 01/29/2015 0045   BILITOT 0.49 03/09/2015 1128   BILITOT 0.6 01/29/2015 0045       No results found for: LABCA2  No components found for: [id number]  No results for input(s): INR in the last  168 hours.  Urinalysis    Component Value Date/Time   COLORURINE YELLOW 07/22/2014 1442   APPEARANCEUR CLEAR 07/22/2014 1442   LABSPEC >=1.030* 07/22/2014 1442   PHURINE 6.0 07/22/2014 1442   GLUCOSEU NEGATIVE 07/22/2014 1442   HGBUR TRACE-LYSED* 07/22/2014 1442   BILIRUBINUR NEGATIVE 07/22/2014 1442   BILIRUBINUR neg. 05/31/2012 1053   KETONESUR TRACE* 07/22/2014 1442   PROTEINUR 30+ 05/31/2012 1053   UROBILINOGEN 1.0 07/22/2014 1442   UROBILINOGEN 0.2 05/31/2012 1053   NITRITE NEGATIVE 07/22/2014 1442   NITRITE neg. 05/31/2012 1053   LEUKOCYTESUR NEGATIVE 07/22/2014 1442    STUDIES: No results found.  ASSESSMENT: 45 y.o. Flasher man with a history of Hodgkin's disease, stage IIB, diagnosed by CT-guided mediastinal biopsy October 2000, and treated with ABVD 6, all treatment completed May 2001.  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 I also don't see evidence of leukemia or other long-term sequela from his chemotherapy. Basically he appears to have had an upper respiratory infection for which he has recovered from.  I don't have a simple explanation why his neutrophils and hemoglobin are slightly down. They were not down a month ago. I'm going to repeat them a month from now and expect him to be  back to baseline.  Note that in African Americans a white cell count of 3.5 is normal. However the neutrophil count is low as noted  Of course Sul[place] has very microcytic red cells. This is going to be due to thalassemia, most likely beta. This requires no intervention except for the fact that it may suggest iron deficiency anemia erroneously. 2 diagnosed iron deficiency and Benny and patients like him would require formal iron studies  I have not made a return appointment for him here, but of course I will be glad to see him at any point in the future if and when the need arises.  The patient has a good understanding of the overall plan. She agrees with it. She knows the goal of treatment in her case is cure. She will call with any problems that may develop before her next visit here.  Chauncey Cruel, MD   03/11/2015 11:16 AM Medical Oncology and Hematology [facility] [address] [place], [place] [address] Tel. [phone]    Fax. [phone]

## 2015-03-11 NOTE — Telephone Encounter (Signed)
Gave patient avs report and appointment for February. Per 1/27 pof lab last week in February - no f/u given at this time and no other orders per 1/27 pof.

## 2015-04-11 ENCOUNTER — Other Ambulatory Visit: Payer: BLUE CROSS/BLUE SHIELD

## 2015-07-20 ENCOUNTER — Telehealth: Payer: Self-pay | Admitting: Internal Medicine

## 2015-07-20 NOTE — Telephone Encounter (Signed)
Agree with him being seen-no change until tomorrow after evaluation with Carleene Overlie

## 2015-07-20 NOTE — Telephone Encounter (Signed)
Can you take a look at this. Dr Jenny Reichmann is not back until next Tuesday

## 2015-07-20 NOTE — Telephone Encounter (Signed)
Patient Name: William Sheppard  Gender: Male  DOB: 22-Jul-1970   Age: 45 Y 27 M 20 D  Return Phone Number: [phone] (Primary), [phone] (Secondary)  Address:   City/State/Zip: [place]    Client [facility] - Client  Client Site [facility] - Day  Physician Cathlean Cower - MD  Contact Type Call  Who Is Calling Patient / Member / Family / Caregiver  Call Type Triage / Clinical  Relationship To Patient Self  Return Phone Number [phone] (Primary)  Chief Complaint Fatigue (>THREE MONTHS)  Reason for Call Symptomatic / Request for [place] states is type 2 diabetic. Blood glucose was 94 and wants to know if that would make you feel light headed, fatigue, and have increased appetite. Now levels are back to 136 but still tired. Took 1000 mg Metformin   Appointment Disposition EMR Appointment Scheduled  Info pasted into Epic Yes  Translation No   Nurse Assessment  Nurse: Mallie Mussel, RN, Alveta Heimlich Date/Time (Eastern Time): 07/20/2015 1:59:10 PM  Confirm and document reason for call. If symptomatic, describe symptoms. You must click the next button to save text entered. ---Caller states that his glucose was 94 this morning. He is now up to 136 a couple hours ago. He is taking 1000mg  Metformin BID He does not feel weak now, but he was a little earlier. Denies light headed and fatigue at present.  Has the patient traveled out of the country within the last 30 days? ---Not Applicable  Does the patient have any new or worsening symptoms? ---No     Guidelines      Guideline Title Affirmed Question Affirmed Notes Nurse Date/Time (Eastern Time)         Disp. Time Eilene Ghazi Time) Disposition Final User                Comments  User: Reeves Forth, RN Date/Time Eilene Ghazi Time): 07/20/2015 2:12:37 PM  Dr. Jenny Reichmann is on vacation. I was able to schedule him to see Dr. Mauricio Po tomorrow at 3:45pm. He was wanting to lower his dosage of the  Metformin. He takes Metformin 1000mg  BID. I suggested he see a doctor before lowering his dose on his own.

## 2015-07-21 ENCOUNTER — Ambulatory Visit (INDEPENDENT_AMBULATORY_CARE_PROVIDER_SITE_OTHER): Payer: BLUE CROSS/BLUE SHIELD | Admitting: Family

## 2015-07-21 ENCOUNTER — Encounter: Payer: Self-pay | Admitting: Family

## 2015-07-21 ENCOUNTER — Other Ambulatory Visit (INDEPENDENT_AMBULATORY_CARE_PROVIDER_SITE_OTHER): Payer: BLUE CROSS/BLUE SHIELD

## 2015-07-21 VITALS — BP 104/70 | HR 89 | Temp 98.1°F | Resp 16 | Ht 74.0 in | Wt 228.0 lb

## 2015-07-21 DIAGNOSIS — E119 Type 2 diabetes mellitus without complications: Secondary | ICD-10-CM | POA: Diagnosis not present

## 2015-07-21 DIAGNOSIS — R42 Dizziness and giddiness: Secondary | ICD-10-CM

## 2015-07-21 HISTORY — DX: Dizziness and giddiness: R42

## 2015-07-21 LAB — COMPREHENSIVE METABOLIC PANEL
ALBUMIN: 4.5 g/dL (ref 3.5–5.2)
ALT: 32 U/L (ref 0–53)
AST: 16 U/L (ref 0–37)
Alkaline Phosphatase: 51 U/L (ref 39–117)
BUN: 19 mg/dL (ref 6–23)
CALCIUM: 9.8 mg/dL (ref 8.4–10.5)
CHLORIDE: 105 meq/L (ref 96–112)
CO2: 30 meq/L (ref 19–32)
Creatinine, Ser: 1.11 mg/dL (ref 0.40–1.50)
GFR: 92.7 mL/min (ref >60.00)
Glucose, Bld: 111 mg/dL — ABNORMAL HIGH (ref 70–99)
POTASSIUM: 4 meq/L (ref 3.5–5.1)
Sodium: 141 mEq/L (ref 135–145)
Total Bilirubin: 0.2 mg/dL (ref 0.2–1.2)
Total Protein: 7.7 g/dL (ref 6.0–8.3)

## 2015-07-21 LAB — HEMOGLOBIN A1C: Hgb A1c MFr Bld: 7.7 % — ABNORMAL HIGH (ref 4.6–6.5)

## 2015-07-21 NOTE — Progress Notes (Signed)
Pre visit review using our clinic review tool, if applicable. No additional management support is needed unless otherwise documented below in the visit note. 

## 2015-07-21 NOTE — Progress Notes (Signed)
Subjective:    Patient ID: William Sheppard, male    DOB: 25-Jan-1971, 45 y.o.   MRN: [id number]  Chief Complaint  Patient presents with  . Weakness    noticed for the past couple of days that he was feeling weak and dizzy not feeling well and saw that his sugars have been lower for him at 94 and lower     HPI:  William Sheppard is a 45 y.o. male who  has a past medical history of Diabetes mellitus; Chronic headaches; Hyperlipidemia; and Hodgkin's lymphoma ([place]). and presents today for an acute office visit.   1.) Type 2 diabetes - Currently maintained on metformin. Reports taking the medication as prescribed and denies adverse side effects. Has noted over the past couple days he has been feeling weak and dizzy with his blood sugars being low for him and 94 lower. Denies symptoms of end organ damage. Reports the weakness comes and goes. Describes himself as active. The dizziness has occurred 2x over 3 days. States he drinks a lot of water and occasional caffeine. Lowest blood sugar was 89. Symptoms generally last about 5-10 minutes but generally goes away without treatment. Denies sickness or illness.    Lab Results  Component Value Date   HGBA1C 7.7* 07/21/2015    No Known Allergies   Current Outpatient Prescriptions on File Prior to Visit  Medication Sig Dispense Refill  . aspirin 81 MG EC tablet Take 1 tablet (81 mg total) by mouth daily. Swallow whole. 30 tablet 12  . glucose blood test strip 1 each by Other route daily. And lancets 1/day 250.00    . LANCETS ULTRA THIN MISC   0  . lisinopril (PRINIVIL,ZESTRIL) 5 MG tablet TAKE 1 TABLET EACH DAY. 30 tablet 11  . metFORMIN (GLUCOPHAGE) 1000 MG tablet Take 1 tablet (1,000 mg total) by mouth 2 (two) times daily with a meal. 180 tablet 3  . pravastatin (PRAVACHOL) 20 MG tablet TAKE 1 TABLET EACH DAY. 30 tablet 11   No current facility-administered medications on file prior to visit.     Past Surgical History  Procedure Laterality  Date  . Portacath placement  1999  . Wisdom tooth extraction       Review of Systems  Constitutional: Negative for fever and chills.  Respiratory: Negative for chest tightness and shortness of breath.   Cardiovascular: Negative for chest pain, palpitations and leg swelling.  Endocrine: Negative for polydipsia, polyphagia and polyuria.  Neurological: Positive for dizziness and weakness.      Objective:    BP 104/70 mmHg  Pulse 89  Temp(Src) 98.1 F (36.7 C) (Oral)  Resp 16  Ht 6\' 2"  (1.88 m)  Wt 228 lb (103.42 kg)  BMI 29.26 kg/m2  SpO2 97% Nursing note and vital signs reviewed.  Physical Exam  Constitutional: He is oriented to person, place, and time. He appears well-developed and well-nourished. No distress.  HENT:  Right Ear: Hearing, tympanic membrane, external ear and ear canal normal.  Left Ear: Hearing, tympanic membrane, external ear and ear canal normal.  Nose: Nose normal.  Mouth/Throat: Uvula is midline, oropharynx is clear and moist and mucous membranes are normal.  Eyes: Conjunctivae and EOM are normal. Pupils are equal, round, and reactive to light.  Cardiovascular: Normal rate, regular rhythm, normal heart sounds and intact distal pulses.   Pulmonary/Chest: Effort normal and breath sounds normal.  Neurological: He is alert and oriented to person, place, and time. No cranial nerve deficit.  Skin: Skin is warm and dry.  Psychiatric: He has a normal mood and affect. His behavior is normal. Judgment and thought content normal.       Assessment & Plan:   Problem List Items Addressed This Visit      Endocrine   Diabetes mellitus type II, non insulin dependent ([place]) - Primary    Type 2 diabetes with recent lower blood sugars located in the normal ranges and significant hypoglycemic readings below 70. Educated regarding signs and symptoms related to hypoglycemia. Obtain A1c and continue current dosage of metformin pending blood work results.      Relevant  Orders   Hemoglobin A1c (Completed)   Comprehensive metabolic panel (Completed)     Other   Dizziness    Transient dizziness with no specific identifiable cause. Recommend continued fluid intake and adequate nutrition. Exam is otherwise benign. Obtain CMET to check electrolytes and kidney function to rule out imbalance. Follow up pending blood work.           I am having Mr. Inglett maintain his aspirin, glucose blood, LANCETS ULTRA THIN, pravastatin, lisinopril, and metFORMIN.   Follow-up: Pending blood work results.   Mauricio Po, FNP

## 2015-07-21 NOTE — Assessment & Plan Note (Signed)
Transient dizziness with no specific identifiable cause. Recommend continued fluid intake and adequate nutrition. Exam is otherwise benign. Obtain CMET to check electrolytes and kidney function to rule out imbalance. Follow up pending blood work.

## 2015-07-21 NOTE — Patient Instructions (Addendum)
Thank you for choosing [facility].  Summary/Instructions:   Please stop by the lab on the basement level of the building for your blood work. Your results will be released to [place] (or called to you) after review, usually within 72 hours after test completion. If any changes need to be made, you will be notified at that same time.  If your symptoms worsen or fail to improve, please contact our office for further instruction, or in case of emergency go directly to the emergency room at the closest medical facility.   Please continue to take your medications as prescribed.   Sugar free candy as needed for dry mouth.

## 2015-07-21 NOTE — Assessment & Plan Note (Signed)
Type 2 diabetes with recent lower blood sugars located in the normal ranges and significant hypoglycemic readings below 70. Educated regarding signs and symptoms related to hypoglycemia. Obtain A1c and continue current dosage of metformin pending blood work results.

## 2015-07-24 ENCOUNTER — Encounter: Payer: Self-pay | Admitting: Internal Medicine

## 2015-07-24 ENCOUNTER — Encounter: Payer: Self-pay | Admitting: Family

## 2015-07-25 MED ORDER — GLIPIZIDE ER 2.5 MG PO TB24: 2.5000 mg | ORAL_TABLET | Freq: Every day | ORAL | Status: DC

## 2015-07-25 NOTE — Telephone Encounter (Signed)
William Sheppard to let pt know of med change as detailed above in the Mychart message please

## 2015-09-21 ENCOUNTER — Encounter: Payer: Self-pay | Admitting: Internal Medicine

## 2015-09-22 ENCOUNTER — Telehealth: Payer: Self-pay | Admitting: Internal Medicine

## 2015-09-22 NOTE — Telephone Encounter (Signed)
Patient has scheduled visit with  Marya Amsler tomorrow morning for foot pain.  Is requesting Corrine to call him back.

## 2015-09-23 ENCOUNTER — Encounter: Payer: Self-pay | Admitting: Family

## 2015-09-23 ENCOUNTER — Ambulatory Visit (INDEPENDENT_AMBULATORY_CARE_PROVIDER_SITE_OTHER): Payer: BLUE CROSS/BLUE SHIELD | Admitting: Family

## 2015-09-23 DIAGNOSIS — M79672 Pain in left foot: Secondary | ICD-10-CM | POA: Diagnosis not present

## 2015-09-23 DIAGNOSIS — M79671 Pain in right foot: Secondary | ICD-10-CM

## 2015-09-23 MED ORDER — DICLOFENAC SODIUM 2 % TD SOLN: 1.0000 "application " | Freq: Two times a day (BID) | TRANSDERMAL | 1 refills | Status: DC | PRN

## 2015-09-23 MED ORDER — NAPROXEN-ESOMEPRAZOLE 500-20 MG PO TBEC: 1.0000 | DELAYED_RELEASE_TABLET | Freq: Two times a day (BID) | ORAL | 0 refills | Status: DC | PRN

## 2015-09-23 NOTE — Assessment & Plan Note (Signed)
Symptoms and exam consistent with inflammation/tendinitis of the peroneal tendons. Treat conservatively with ice, home exercise therapy, Vimovo, and Pennsaid. Encouraged to wear a good supportive shoe as he is flat footed. If symptoms do not improve consider reviewing under ultrasound and possible cortisone injections.

## 2015-09-23 NOTE — Patient Instructions (Signed)
Thank you for choosing [facility].  Summary/Instructions:  Ice x 20 minutes every 2 hours as needed and after activity  Pennsaid 1/2 pack to the affected area 2x per day.  Vimovo 2x per day for the next 5 days.   Exercises daily.   Your prescription(s) have been submitted to your pharmacy or been printed and provided for you. Please take as directed and contact our office if you believe you are having problem(s) with the medication(s) or have any questions.  If your symptoms worsen or fail to improve, please contact our office for further instruction, or in case of emergency go directly to the emergency room at the closest medical facility.    Peroneal Tendinitis With Rehab Tendonitis is inflammation of a tendon. Inflammation of the tendons on the back of the outer ankle (peroneal tendons) is known as peroneal tendonitis. The peroneal tendons are responsible for connecting the muscles that allow you to stand on your tiptoes to the bones of the ankle. For this reason, peroneal tendonitis often causes pain when trying to complete such motions. Peroneal tendonitis often involves a tear (strain) of the peroneal tendons. Strains are classified into three categories. Grade 1 strains cause pain, but the tendon is not lengthened. Grade 2 strains include a lengthened ligament, due to the ligament being stretched or partially ruptured. With grade 2 strains there is still function, although function may be decreased. Grade 3 strains involve a complete tear of the tendon or muscle, and function is usually impaired. SYMPTOMS   Pain, tenderness, swelling, warmth, or redness over the back of the outer side of the ankle, the outer part of the mid-foot, or the bottom of the arch.  Pain that gets worse with ankle motion (especially when pushing off or pushing down with the front of the foot), or when standing on the ball of the foot or pushing the foot outward.  Crackling sound (crepitation) when the  tendon is moved or touched. CAUSES  Peroneal tendinitis occurs when injury to the peroneal tendons causes the body to respond with inflammation. Common causes of injury include:  An overuse injury, in which the groove behind the outer ankle (where the tendon is located) causes wear on the tendon.  A sudden stress placed on the tendon, such as from an increase in the intensity, frequency, or duration of training.  Direct hit (trauma) to the tendon.  Return to activity too soon after a previous ankle injury. RISK INCREASES WITH:  Sports that require sudden, repetitive pushing off of the foot, such as jumping or quick starts.  Kicking and running sports, especially running down hills or long distances.  Poor strength and flexibility.  Previous injury to the foot, ankle, or leg. PREVENTION  Warm up and stretch properly before activity.  Allow for adequate recovery between workouts.  Maintain physical fitness:  Strength, flexibility, and endurance.  Cardiovascular fitness.  Complete rehabilitation after previous injury. PROGNOSIS  If treated properly, peroneal tendonitis usually heals within 6 weeks.  RELATED COMPLICATIONS  Longer healing time, if not properly treated or if not given enough time to heal.  Recurring symptoms if activity is resumed too soon, with overuse, or when using poor technique.  If untreated, tendinitis may result in tendon rupture, requiring surgery. TREATMENT  Treatment first involves the use of ice and medicine to reduce pain and inflammation. The use of strengthening and stretching exercises may help reduce pain with activity. These exercises may be performed at unsuccessful, surgery to remove the inflamed  tendon lining (sheath) may be advised.  MEDICATION   If pain medicine is needed, nonsteroidal anti-inflammatory medicines (aspirin and ibuprofen), or other minor pain relievers (acetaminophen), are often advised.  Do not take pain medicine for 7  days before surgery.  Prescription pain relievers may be given, if your caregiver thinks they are needed. Use only as directed and only as much as you need. HEAT AND COLD  Cold treatment (icing) should be applied for 10 to 15 minutes every 2 to 3 hours for inflammation and pain, and immediately after activity that aggravates your symptoms. Use ice packs or an ice massage.  Heat treatment may be used before performing stretching and strengthening activities prescribed by your caregiver, physical therapist, or athletic trainer. Use a heat pack or a warm water soak. SEEK MEDICAL CARE IF:  Symptoms get worse or do not improve in 2 to 4 weeks, despite treatment.  New, unexplained symptoms develop. (Drugs used in treatment may produce side effects.) EXERCISES RANGE OF MOTION (ROM) AND STRETCHING EXERCISES - Peroneal Tendinitis These exercises may help you when beginning to rehabilitate your injury. Your symptoms may resolve with or without further involvement from your physician, physical therapist or athletic trainer. While completing these exercises, remember:   Restoring tissue flexibility helps normal motion to return to the joints. This allows healthier, less painful movement and activity.  An effective stretch should be held for at least 30 seconds.  A stretch should never be painful. You should only feel a gentle lengthening or release in the stretched tissue. RANGE OF MOTION - Ankle Eversion  Sit with your right / left ankle crossed over your opposite knee.  Grip your foot with your opposite hand, placing your thumb on the top of your foot and your fingers across the bottom of your foot.  Gently push your foot downward with a slight rotation, so your littlest toes rise slightly toward the ceiling.  You should feel a gentle stretch on the inside of your ankle. Hold the stretch for __________ seconds. Repeat __________ times. Complete this exercise __________ times per day.  RANGE OF  MOTION - Ankle Inversion  Sit with your right / left ankle crossed over your opposite knee.  Grip your foot with your opposite hand, placing your thumb on the bottom of your foot and your fingers across the top of your foot.  Gently pull your foot so the smallest toe comes toward you and your thumb pushes the inside of the ball of your foot away from you.  You should feel a gentle stretch on the outside of your ankle. Hold the stretch for __________ seconds. Repeat __________ times. Complete this exercise __________ times per day.  RANGE OF MOTION - Ankle Plantar Flexion  Sit with your right / left leg crossed over your opposite knee.  Use your opposite hand to pull the top of your foot and toes toward you.  You should feel a gentle stretch on the top of your foot and ankle. Hold this position for __________ seconds. Repeat __________ times. Complete __________ times per day.  STRETCH - Gastroc, Standing  Place your hands on a wall.  Extend your right / left leg behind you, keeping the front knee somewhat bent.  Slightly point your toes inward on your back foot.  Keeping your right / left heel on the floor and your knee straight, shift your weight toward the wall, not allowing your back to arch.  You should feel a gentle stretch in the calf.  Hold this position for __________ seconds. Repeat __________ times. Complete this stretch __________ times per day. STRETCH - Soleus, Standing  Place your hands on a wall.  Extend your right / left leg behind you, keeping the other knee somewhat bent.  Slightly point your toes inward on your back foot.  Keep your heel on the floor, bend your back knee, and slightly shift your weight over the back leg so that you feel a gentle stretch deep in your back calf.  Hold this position for __________ seconds. Repeat __________ times. Complete this stretch __________ times per day. STRETCH - Gastrocsoleus, Standing Note: This exercise can place a  lot of stress on your foot and ankle. Please complete this exercise only if specifically instructed by your caregiver.   Place the ball of your right / left foot on a step, keeping your other foot firmly on the same step.  Hold on to the wall or a rail for balance.  Slowly lift your other foot, allowing your body weight to press your heel down over the edge of the step.  You should feel a stretch in your right / left calf.  Hold this position for __________ seconds.  Repeat this exercise with a slight bend in your knee. Repeat __________ times. Complete this stretch __________ times per day.  STRENGTHENING EXERCISES - Peroneal Tendinitis  These exercises may help you when beginning to rehabilitate your injury. They may resolve your symptoms with or without further involvement from your physician, physical therapist or athletic trainer. While completing these exercises, remember:   Muscles can gain both the endurance and the strength needed for everyday activities through controlled exercises.  Complete these exercises as instructed by your physician, physical therapist or athletic trainer. Increase the resistance and repetitions only as guided by your caregiver. STRENGTH - Dorsiflexors  Secure a rubber exercise band or tubing to a fixed object (table, pole) and loop the other end around your right / left foot.  Sit on the floor facing the fixed object. The band should be slightly tense when your foot is relaxed.  Slowly draw your foot back toward you, using your ankle and toes.  Hold this position for __________ seconds. Slowly release the tension in the band and return your foot to the starting position. Repeat __________ times. Complete this exercise __________ times per day.  STRENGTH - Towel Curls  Sit in a chair, on a non-carpeted surface.  Place your foot on a towel, keeping your heel on the floor.  Pull the towel toward your heel only by curling your toes. Keep your heel on  the floor.  If instructed by your physician, physical therapist or athletic trainer, add weight to the end of the towel. Repeat __________ times. Complete this exercise __________ times per day. STRENGTH - Ankle Eversion   Secure one end of a rubber exercise band or tubing to a fixed object (table, pole). Loop the other end around your foot, just before your toes.  Place your fists between your knees. This will focus your strengthening at your ankle.  Drawing the band across your opposite foot, away from the pole, slowly, pull your little toe out and up. Make sure the band is positioned to resist the entire motion.  Hold this position for __________ seconds.  Have your muscles resist the band, as it slowly pulls your foot back to the starting position. Repeat __________ times. Complete this exercise __________ times per day.    This information is not intended  to replace advice given to you by your health care provider. Make sure you discuss any questions you have with your health care provider.   Document Released: 01/29/2005 Document Revised: 06/15/2014 Document Reviewed: 05/13/2008 Elsevier Interactive Patient Education [employer].

## 2015-09-23 NOTE — Progress Notes (Signed)
Subjective:    Patient ID: William Sheppard, male    DOB: [birth year], 45 y.o.   MRN: [id number]  Chief Complaint  Patient presents with  . Foot Pain    x1 month both feet have been bothering him, described as a constant pain    HPI:  William Sheppard is a 45 y.o. male who  has a past medical history of Chronic headaches; Diabetes mellitus; Hodgkin's lymphoma ([place]); and Hyperlipidemia. and presents today for an office visit.   This is a new problem. Associated symptom of pain located in his bilateral feet has been going on for about 1 month and describes as constant and described as achy. He was helping a friend move and following that day the pain and edema started. Modifying factors include Epson salt soaks and elevation. Denies any specific injury or trauma that he can recall without any sounds/sensations. Pain is generally worsened after sitting for long periods of time or timing of symptoms is worse in the morning.   No Known Allergies   Current Outpatient Prescriptions on File Prior to Visit  Medication Sig Dispense Refill  . aspirin 81 MG EC tablet Take 1 tablet (81 mg total) by mouth daily. Swallow whole. 30 tablet 12  . glipiZIDE (GLIPIZIDE XL) 2.5 MG 24 hr tablet Take 1 tablet (2.5 mg total) by mouth daily with breakfast. 90 tablet 3  . glucose blood test strip 1 each by Other route daily. And lancets 1/day 250.00    . LANCETS ULTRA THIN MISC   0  . lisinopril (PRINIVIL,ZESTRIL) 5 MG tablet TAKE 1 TABLET EACH DAY. 30 tablet 11  . metFORMIN (GLUCOPHAGE) 1000 MG tablet Take 1 tablet (1,000 mg total) by mouth 2 (two) times daily with a meal. 180 tablet 3  . pravastatin (PRAVACHOL) 20 MG tablet TAKE 1 TABLET EACH DAY. 30 tablet 11   No current facility-administered medications on file prior to visit.     Past Medical History:  Diagnosis Date  . Chronic headaches   . Diabetes mellitus   . Hodgkin's lymphoma ([place])   . Hyperlipidemia     Past Surgical History:  Procedure  Laterality Date  . [place]  . WISDOM TOOTH EXTRACTION      Review of Systems  Constitutional: Negative for chills and fever.  Musculoskeletal:       Positive for bilateral foot pain.  Neurological: Negative for weakness and numbness.      Objective:    BP 120/82 (BP Location: Left Arm, Patient Position: Sitting, Cuff Size: Large)   Pulse 77   Temp 97.9 F (36.6 C) (Oral)   Resp 16   Ht 6\' 2"  (1.88 m)   Wt 224 lb (101.6 kg)   SpO2 98%   BMI 28.76 kg/m  Nursing note and vital signs reviewed.  Physical Exam  Constitutional: He is oriented to person, place, and time. He appears well-developed and well-nourished. No distress.  Cardiovascular: Normal rate, regular rhythm, normal heart sounds and intact distal pulses.   Pulmonary/Chest: Effort normal and breath sounds normal.  Musculoskeletal:  Bilateral feet - no obvious deformity, discoloration, or edema. Palpable tenderness noted along the base of the fifth metatarsal. Range of motion is within normal limits with discomfort noted in full inversion and eversion. Passive range of motion is without pain. Pulses and capillary refill are intact and appropriate. He does have pes planus when walking.  Neurological: He is alert and oriented to person, place, and time.  Skin: Skin is warm and dry.  Psychiatric: He has a normal mood and affect. His behavior is normal. Judgment and thought content normal.       Assessment & Plan:   Problem List Items Addressed This Visit      Other   Bilateral foot pain    Symptoms and exam consistent with inflammation/tendinitis of the peroneal tendons. Treat conservatively with ice, home exercise therapy, Vimovo, and Pennsaid. Encouraged to wear a good supportive shoe as he is flat footed. If symptoms do not improve consider reviewing under ultrasound and possible cortisone injections.      Relevant Medications   Diclofenac Sodium (PENNSAID) 2 % SOLN   Naproxen-Esomeprazole  500-20 MG TBEC    Other Visit Diagnoses   None.      I am having Mr. Hartzel start on Diclofenac Sodium and Naproxen-Esomeprazole. I am also having him maintain his aspirin, glucose blood, LANCETS ULTRA THIN, pravastatin, lisinopril, metFORMIN, and glipiZIDE.   Meds ordered this encounter  Medications  . Diclofenac Sodium (PENNSAID) 2 % SOLN    Sig: Place 1 application onto the skin 2 (two) times daily as needed.    Dispense:  112 g    Refill:  1    Order Specific Question:   Supervising Provider    Answer:   Pricilla Holm A [id number]  . Naproxen-Esomeprazole 500-20 MG TBEC    Sig: Take 1 tablet by mouth 2 (two) times daily as needed.    Dispense:  60 tablet    Refill:  0    Order Specific Question:   Supervising Provider    Answer:   Pricilla Holm A [id number]     Follow-up: Return in about 3 weeks (around 10/14/2015), or if symptoms worsen or fail to improve.   Mauricio Po, FNP

## 2015-10-10 ENCOUNTER — Encounter: Payer: Self-pay | Admitting: Internal Medicine

## 2015-10-11 MED ORDER — GLIPIZIDE ER 5 MG PO TB24: 5.0000 mg | ORAL_TABLET | Freq: Every day | ORAL | 3 refills | Status: DC

## 2015-10-11 MED ORDER — METFORMIN HCL 500 MG PO TABS: 500.0000 mg | ORAL_TABLET | Freq: Two times a day (BID) | ORAL | 3 refills | Status: DC

## 2015-11-24 ENCOUNTER — Encounter: Payer: Self-pay | Admitting: Internal Medicine

## 2015-12-09 ENCOUNTER — Encounter: Payer: Self-pay | Admitting: Internal Medicine

## 2015-12-20 ENCOUNTER — Other Ambulatory Visit (INDEPENDENT_AMBULATORY_CARE_PROVIDER_SITE_OTHER): Payer: BLUE CROSS/BLUE SHIELD

## 2015-12-20 ENCOUNTER — Ambulatory Visit (INDEPENDENT_AMBULATORY_CARE_PROVIDER_SITE_OTHER): Payer: BLUE CROSS/BLUE SHIELD | Admitting: Internal Medicine

## 2015-12-20 ENCOUNTER — Encounter: Payer: Self-pay | Admitting: Internal Medicine

## 2015-12-20 VITALS — BP 140/80 | HR 95 | Temp 98.5°F | Resp 20 | Wt 230.0 lb

## 2015-12-20 DIAGNOSIS — R202 Paresthesia of skin: Secondary | ICD-10-CM | POA: Diagnosis not present

## 2015-12-20 DIAGNOSIS — R0789 Other chest pain: Secondary | ICD-10-CM

## 2015-12-20 DIAGNOSIS — Z0001 Encounter for general adult medical examination with abnormal findings: Secondary | ICD-10-CM

## 2015-12-20 DIAGNOSIS — E119 Type 2 diabetes mellitus without complications: Secondary | ICD-10-CM | POA: Diagnosis not present

## 2015-12-20 DIAGNOSIS — R200 Anesthesia of skin: Secondary | ICD-10-CM

## 2015-12-20 DIAGNOSIS — E785 Hyperlipidemia, unspecified: Secondary | ICD-10-CM | POA: Diagnosis not present

## 2015-12-20 LAB — MICROALBUMIN / CREATININE URINE RATIO
CREATININE, U: 148.4 mg/dL
Microalb Creat Ratio: 3.6 mg/g (ref 0.0–30.0)
Microalb, Ur: 5.3 mg/dL — ABNORMAL HIGH (ref 0.0–1.9)

## 2015-12-20 LAB — LIPID PANEL
CHOL/HDL RATIO: 3
Cholesterol: 149 mg/dL (ref 0–200)
HDL: 47.4 mg/dL (ref >39.00)
LDL CALC: 69 mg/dL (ref 0–99)
NONHDL: 101.75
TRIGLYCERIDES: 164 mg/dL — AB (ref 0.0–149.0)
VLDL: 32.8 mg/dL (ref 0.0–40.0)

## 2015-12-20 LAB — CBC WITH DIFFERENTIAL/PLATELET
BASOS PCT: 0.4 % (ref 0.0–3.0)
Basophils Absolute: 0 10*3/uL (ref 0.0–0.1)
EOS ABS: 0.1 10*3/uL (ref 0.0–0.7)
EOS PCT: 1.3 % (ref 0.0–5.0)
HEMATOCRIT: 41.5 % (ref 39.0–52.0)
HEMOGLOBIN: 13.2 g/dL (ref 13.0–17.0)
LYMPHS PCT: 47.4 % — AB (ref 12.0–46.0)
Lymphs Abs: 2.1 10*3/uL (ref 0.7–4.0)
MCHC: 31.8 g/dL (ref 30.0–36.0)
MONOS PCT: 9.5 % (ref 3.0–12.0)
Monocytes Absolute: 0.4 10*3/uL (ref 0.1–1.0)
Neutro Abs: 1.8 10*3/uL (ref 1.4–7.7)
Neutrophils Relative %: 41.4 % — ABNORMAL LOW (ref 43.0–77.0)
Platelets: 214 10*3/uL (ref 150.0–400.0)
RBC: 6.05 Mil/uL — ABNORMAL HIGH (ref 4.22–5.81)
RDW: 15.3 % (ref 11.5–15.5)
WBC: 4.4 10*3/uL (ref 4.0–10.5)

## 2015-12-20 LAB — HEMOGLOBIN A1C: Hgb A1c MFr Bld: 6.7 % — ABNORMAL HIGH (ref 4.6–6.5)

## 2015-12-20 LAB — HEPATIC FUNCTION PANEL
ALT: 42 U/L (ref 0–53)
AST: 17 U/L (ref 0–37)
Albumin: 4.6 g/dL (ref 3.5–5.2)
Alkaline Phosphatase: 44 U/L (ref 39–117)
BILIRUBIN DIRECT: 0.1 mg/dL (ref 0.0–0.3)
BILIRUBIN TOTAL: 0.3 mg/dL (ref 0.2–1.2)
Total Protein: 7.6 g/dL (ref 6.0–8.3)

## 2015-12-20 LAB — URINALYSIS, ROUTINE W REFLEX MICROSCOPIC
Bilirubin Urine: NEGATIVE
Ketones, ur: NEGATIVE
Leukocytes, UA: NEGATIVE
NITRITE: NEGATIVE
Specific Gravity, Urine: 1.02 (ref 1.000–1.030)
Total Protein, Urine: NEGATIVE
URINE GLUCOSE: NEGATIVE
Urobilinogen, UA: 0.2 (ref 0.0–1.0)
WBC UA: NONE SEEN (ref 0–?)
pH: 6.5 (ref 5.0–8.0)

## 2015-12-20 LAB — BASIC METABOLIC PANEL
BUN: 21 mg/dL (ref 6–23)
CALCIUM: 10.1 mg/dL (ref 8.4–10.5)
CO2: 28 mEq/L (ref 19–32)
CREATININE: 1.03 mg/dL (ref 0.40–1.50)
Chloride: 106 mEq/L (ref 96–112)
GFR: 100.87 mL/min (ref >60.00)
Glucose, Bld: 115 mg/dL — ABNORMAL HIGH (ref 70–99)
Potassium: 3.8 mEq/L (ref 3.5–5.1)
Sodium: 141 mEq/L (ref 135–145)

## 2015-12-20 LAB — PSA: PSA: 0.43 ng/mL (ref 0.10–4.00)

## 2015-12-20 LAB — TSH: TSH: 1.3 u[IU]/mL (ref 0.35–4.50)

## 2015-12-20 LAB — VITAMIN B12: Vitamin B-12: 396 pg/mL (ref 211–911)

## 2015-12-20 NOTE — Patient Instructions (Signed)
Your EKG was OK today  Please continue all other medications as before, and refills have been done if requested.  Please have the pharmacy call with any other refills you may need.  Please continue your efforts at being more active, low cholesterol diet, and weight control.  You are otherwise up to date with prevention measures today.  Please keep your appointments with your specialists as you may have planned  You will be contacted regarding the referral for: Stress test  Please go to the LAB in the Basement (turn left off the elevator) for the tests to be done today  You will be contacted by phone if any changes need to be made immediately.  Otherwise, you will receive a letter about your results with an explanation, but please check with MyChart first.  Please remember to sign up for MyChart if you have not done so, as this will be important to you in the future with finding out test results, communicating by private email, and scheduling acute appointments online when needed.  Please return in 6 months, or sooner if needed, with Lab testing done 3-5 days before

## 2015-12-20 NOTE — Progress Notes (Signed)
Subjective:    Patient ID: William Sheppard, male    DOB: 16-May-1970, 45 y.o.   MRN: [id number]  HPI   Here for wellness and f/u;  Overall doing ok;  Pt denies Chest pain, worsening SOB, DOE, wheezing, orthopnea, PND, worsening LE edema, palpitations, dizziness or syncope.  Pt denies neurological change such as new headache, facial or extremity weakness. Pt states overall good compliance with treatment and medications, good tolerability, and has been trying to follow appropriate diet.  Pt denies worsening depressive symptoms, suicidal ideation or panic. No fever, night sweats, wt loss, loss of appetite, or other constitutional symptoms.  Pt states good ability with ADL's, has low fall risk, home safety reviewed and adequate, no other significant changes in hearing or vision, and only occasionally active with exercise. No other significant changes to hx.  Wt Readings from Last 3 Encounters:  12/20/15 230 lb (104.3 kg)  09/23/15 224 lb (101.6 kg)  07/21/15 228 lb (103.4 kg)  Declines flu shot  Foot pain improved but not completely, does still c/o left leg paresthesias.      Pt denies polydipsia, polyuria, or low sugar symptoms such as weakness or confusion improved with po intake.  Pt states overall good compliance with meds, trying to follow lower cholesterol, diabetic diet, wt overall several lbs incresaed,  but exercise however is less but still fairlyu active with lfiting wts..  Coronary score 0 with dec 2014 CT. 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 Chronic headaches   . Diabetes mellitus   . Hodgkin's lymphoma ([place])   . Hyperlipidemia    Past Surgical History:  Procedure Laterality Date  . [place]  . WISDOM TOOTH EXTRACTION      reports that he has never smoked. He does not have any smokeless tobacco  history on file. He reports that he drinks alcohol. He reports that he does not use drugs. family history is not on file. No Known Allergies Current Outpatient Prescriptions on File Prior to Visit  Medication Sig Dispense Refill  . aspirin 81 MG EC tablet Take 1 tablet (81 mg total) by mouth daily. Swallow whole. 30 tablet 12  . Diclofenac Sodium (PENNSAID) 2 % SOLN Place 1 application onto the skin 2 (two) times daily as needed. 112 g 1  . glipiZIDE (GLIPIZIDE XL) 5 MG 24 hr tablet Take 1 tablet (5 mg total) by mouth daily with breakfast. 90 tablet 3  . glucose blood test strip 1 each by Other route daily. And lancets 1/day 250.00    . LANCETS ULTRA THIN MISC   0  . metFORMIN (GLUCOPHAGE) 500 MG tablet Take 1 tablet (500 mg total) by mouth 2 (two) times daily with a meal. 180 tablet 3  . Naproxen-Esomeprazole 500-20 MG TBEC Take 1 tablet by mouth 2 (two) times daily as needed. 60 tablet 0   No current facility-administered medications on file prior to visit.    Review of Systems Constitutional: Negative for increased diaphoresis, or other activity, appetite or siginficant weight change other than noted HENT: Negative for worsening hearing loss, ear pain, facial swelling, mouth sores and neck stiffness.   Eyes: Negative for other worsening pain, redness or visual disturbance.  Respiratory: Negative for choking or stridor Cardiovascular: Negative for other chest pain and palpitations.  Gastrointestinal: Negative for  worsening diarrhea, blood in stool, or abdominal distention Genitourinary: Negative for hematuria, flank pain or change in urine volume.  Musculoskeletal: Negative for myalgias or other joint complaints.  Skin: Negative for other color change and wound or drainage.  Neurological: Negative for syncope and numbness. other than noted Hematological: Negative for adenopathy. or other swelling Psychiatric/Behavioral: Negative for hallucinations, SI, self-injury, decreased  concentration or other worsening agitation.  All other system neg per pt    Objective:   Physical Exam BP 140/80   Pulse 95   Temp 98.5 F (36.9 C) (Oral)   Resp 20   Wt 230 lb (104.3 kg)   SpO2 99%   BMI 29.53 kg/m  VS noted,  Constitutional: Pt is oriented to person, place, and time. Appears well-developed and well-nourished, in no significant distress Head: Normocephalic and atraumatic  Eyes: Conjunctivae and EOM are normal. Pupils are equal, round, and reactive to light Right Ear: External ear normal.  Left Ear: External ear normal Nose: Nose normal.  Mouth/Throat: Oropharynx is clear and moist  Neck: Normal range of motion. Neck supple. No JVD present. No tracheal deviation present or significant neck LA or mass Cardiovascular: Normal rate, regular rhythm, normal heart sounds and intact distal pulses.   Pulmonary/Chest: Effort normal and breath sounds without rales or wheezing  Abdominal: Soft. Bowel sounds are normal. NT. No HSM  Musculoskeletal: Normal range of motion. Exhibits no edema Lymphadenopathy: Has no cervical adenopathy.  Neurological: Pt is alert and oriented to person, place, and time. Pt has normal reflexes. No cranial nerve deficit. Motor grossly intact Skin: Skin is warm and dry. No rash noted or new ulcers Psychiatric:  Has normal mood and affect. Behavior is normal.   ecg today I have personally interpreted Sinus  Rhythm  -  Diffuse nonspecific T-abnormality.      Assessment & Plan:

## 2015-12-20 NOTE — Progress Notes (Signed)
Pre visit review using our clinic review tool, if applicable. No additional management support is needed unless otherwise documented below in the visit note. 

## 2015-12-26 ENCOUNTER — Other Ambulatory Visit: Payer: Self-pay | Admitting: Internal Medicine

## 2015-12-26 NOTE — Assessment & Plan Note (Signed)
Also for b12

## 2015-12-26 NOTE — Assessment & Plan Note (Signed)
stable overall by history and exam, recent data reviewed with pt, and pt to continue medical treatment as before,  to f/u any worsening symptoms or concerns Lab Results  Component Value Date   HGBA1C 6.7 (H) 12/20/2015

## 2015-12-26 NOTE — Assessment & Plan Note (Signed)

## 2015-12-26 NOTE — Assessment & Plan Note (Signed)
stable overall by history and exam, recent data reviewed with pt, and pt to continue medical treatment as before,  to f/u any worsening symptoms or concerns Lab Results  Component Value Date   LDLCALC 69 12/20/2015

## 2015-12-26 NOTE — Assessment & Plan Note (Addendum)
Etiology unclear, ecg reviewed, atypical, for stress test referral  In addition to the time spent performing CPE, I spent an additional 15 minutes face to face,in which greater than 50% of this time was spent in counseling and coordination of care for patient's illness as documented.

## 2015-12-29 ENCOUNTER — Telehealth: Payer: Self-pay

## 2015-12-29 MED ORDER — GLUCOSE BLOOD VI STRP: ORAL_STRIP | 12 refills | Status: DC

## 2015-12-29 NOTE — Telephone Encounter (Signed)
Corrine to send meter that matches strips, o/w ok for onetouch ultra 2 device

## 2015-12-29 NOTE — Addendum Note (Signed)
Addended by: Biagio Borg on: 12/29/2015 08:21 AM   Modules accepted: Orders

## 2015-12-29 NOTE — Telephone Encounter (Signed)
Refills sent

## 2016-01-10 ENCOUNTER — Ambulatory Visit (INDEPENDENT_AMBULATORY_CARE_PROVIDER_SITE_OTHER): Payer: BLUE CROSS/BLUE SHIELD | Admitting: Internal Medicine

## 2016-01-10 ENCOUNTER — Encounter: Payer: Self-pay | Admitting: Internal Medicine

## 2016-01-10 VITALS — BP 128/74 | HR 94 | Temp 98.4°F | Resp 20 | Wt 243.0 lb

## 2016-01-10 DIAGNOSIS — E119 Type 2 diabetes mellitus without complications: Secondary | ICD-10-CM | POA: Diagnosis not present

## 2016-01-10 DIAGNOSIS — J019 Acute sinusitis, unspecified: Secondary | ICD-10-CM | POA: Diagnosis not present

## 2016-01-10 MED ORDER — PROMETHAZINE-CODEINE 6.25-10 MG/5ML PO SYRP
5.0000 mL | ORAL_SOLUTION | Freq: Four times a day (QID) | ORAL | 0 refills | Status: AC | PRN
Start: 2016-01-10 — End: 2016-01-20

## 2016-01-10 MED ORDER — LEVOFLOXACIN 500 MG PO TABS: 500.0000 mg | ORAL_TABLET | Freq: Every day | ORAL | 0 refills | Status: AC

## 2016-01-10 MED ORDER — LEVOFLOXACIN 500 MG PO TABS: 500.0000 mg | ORAL_TABLET | Freq: Every day | ORAL | 0 refills | Status: DC

## 2016-01-10 NOTE — Progress Notes (Signed)
Pre visit review using our clinic review tool, if applicable. No additional management support is needed unless otherwise documented below in the visit note. 

## 2016-01-10 NOTE — Patient Instructions (Signed)
Please take all new medication as prescribed - the antibiotic, and cough medicine  Please continue all other medications as before, and refills have been done if requested.  Please have the pharmacy call with any other refills you may need.  Please keep your appointments with your specialists as you may have planned      

## 2016-01-10 NOTE — Progress Notes (Signed)
   Subjective:    Patient ID: William Sheppard, male    DOB: 1970/09/29, 45 y.o.   MRN: [id number]  HPI   Here with 2-3 days acute onset fever, facial pain, pressure, headache, general weakness and malaise, and greenish d/c, with mild ST and scant prod cough, but pt denies chest pain, wheezing, increased sob or doe, orthopnea, PND, increased LE swelling, palpitations, dizziness or syncope.  Pt denies polydipsia, polyuria,  Past Medical History:  Diagnosis Date  . Chronic headaches   . Diabetes mellitus   . Hodgkin's lymphoma ([place])   . Hyperlipidemia    Past Surgical History:  Procedure Laterality Date  . [place]  . WISDOM TOOTH EXTRACTION      reports that he has never smoked. He does not have any smokeless tobacco history on file. He reports that he drinks alcohol. He reports that he does not use drugs. family history is not on file. No Known Allergies Current Outpatient Prescriptions on File Prior to Visit  Medication Sig Dispense Refill  . aspirin 81 MG EC tablet Take 1 tablet (81 mg total) by mouth daily. Swallow whole. 30 tablet 12  . Diclofenac Sodium (PENNSAID) 2 % SOLN Place 1 application onto the skin 2 (two) times daily as needed. 112 g 1  . glipiZIDE (GLIPIZIDE XL) 5 MG 24 hr tablet Take 1 tablet (5 mg total) by mouth daily with breakfast. 90 tablet 3  . glucose blood test strip And lancets 1/day 250.00 100 each 12  . LANCETS ULTRA THIN MISC   0  . lisinopril (PRINIVIL,ZESTRIL) 5 MG tablet TAKE 1 TABLET EACH DAY. 30 tablet 5  . metFORMIN (GLUCOPHAGE) 500 MG tablet Take 1 tablet (500 mg total) by mouth 2 (two) times daily with a meal. 180 tablet 3  . Naproxen-Esomeprazole 500-20 MG TBEC Take 1 tablet by mouth 2 (two) times daily as needed. 60 tablet 0  . pravastatin (PRAVACHOL) 20 MG tablet TAKE 1 TABLET EACH DAY. 30 tablet 5   No current facility-administered medications on file prior to visit.    Review of Systems All otherwise neg per pt       Objective:   Physical Exam BP 128/74   Pulse 94   Temp 98.4 F (36.9 C) (Oral)   Resp 20   Wt 243 lb (110.2 kg)   SpO2 98%   BMI 31.20 kg/m  VS noted,  Constitutional: Pt appears in no apparent distress HENT: Head: NCAT.  Right Ear: External ear normal.  Left Ear: External ear normal.  Eyes: . Pupils are equal, round, and reactive to light. Conjunctivae and EOM are normal Bilat tm's with mild erythema.  Max sinus areas mild tender.  Pharynx with mild erythema, no exudate Neck: Normal range of motion. Neck supple.  Cardiovascular: Normal rate and regular rhythm.   Pulmonary/Chest: Effort normal and breath sounds without rales or wheezing.  Neurological: Pt is alert. Not confused , motor grossly intact Skin: Skin is warm. No rash, no LE edema Psychiatric: Pt behavior is normal. No agitation.     Assessment & Plan:

## 2016-01-16 NOTE — Assessment & Plan Note (Signed)
stable overall by history and exam, recent data reviewed with pt, and pt to continue medical treatment as before,  to f/u any worsening symptoms or concerns Lab Results  Component Value Date   HGBA1C 6.7 (H) 12/20/2015

## 2016-01-16 NOTE — Assessment & Plan Note (Signed)
Mild to mod, for antibx course,  to f/u any worsening symptoms or concerns 

## 2016-01-23 ENCOUNTER — Ambulatory Visit (HOSPITAL_COMMUNITY): Payer: BLUE CROSS/BLUE SHIELD | Attending: Internal Medicine

## 2016-01-23 DIAGNOSIS — Z8571 Personal history of Hodgkin lymphoma: Secondary | ICD-10-CM | POA: Insufficient documentation

## 2016-01-23 DIAGNOSIS — I10 Essential (primary) hypertension: Secondary | ICD-10-CM | POA: Insufficient documentation

## 2016-01-23 DIAGNOSIS — R079 Chest pain, unspecified: Secondary | ICD-10-CM | POA: Diagnosis not present

## 2016-01-23 DIAGNOSIS — R0789 Other chest pain: Secondary | ICD-10-CM

## 2016-01-23 DIAGNOSIS — E119 Type 2 diabetes mellitus without complications: Secondary | ICD-10-CM | POA: Insufficient documentation

## 2016-01-23 DIAGNOSIS — R9439 Abnormal result of other cardiovascular function study: Secondary | ICD-10-CM | POA: Diagnosis not present

## 2016-01-23 LAB — MYOCARDIAL PERFUSION IMAGING
CHL CUP MPHR: 176 {beats}/min
CSEPHR: 86 %
Estimated workload: 10.1 METS
Exercise duration (min): 9 min
Exercise duration (sec): 0 s
LHR: 0.33
LV sys vol: 74 mL
LVDIAVOL: 140 mL (ref 62–150)
Peak HR: 153 {beats}/min
RPE: 18
Rest HR: 76 {beats}/min
SDS: 0
SRS: 4
SSS: 4
TID: 1.01

## 2016-01-23 MED ORDER — TECHNETIUM TC 99M TETROFOSMIN IV KIT
10.5000 | PACK | Freq: Once | INTRAVENOUS | Status: AC | PRN
  Administered 2016-01-23: 10.5 via INTRAVENOUS
  Filled 2016-01-23: qty 11

## 2016-01-23 MED ORDER — TECHNETIUM TC 99M TETROFOSMIN IV KIT
32.5000 | PACK | Freq: Once | INTRAVENOUS | Status: AC | PRN
  Administered 2016-01-23: 32.5 via INTRAVENOUS
  Filled 2016-01-23: qty 33

## 2016-02-28 ENCOUNTER — Encounter: Payer: Self-pay | Admitting: Internal Medicine

## 2016-02-28 ENCOUNTER — Ambulatory Visit (INDEPENDENT_AMBULATORY_CARE_PROVIDER_SITE_OTHER): Payer: BLUE CROSS/BLUE SHIELD | Admitting: Internal Medicine

## 2016-02-28 DIAGNOSIS — M25552 Pain in left hip: Secondary | ICD-10-CM

## 2016-02-28 HISTORY — DX: Pain in left hip: M25.552

## 2016-02-28 MED ORDER — NAPROXEN 500 MG PO TABS: 500.0000 mg | ORAL_TABLET | Freq: Two times a day (BID) | ORAL | 2 refills | Status: DC

## 2016-02-28 MED ORDER — CYCLOBENZAPRINE HCL 5 MG PO TABS: 5.0000 mg | ORAL_TABLET | Freq: Three times a day (TID) | ORAL | 1 refills | Status: DC | PRN

## 2016-02-28 NOTE — Assessment & Plan Note (Signed)
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

## 2016-02-28 NOTE — Patient Instructions (Signed)
Please take all new medication as prescribed - the anti-inflammatory, and muscle relaxer if needed  Please continue all other medications as before, and refills have been done if requested.  Please have the pharmacy call with any other refills you may need.  You will be contacted regarding the referral for: Dr Smith/sports medicine in this office, though you can cancel the appt if you get better  Please keep your appointments with your specialists as you may have planned

## 2016-02-28 NOTE — Progress Notes (Signed)
Pre visit review using our clinic review tool, if applicable. No additional management support is needed unless otherwise documented below in the visit note. 

## 2016-02-28 NOTE — Progress Notes (Signed)
Subjective:    Patient ID: William Sheppard, male    DOB: Oct 13, 1970, 46 y.o.   MRN: [id number]  HPI  Here to f/u with c/o 4 days onset significant left lateral hip pain, mild to mod, intermittent without radiation, but assoc with limping, and finding flexion/extension movements of the leg do not make worse, but internal and external rotation movement during daily activities can make worse.  No LBP, fever, and Denies urinary symptoms such as dysuria, frequency, urgency, flank pain, hematuria or n/v, fever, chills.  Denies worsening reflux, abd pain, dysphagia, n/v, bowel change or blood.No rash, swelling or trauma. Pain is worse to lie directly on left side as well.  No other new history Past Medical History:  Diagnosis Date  . Chronic headaches   . Diabetes mellitus   . Hodgkin's lymphoma ([place])   . Hyperlipidemia    Past Surgical History:  Procedure Laterality Date  . [place]  . WISDOM TOOTH EXTRACTION      reports that he has never smoked. He does not have any smokeless tobacco history on file. He reports that he drinks alcohol. He reports that he does not use drugs. family history is not on file. No Known Allergies Current Outpatient Prescriptions on File Prior to Visit  Medication Sig Dispense Refill  . aspirin 81 MG EC tablet Take 1 tablet (81 mg total) by mouth daily. Swallow whole. 30 tablet 12  . Diclofenac Sodium (PENNSAID) 2 % SOLN Place 1 application onto the skin 2 (two) times daily as needed. 112 g 1  . glipiZIDE (GLIPIZIDE XL) 5 MG 24 hr tablet Take 1 tablet (5 mg total) by mouth daily with breakfast. 90 tablet 3  . glucose blood test strip And lancets 1/day 250.00 100 each 12  . LANCETS ULTRA THIN MISC   0  . lisinopril (PRINIVIL,ZESTRIL) 5 MG tablet TAKE 1 TABLET EACH DAY. 30 tablet 5  . metFORMIN (GLUCOPHAGE) 500 MG tablet Take 1 tablet (500 mg total) by mouth 2 (two) times daily with a meal. 180 tablet 3  . Naproxen-Esomeprazole 500-20 MG TBEC Take 1  tablet by mouth 2 (two) times daily as needed. 60 tablet 0  . pravastatin (PRAVACHOL) 20 MG tablet TAKE 1 TABLET EACH DAY. 30 tablet 5   No current facility-administered medications on file prior to visit.    Review of Systems    All other system neg per pt    Objective:   Physical Exam BP 124/76   Pulse (!) 101   Temp 98.3 F (36.8 C) (Oral)   Resp 20   Wt 228 lb (103.4 kg)   SpO2 96%   BMI 29.27 kg/m  VS noted,  Constitutional: Pt appears in no apparent distress HENT: Head: NCAT.  Right Ear: External ear normal.  Left Ear: External ear normal.  Eyes: . Pupils are equal, round, and reactive to light. Conjunctivae and EOM are normal Neck: Normal range of motion. Neck supple.  Cardiovascular: Normal rate and regular rhythm.   Pulmonary/Chest: Effort normal and breath sounds without rales or wheezing.  Abd:  Soft, NT, ND, + BS, no flank pain Left lateral hip with tender over greater trochanter, left hip with from to flexion /extension, reduced ROM to int and ext rotation Neurological: Pt is alert. Not confused , motor 5/5 intact Skin: Skin is warm. No rash, no LE edema Psychiatric: Pt behavior is normal. No agitation.  No other new exam findings    Assessment & Plan:

## 2016-03-08 ENCOUNTER — Telehealth: Payer: Self-pay | Admitting: Internal Medicine

## 2016-03-08 NOTE — Telephone Encounter (Signed)
Ok for corinne to contact pt who needs DM supplies - strips and lancets  But will need to send the correct supplies for his glucometer he already has

## 2016-03-08 NOTE — Telephone Encounter (Signed)
Called patient left message stating that he must become a member of that company before they will send him prescriptions. He needs to call and set up an account to do so.

## 2016-03-08 NOTE — Telephone Encounter (Signed)
Pt called requesting diabetic testing supply send to [facility] phone [phone]. Please send it there.

## 2016-04-12 ENCOUNTER — Encounter: Payer: Self-pay | Admitting: Internal Medicine

## 2016-04-12 MED ORDER — AZITHROMYCIN 250 MG PO TABS: ORAL_TABLET | ORAL | 1 refills | Status: DC

## 2016-04-13 ENCOUNTER — Telehealth: Payer: Self-pay | Admitting: Internal Medicine

## 2016-04-13 ENCOUNTER — Telehealth: Payer: Self-pay | Admitting: Family Medicine

## 2016-04-13 MED ORDER — AZITHROMYCIN 250 MG PO TABS: ORAL_TABLET | ORAL | 0 refills | Status: DC

## 2016-04-13 NOTE — Telephone Encounter (Signed)
Received a call from [facility]. Patient was supposed to have an antibiotic prescription sent to his pharmacy for sinusitis. It appears that this was printed and faxed per review of the notes. It appears that they may not have gotten faxed prescription. I will send this to his pharmacy. FYI to PCP.

## 2016-04-13 NOTE — Telephone Encounter (Signed)
Called pt to let him know Send and faxed Rx azithromycin to [facility]

## 2016-07-16 ENCOUNTER — Other Ambulatory Visit: Payer: Self-pay | Admitting: Internal Medicine

## 2016-10-12 ENCOUNTER — Telehealth: Payer: Self-pay | Admitting: Internal Medicine

## 2016-10-12 DIAGNOSIS — Z0000 Encounter for general adult medical examination without abnormal findings: Secondary | ICD-10-CM

## 2016-10-12 NOTE — Telephone Encounter (Signed)
Patient would like labs to be entered before CPE at the end of September.  Please call once entered.

## 2016-10-12 NOTE — Telephone Encounter (Signed)
Notified patient.

## 2016-10-12 NOTE — Telephone Encounter (Signed)
Done

## 2016-10-16 ENCOUNTER — Other Ambulatory Visit (INDEPENDENT_AMBULATORY_CARE_PROVIDER_SITE_OTHER): Payer: BLUE CROSS/BLUE SHIELD

## 2016-10-16 DIAGNOSIS — Z0000 Encounter for general adult medical examination without abnormal findings: Secondary | ICD-10-CM | POA: Diagnosis not present

## 2016-10-16 LAB — HEPATIC FUNCTION PANEL
ALK PHOS: 48 U/L (ref 39–117)
ALT: 49 U/L (ref 0–53)
AST: 21 U/L (ref 0–37)
Albumin: 4.7 g/dL (ref 3.5–5.2)
BILIRUBIN DIRECT: 0.1 mg/dL (ref 0.0–0.3)
TOTAL PROTEIN: 7.7 g/dL (ref 6.0–8.3)
Total Bilirubin: 0.3 mg/dL (ref 0.2–1.2)

## 2016-10-16 LAB — URINALYSIS, ROUTINE W REFLEX MICROSCOPIC
Bilirubin Urine: NEGATIVE
Ketones, ur: NEGATIVE
LEUKOCYTES UA: NEGATIVE
NITRITE: NEGATIVE
Specific Gravity, Urine: 1.025 (ref 1.000–1.030)
Urine Glucose: NEGATIVE
Urobilinogen, UA: 0.2 (ref 0.0–1.0)
pH: 6 (ref 5.0–8.0)

## 2016-10-16 LAB — CBC WITH DIFFERENTIAL/PLATELET
BASOS ABS: 0 10*3/uL (ref 0.0–0.1)
Basophils Relative: 0.5 % (ref 0.0–3.0)
EOS ABS: 0.1 10*3/uL (ref 0.0–0.7)
Eosinophils Relative: 1.1 % (ref 0.0–5.0)
HEMATOCRIT: 42.2 % (ref 39.0–52.0)
HEMOGLOBIN: 13.3 g/dL (ref 13.0–17.0)
LYMPHS PCT: 55.8 % — AB (ref 12.0–46.0)
Lymphs Abs: 2.7 10*3/uL (ref 0.7–4.0)
MCHC: 31.6 g/dL (ref 30.0–36.0)
MCV: 70.1 fl — ABNORMAL LOW (ref 78.0–100.0)
MONOS PCT: 8.9 % (ref 3.0–12.0)
Monocytes Absolute: 0.4 10*3/uL (ref 0.1–1.0)
Neutro Abs: 1.6 10*3/uL (ref 1.4–7.7)
Neutrophils Relative %: 33.7 % — ABNORMAL LOW (ref 43.0–77.0)
Platelets: 223 10*3/uL (ref 150.0–400.0)
RBC: 6.02 Mil/uL — AB (ref 4.22–5.81)
RDW: 15 % (ref 11.5–15.5)
WBC: 4.8 10*3/uL (ref 4.0–10.5)

## 2016-10-16 LAB — BASIC METABOLIC PANEL
BUN: 20 mg/dL (ref 6–23)
CALCIUM: 9.8 mg/dL (ref 8.4–10.5)
CO2: 27 meq/L (ref 19–32)
Chloride: 107 mEq/L (ref 96–112)
Creatinine, Ser: 1.12 mg/dL (ref 0.40–1.50)
GFR: 91.23 mL/min (ref >60.00)
Glucose, Bld: 92 mg/dL (ref 70–99)
Potassium: 4.4 mEq/L (ref 3.5–5.1)
SODIUM: 141 meq/L (ref 135–145)

## 2016-10-16 LAB — LIPID PANEL
CHOL/HDL RATIO: 4
Cholesterol: 149 mg/dL (ref 0–200)
HDL: 41.7 mg/dL (ref >39.00)
LDL Cholesterol: 92 mg/dL (ref 0–99)
NONHDL: 107.44
Triglycerides: 77 mg/dL (ref 0.0–149.0)
VLDL: 15.4 mg/dL (ref 0.0–40.0)

## 2016-10-16 LAB — PSA: PSA: 0.52 ng/mL (ref 0.10–4.00)

## 2016-10-16 LAB — TSH: TSH: 1.65 u[IU]/mL (ref 0.35–4.50)

## 2016-10-22 ENCOUNTER — Encounter: Payer: Self-pay | Admitting: Internal Medicine

## 2016-11-03 ENCOUNTER — Other Ambulatory Visit: Payer: Self-pay | Admitting: Internal Medicine

## 2016-11-08 ENCOUNTER — Encounter: Payer: Self-pay | Admitting: Internal Medicine

## 2016-11-08 ENCOUNTER — Ambulatory Visit (INDEPENDENT_AMBULATORY_CARE_PROVIDER_SITE_OTHER): Payer: BLUE CROSS/BLUE SHIELD | Admitting: Internal Medicine

## 2016-11-08 VITALS — BP 140/86 | HR 87 | Temp 98.4°F | Ht 74.0 in | Wt 226.0 lb

## 2016-11-08 DIAGNOSIS — Z0001 Encounter for general adult medical examination with abnormal findings: Secondary | ICD-10-CM

## 2016-11-08 DIAGNOSIS — E785 Hyperlipidemia, unspecified: Secondary | ICD-10-CM | POA: Diagnosis not present

## 2016-11-08 DIAGNOSIS — R002 Palpitations: Secondary | ICD-10-CM | POA: Insufficient documentation

## 2016-11-08 DIAGNOSIS — E119 Type 2 diabetes mellitus without complications: Secondary | ICD-10-CM

## 2016-11-08 DIAGNOSIS — C8113 Nodular sclerosis classical Hodgkin lymphoma, intra-abdominal lymph nodes: Secondary | ICD-10-CM | POA: Diagnosis not present

## 2016-11-08 DIAGNOSIS — Z114 Encounter for screening for human immunodeficiency virus [HIV]: Secondary | ICD-10-CM

## 2016-11-08 DIAGNOSIS — M542 Cervicalgia: Secondary | ICD-10-CM | POA: Insufficient documentation

## 2016-11-08 HISTORY — DX: Cervicalgia: M54.2

## 2016-11-08 LAB — POCT GLYCOSYLATED HEMOGLOBIN (HGB A1C): Hemoglobin A1C: 6.6

## 2016-11-08 MED ORDER — PRAVASTATIN SODIUM 40 MG PO TABS: 40.0000 mg | ORAL_TABLET | Freq: Every day | ORAL | 3 refills | Status: DC

## 2016-11-08 MED ORDER — METFORMIN HCL 500 MG PO TABS: 500.0000 mg | ORAL_TABLET | Freq: Two times a day (BID) | ORAL | 3 refills | Status: DC

## 2016-11-08 MED ORDER — GLIPIZIDE ER 5 MG PO TB24: 5.0000 mg | ORAL_TABLET | Freq: Every day | ORAL | 3 refills | Status: DC

## 2016-11-08 NOTE — Patient Instructions (Signed)
Your A1c was OK today  Ok to increase the pravachol to 40 mg per day  You will be contacted regarding the referral for: Dr Jana Hakim, Cardiology, and the Echocardiogram  Please continue all other medications as before, including the Aspirin 81 mg per day  Please have the pharmacy call with any other refills you may need.  Please continue your efforts at being more active, low cholesterol diet, and weight control.  You are otherwise up to date with prevention measures today.  Please keep your appointments with your specialists as you may have planned  Please return in 6 months, or sooner if needed, with Lab testing done 3-5 days before

## 2016-11-08 NOTE — Assessment & Plan Note (Signed)

## 2016-11-08 NOTE — Assessment & Plan Note (Addendum)
High suspicion for possible recurrent SVT; declines ECG today as needs to quickly get back to his employment, but would like ecg with cardiology referral, in addition to Echo.  May need cardiac monitor and/or EP evaluation, Note TSH normal  In addition to the time spent performing CPE, I spent an additional 25 minutes face to face,in which greater than 50% of this time was spent in counseling and coordination of care for patient's acute illness as documented, including the differential dx, tx, further evaluation and other management of rapid palpitations, acute left neck lymphadenitis, DM, HLD and hx of hodgkins lymphoma

## 2016-11-08 NOTE — Assessment & Plan Note (Signed)
Pt reqeusts f/u with oncology - will refer

## 2016-11-08 NOTE — Assessment & Plan Note (Signed)
Mild uncontrolled, goal ldl < 70, for increased pravastatin to 40 qd

## 2016-11-08 NOTE — Assessment & Plan Note (Signed)
Lab Results  Component Value Date   HGBA1C 6.6 11/08/2016  stable overall by history and exam, recent data reviewed with pt, and pt to continue medical treatment as before,  to f/u any worsening symptoms or concerns

## 2016-11-08 NOTE — Assessment & Plan Note (Signed)
With evidence for probable acute lymphadenitis by hx improving pain now, afeb and swelling some better; no obvious source infection as hx and exam o/w benign, will hold antibx but pt plans to mention at his f/u with Dr Magrinat/oncology

## 2016-11-08 NOTE — Progress Notes (Signed)
Subjective:    Patient ID: William Sheppard, male    DOB: 1970-03-25, 46 y.o.   MRN: [id number]  HPI  Here for wellness and f/u;  Overall doing ok;[name]  Pt denies neurological change such as new headache, facial or extremity weakness.  Pt denies polydipsia, polyuria, or low sugar symptoms. Pt states overall good compliance with treatment and medications, good tolerability, and has been trying to follow appropriate diet.  Pt denies worsening depressive symptoms, suicidal ideation or panic. No fever, night sweats, wt loss, loss of appetite, or other constitutional symptoms.  Pt states good ability with ADL's, has low fall risk, home safety reviewed and adequate, no other significant changes in hearing or vision, and occasionally active with exercise.  Declines immunizations.   Has hx of hodgkins lymphoma, and recently with sudden onset 1 wk left neck pain/tender swelling without trauma, fever, ST, cough or overlying skin change.   Pt denies chest pain, increased sob or doe, wheezing, orthopnea, PND, increased LE swelling, dizziness or syncope, but in the past weeks has had recurring episodes of sudden rapid palpitations that are relatively easily resolved with coughing. Past Medical History:  Diagnosis Date  . Chronic headaches   . Diabetes mellitus   . Hodgkin's lymphoma ([place])   . Hyperlipidemia    Past Surgical History:  Procedure Laterality Date  . [place]  . WISDOM TOOTH EXTRACTION      reports that he has never smoked. He has never used smokeless tobacco. He reports that he drinks alcohol. He reports that he does not use drugs. family history is not on file. No Known Allergies Current Outpatient Prescriptions on File Prior to Visit  Medication Sig Dispense Refill  . aspirin 81 MG EC tablet Take 1 tablet (81 mg total) by mouth daily. Swallow whole. 30 tablet 12  . cyclobenzaprine (FLEXERIL) 5 MG tablet Take 1 tablet (5 mg total) by mouth 3 (three) times daily as needed for  muscle spasms. 60 tablet 1  . Diclofenac Sodium (PENNSAID) 2 % SOLN Place 1 application onto the skin 2 (two) times daily as needed. 112 g 1  . glucose blood test strip And lancets 1/day 250.00 100 each 12  . LANCETS ULTRA THIN MISC   0  . lisinopril (PRINIVIL,ZESTRIL) 5 MG tablet TAKE 1 TABLET EACH DAY. 30 tablet 5  . naproxen (NAPROSYN) 500 MG tablet Take 1 tablet (500 mg total) by mouth 2 (two) times daily with a meal. 60 tablet 2  . Naproxen-Esomeprazole 500-20 MG TBEC Take 1 tablet by mouth 2 (two) times daily as needed. 60 tablet 0   No current facility-administered medications on file prior to visit.    Review of Systems Constitutional: Negative for other unusual diaphoresis, sweats, appetite or weight changes HENT: Negative for other worsening hearing loss, ear pain, facial swelling, mouth sores or neck stiffness.   Eyes: Negative for other worsening pain, redness or other visual disturbance.  Respiratory: Negative for other stridor or swelling Cardiovascular: Negative for other palpitations or other chest pain  Gastrointestinal: Negative for worsening diarrhea or loose stools, blood in stool, distention or other pain Genitourinary: Negative for hematuria, flank pain or other change in urine volume.  Musculoskeletal: Negative for myalgias or other joint swelling.  Skin: Negative for other color change, or other wound or worsening drainage.  Neurological: Negative for other syncope or numbness. Hematological: Negative for other adenopathy or swelling Psychiatric/Behavioral: Negative for hallucinations, other worsening agitation, SI, self-injury, or new decreased  concentration All other system neg per pt    Objective:   Physical Exam BP 140/86   Pulse 87   Temp 98.4 F (36.9 C) (Oral)   Ht 6\' 2"  (1.88 m)   Wt 226 lb (102.5 kg)   SpO2 99%   BMI 29.02 kg/m  VS noted,  Constitutional: Pt is oriented to person, place, and time. Appears well-developed and well-nourished, in no  significant distress and comfortable Head: Normocephalic and atraumatic  Eyes: Conjunctivae and EOM are normal. Pupils are equal, round, and reactive to light Right Ear: External ear normal without discharge Left Ear: External ear normal without discharge Nose: Nose without discharge or deformity Mouth/Throat: Oropharynx is without other ulcerations and moist  Neck: Normal range of motion. Neck supple. No JVD present. No tracheal deviation present or significant neck LA or mass Cardiovascular: Normal rate, regular rhythm, normal heart sounds and intact distal pulses.   Pulmonary/Chest: WOB normal and breath sounds without rales or wheezing  Abdominal: Soft. Bowel sounds are normal. NT. No HSM  Musculoskeletal: Normal range of motion. Exhibits no edema Lymphadenopathy: Has mild swelling tender soft mobile left cervical adenopathy at the caudal half of the left SCM possibly anterior and posterior chains, but none > 1 cm it seems by my estimate Neurological: Pt is alert and oriented to person, place, and time. Pt has normal reflexes. No cranial nerve deficit. Motor grossly intact, Gait intact Skin: Skin is warm and dry. No rash noted or new ulcerations Psychiatric:  Has normal mood and affect. Behavior is normal without agitation No other exam findings  POCT glycosylated hemoglobin (Hb A1C)  - today Status:  Final result Visible to patient:  No (Not Released) Dx:  Diabetes mellitus type II, non insuli...  Component 16:17  Hemoglobin A1C 6.6        Lab Results  Component Value Date   WBC 4.8 10/16/2016   HGB 13.3 10/16/2016   HCT 42.2 10/16/2016   PLT 223.0 10/16/2016   GLUCOSE 92 10/16/2016   CHOL 149 10/16/2016   TRIG 77.0 10/16/2016   HDL 41.70 10/16/2016   LDLDIRECT 83.0 07/22/2014   LDLCALC 92 10/16/2016   ALT 49 10/16/2016   AST 21 10/16/2016   NA 141 10/16/2016   K 4.4 10/16/2016   CL 107 10/16/2016   CREATININE 1.12 10/16/2016   BUN 20 10/16/2016   CO2 27 10/16/2016     TSH 1.65 10/16/2016   PSA 0.52 10/16/2016   HGBA1C 6.6 11/08/2016   MICROALBUR 5.3 (H) 12/20/2015        Assessment & Plan:

## 2016-11-13 ENCOUNTER — Encounter: Payer: Self-pay | Admitting: Internal Medicine

## 2016-11-26 ENCOUNTER — Other Ambulatory Visit: Payer: Self-pay | Admitting: Oncology

## 2016-11-26 DIAGNOSIS — C8194 Hodgkin lymphoma, unspecified, lymph nodes of axilla and upper limb: Secondary | ICD-10-CM

## 2016-11-26 NOTE — Progress Notes (Unsigned)
William Sheppard  [phone] [phone]     ID: William Sheppard DOB: 07/09/1970  MR#: [id number]  POE#:[id number]  Patient Care Team: Biagio Borg, MD as PCP - General (Internal Medicine) PCP: Biagio Borg, MD OTHER MD:  CHIEF COMPLAINT: History of Hodgkin's lymphoma, recent upper respiratory infection  CURRENT TREATMENT: Observation   CANCER HISTORY: William Sheppard has a history of Hodgkin's disease, which was diagnosed by CT-guided mediastinal biopsy October 2000. It was stage IIB. He was treated with standard chemotherapy, ABVD, 6, all treatments completed in May 2001. There has not been any evidence of recurrence  INTERVAL HISTORY: William Sheppard presents back to the hematology clinic because of concerns following a recent upper respiratory infection. He tells me he developed a cough, with significant sinus drainage sometime in the middle of December. He was treated with antibiotics and prednisone but refused the prednisone because of concerns regarding his diabetes and he says his antibiotics also increased his glucose. So we just "tough it out" he says. By the first week in January he was better and currently he denies any cough, phlegm production, sinus drainage, pleurisy, or shortness of breath.  REVIEW OF SYSTEMS: William Sheppard has a diagnosis of diabetes mellitus. He tells me his A1c is close to 7. We do have some pretty high readings from a couple of months ago, almost 500. He says things are now better controlled. He is tolerating the metformin without diarrhea. He also has high blood pressure, which is controlled on lisinopril. Aside from these issues a detailed review of systems today was noncontributory  PAST MEDICAL HISTORY: Past Medical History:  Diagnosis Date  . Chronic headaches   . Diabetes mellitus   . Hodgkin's lymphoma ([place])   . Hyperlipidemia     PAST SURGICAL HISTORY: Past Surgical History:  Procedure Laterality Date  . [place]    . WISDOM TOOTH EXTRACTION      FAMILY HISTORY No family history on file. The patient's parents are still living, his father being 73 and his mother 70 as of January 2017. The patient had no brothers. One sister. There is no history of cancer in the family.  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 His wife William Sheppard) works as a Dance movement psychotherapist. Son William Sheppard attends [employer], age 75; daughter  William Sheppard, 2, is still at home.   ADVANCED DIRECTIVES:  not in place    HEALTH MAINTENANCE: Social History  Substance Use Topics  . Smoking status: Never Smoker  . Smokeless tobacco: Never Used  . Alcohol use Yes     Comment: social     Colonoscopy:  PAP:  Bone density:  Lipid panel:  No Known Allergies  Current Outpatient Prescriptions  Medication Sig Dispense Refill  . aspirin 81 MG EC tablet Take 1 tablet (81 mg total) by mouth daily. Swallow whole. 30 tablet 12  . cyclobenzaprine (FLEXERIL) 5 MG tablet Take 1 tablet (5 mg total) by mouth 3 (three) times daily as needed for muscle spasms. 60 tablet 1  . Diclofenac Sodium (PENNSAID) 2 % SOLN Place 1 application onto the skin 2 (two) times daily as needed. 112 g 1  . glipiZIDE (GLIPIZIDE XL) 5 MG 24 hr tablet Take 1 tablet (5 mg total) by mouth daily with breakfast. 90 tablet 3  . glucose blood test strip And lancets 1/day 250.00 100 each 12  . LANCETS ULTRA THIN MISC   0  . lisinopril (PRINIVIL,ZESTRIL)  5 MG tablet TAKE 1 TABLET EACH DAY. 30 tablet 5  . metFORMIN (GLUCOPHAGE) 500 MG tablet Take 1 tablet (500 mg total) by mouth 2 (two) times daily with a meal. 180 tablet 3  . naproxen (NAPROSYN) 500 MG tablet Take 1 tablet (500 mg total) by mouth 2 (two) times daily with a meal. 60 tablet 2  . Naproxen-Esomeprazole 500-20 MG TBEC Take 1 tablet by mouth 2 (two) times daily as needed. 60 tablet 0  . pravastatin (PRAVACHOL) 40 MG tablet Take 1 tablet (40 mg total) by mouth daily. 90 tablet 3   No current  facility-administered medications for this visit.     OBJECTIVE: middle-aged white male who appears younger than stated age There were no vitals filed for this visit.   There is no height or weight on file to calculate BMI.    ECOG FS:0 - Asymptomatic  Ocular: Sclerae unicteric, pupils equal, round and reactive to light Ear-nose-throat: Oropharynx clear and moist Lymphatic: No cervical or supraclavicular adenopathy; no axillary or inguinal adenopathy Lungs no rales or rhonchi, good excursion bilaterally Heart regular rate and rhythm, no murmur appreciated Abd soft, nontender, positive bowel sounds MSK no focal spinal tenderness, no joint edema Neuro: non-focal, well-oriented, appropriate affect    LAB RESULTS:  CMP     Component Value Date/Time   NA 141 10/16/2016 1016   NA 141 03/09/2015 1128   K 4.4 10/16/2016 1016   K 4.2 03/09/2015 1128   CL 107 10/16/2016 1016   CO2 27 10/16/2016 1016   CO2 25 03/09/2015 1128   GLUCOSE 92 10/16/2016 1016   GLUCOSE 180 (H) 03/09/2015 1128   BUN 20 10/16/2016 1016   BUN 20.0 03/09/2015 1128   CREATININE 1.12 10/16/2016 1016   CREATININE 1.2 03/09/2015 1128   CALCIUM 9.8 10/16/2016 1016   CALCIUM 9.4 03/09/2015 1128   PROT 7.7 10/16/2016 1016   PROT 7.7 03/09/2015 1128   ALBUMIN 4.7 10/16/2016 1016   ALBUMIN 4.0 03/09/2015 1128   AST 21 10/16/2016 1016   AST 17 03/09/2015 1128   ALT 49 10/16/2016 1016   ALT 37 03/09/2015 1128   ALKPHOS 48 10/16/2016 1016   ALKPHOS 54 03/09/2015 1128   BILITOT 0.3 10/16/2016 1016   BILITOT 0.49 03/09/2015 1128   GFRNONAA >60 02/03/2015 2200   GFRAA >60 02/03/2015 2200    INo results found for: SPEP, UPEP  Lab Results  Component Value Date   WBC 4.8 10/16/2016   NEUTROABS 1.6 10/16/2016   HGB 13.3 10/16/2016   HCT 42.2 10/16/2016   MCV 70.1 (L) 10/16/2016   PLT 223.0 10/16/2016      Chemistry      Component Value Date/Time   NA 141 10/16/2016 1016   NA 141 03/09/2015 1128   K 4.4  10/16/2016 1016   K 4.2 03/09/2015 1128   CL 107 10/16/2016 1016   CO2 27 10/16/2016 1016   CO2 25 03/09/2015 1128   BUN 20 10/16/2016 1016   BUN 20.0 03/09/2015 1128   CREATININE 1.12 10/16/2016 1016   CREATININE 1.2 03/09/2015 1128      Component Value Date/Time   CALCIUM 9.8 10/16/2016 1016   CALCIUM 9.4 03/09/2015 1128   ALKPHOS 48 10/16/2016 1016   ALKPHOS 54 03/09/2015 1128   AST 21 10/16/2016 1016   AST 17 03/09/2015 1128   ALT 49 10/16/2016 1016   ALT 37 03/09/2015 1128   BILITOT 0.3 10/16/2016 1016   BILITOT 0.49 03/09/2015 1128  No results found for: LABCA2  No components found for: HERDE081  No results for input(s): INR in the last 168 hours.  Urinalysis    Component Value Date/Time   COLORURINE YELLOW 10/16/2016 1016   APPEARANCEUR CLEAR 10/16/2016 1016   LABSPEC 1.025 10/16/2016 1016   PHURINE 6.0 10/16/2016 1016   GLUCOSEU NEGATIVE 10/16/2016 1016   HGBUR TRACE-INTACT (A) 10/16/2016 1016   BILIRUBINUR NEGATIVE 10/16/2016 1016   BILIRUBINUR neg. 05/31/2012 1053   KETONESUR NEGATIVE 10/16/2016 1016   PROTEINUR 30+ 05/31/2012 1053   UROBILINOGEN 0.2 10/16/2016 1016   NITRITE NEGATIVE 10/16/2016 1016   LEUKOCYTESUR NEGATIVE 10/16/2016 1016    STUDIES: No results found.  ASSESSMENT: 46 y.o. [place] man with a history of Hodgkin's disease, stage IIB, diagnosed by CT-guided mediastinal biopsy October 2000, and treated with ABVD 6, all treatment completed May 2001.  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 I also don't see evidence of leukemia or other long-term sequela from his chemotherapy. Basically he appears to have had an upper respiratory infection for which he has recovered from.  I don't have a simple explanation why his neutrophils and hemoglobin are slightly down. They were not down a month ago.  I'm going to repeat them a month from now and expect him to be back to baseline.  Note that in African Americans a white cell count of 3.5 is normal. However the neutrophil count is low as noted  Of course William Sheppard has very microcytic red cells. This is going to be due to thalassemia, most likely beta. This requires no intervention except for the fact that it may suggest iron deficiency anemia erroneously. 2 diagnosed iron deficiency and William Sheppard and patients like him would require formal iron studies  I have not made a return appointment for him here, but of course I will be glad to see him at any point in the future if and when the need arises.  The patient has a good understanding of the overall plan. She agrees with it. She knows the goal of treatment in her case is cure. She will call with any problems that may develop before her next visit here.  William Cruel, MD   11/26/2016 7:15 AM Medical Oncology and Hematology [facility] [address] [place], [place] [address] Tel. [phone]    Fax. [phone]

## 2016-11-28 ENCOUNTER — Telehealth: Payer: Self-pay | Admitting: Oncology

## 2016-11-28 NOTE — Telephone Encounter (Signed)
Left message for patient regarding upcoming November appointments per 10/15 sch message.

## 2016-11-29 ENCOUNTER — Encounter: Payer: Self-pay | Admitting: Interventional Cardiology

## 2016-11-29 ENCOUNTER — Ambulatory Visit (INDEPENDENT_AMBULATORY_CARE_PROVIDER_SITE_OTHER): Payer: BLUE CROSS/BLUE SHIELD | Admitting: Interventional Cardiology

## 2016-11-29 VITALS — BP 124/76 | HR 89 | Ht 74.0 in | Wt 227.8 lb

## 2016-11-29 DIAGNOSIS — E782 Mixed hyperlipidemia: Secondary | ICD-10-CM

## 2016-11-29 DIAGNOSIS — R002 Palpitations: Secondary | ICD-10-CM

## 2016-11-29 DIAGNOSIS — I491 Atrial premature depolarization: Secondary | ICD-10-CM | POA: Diagnosis not present

## 2016-11-29 DIAGNOSIS — E119 Type 2 diabetes mellitus without complications: Secondary | ICD-10-CM | POA: Diagnosis not present

## 2016-11-29 DIAGNOSIS — I493 Ventricular premature depolarization: Secondary | ICD-10-CM

## 2016-11-29 DIAGNOSIS — I10 Essential (primary) hypertension: Secondary | ICD-10-CM

## 2016-11-29 NOTE — Progress Notes (Signed)
Cardiology Office Note   Date:  11/29/2016   ID:  Davaun, Quintela May 16, 1970, MRN [id number]  PCP:  Biagio Borg, MD    No chief complaint on file. palpitations   Wt Readings from Last 3 Encounters:  11/29/16 227 lb 12.8 oz (103.3 kg)  11/08/16 226 lb (102.5 kg)  02/28/16 228 lb (103.4 kg)       History of Present Illness: William Sheppard is a 46 y.o. male who is being seen today for the evaluation of palpitations at the request of Biagio Borg, MD.  He had a h/o Hodgkins lymphoma and was treated with chemotherapy, in the early 2000.  Sx have been going on for several years, but are now more frequent.  He can feel sx a few times a week.  Sx last  A few a seconds.  No sustained episodes.  Several more intense episodes of the same duration that " take my breath away."  On the left side of his chest, he can feel a "shock."  THis sensation lasts a second.  It occurs at least a few times a week.  THis sx is unrelated to the palpitations above.    Denies : Dizziness. Leg edema. Nitroglycerin use. Orthopnea. Palpitations. Paroxysmal nocturnal dyspnea. Shortness of breath. Syncope.   He walks a lot at work and has to lift heavy items.  Sx are not related to this activity.  He can do constant heavy lifting, for an entire 12 hour period where he is moving furniture, and has no problems.    He does some weight lifting without any problems.  No associated lightheadedness or syncope with the above sx.   No tobacco use.  No family h/o CAD.  He has been diabetic for 3 years.   Past Medical History:  Diagnosis Date  . Chronic headaches   . Diabetes mellitus   . Hodgkin's lymphoma ([place])   . Hyperlipidemia     Past Surgical History:  Procedure Laterality Date  . [place]  . WISDOM TOOTH EXTRACTION       Current Outpatient Prescriptions  Medication Sig Dispense Refill  . aspirin 81 MG EC tablet Take 1 tablet (81 mg total) by mouth daily. Swallow whole. 30  tablet 12  . glipiZIDE (GLIPIZIDE XL) 5 MG 24 hr tablet Take 1 tablet (5 mg total) by mouth daily with breakfast. 90 tablet 3  . glucose blood test strip And lancets 1/day 250.00 100 each 12  . LANCETS ULTRA THIN MISC   0  . lisinopril (PRINIVIL,ZESTRIL) 5 MG tablet TAKE 1 TABLET EACH DAY. 30 tablet 5  . metFORMIN (GLUCOPHAGE) 500 MG tablet Take 1 tablet (500 mg total) by mouth 2 (two) times daily with a meal. 180 tablet 3  . pravastatin (PRAVACHOL) 40 MG tablet Take 1 tablet (40 mg total) by mouth daily. 90 tablet 3   No current facility-administered medications for this visit.     Allergies:   Patient has no known allergies.    Social History:  The patient  reports that he has never smoked. He has never used smokeless tobacco. He reports that he drinks alcohol. He reports that he does not use drugs. Married  Family History:  The patient's family history includes Diabetes in his father.    ROS:  Please see the history of present illness.   Otherwise, review of systems are positive for palpitations.   All other systems are reviewed and negative.  PHYSICAL EXAM: VS:  BP 124/76   Pulse 89   Ht 6\' 2"  (1.88 m)   Wt 227 lb 12.8 oz (103.3 kg)   SpO2 98%   BMI 29.25 kg/m  , BMI Body mass index is 29.25 kg/m. GEN: Well nourished, well developed, in no acute distress  HEENT: normal  Neck: no JVD, carotid bruits, or masses Cardiac: RRR; no murmurs, rubs, or gallops,no edema  Respiratory:  clear to auscultation bilaterally, normal work of breathing GI: soft, nontender, nondistended, + BS MS: no deformity or atrophy  Skin: warm and dry, no rash Neuro:  Strength and sensation are intact Psych: euthymic mood, full affect   EKG:   The ekg ordered today demonstrates NSR, anterolateral T wave inversion   Recent Labs: 10/16/2016: ALT 49; BUN 20; Creatinine, Ser 1.12; Hemoglobin 13.3; Platelets 223.0; Potassium 4.4; Sodium 141; TSH 1.65   Lipid Panel    Component Value Date/Time    CHOL 149 10/16/2016 1016   TRIG 77.0 10/16/2016 1016   HDL 41.70 10/16/2016 1016   CHOLHDL 4 10/16/2016 1016   VLDL 15.4 10/16/2016 1016   LDLCALC 92 10/16/2016 1016   LDLDIRECT 83.0 07/22/2014 1442     Other studies Reviewed: Additional studies/ records that were reviewed today with results demonstrating: 2017 stress test negative for ischemia; 2015 Holter showed NSR with PACs and PVC.   ASSESSMENT AND PLAN:  1. Palpitations:  Known PACs and PVCs from prior monitoring.  Sx are consistent with these premature beats.  We discussed further monitoring but agreed to just watch and see if sx get worse.  He will let us know.  Avoid high dose caffeine.  He used to use energy drinks, but no longer.  2. Atypical chest pain:  Negative stress test in 2017.  No issues with heavy exertion.  No further ischemic w/u at this time.  3. DM: COntinue aggressive control.  A1C 6.6. 4. Hyperlipidemia: LDL controlled on pravastatin.  Continue current dose.  5. HTN: Well controlled today.  Continue ACE-I.     Current medicines are reviewed at length with the patient today.  The patient concerns regarding his medicines were addressed.  The following changes have been made:  No change  Labs/ tests ordered today include:  No orders of the defined types were placed in this encounter.   Recommend 150 minutes/week of aerobic exercise Low fat, low carb, high fiber diet recommended  Disposition:   FU in 1 year   Signed, Larae Grooms, MD  11/29/2016 9:11 AM    [facility] [place], [place], [place]  [address] Phone: [phone]; Fax: [phone]

## 2016-11-29 NOTE — Patient Instructions (Signed)

## 2016-12-04 ENCOUNTER — Encounter (HOSPITAL_COMMUNITY): Payer: Self-pay | Admitting: Emergency Medicine

## 2016-12-04 ENCOUNTER — Emergency Department (HOSPITAL_COMMUNITY)
Admission: EM | Admit: 2016-12-04 | Discharge: 2016-12-04 | Disposition: A | Discharge status: Home / Self Care | Payer: BLUE CROSS/BLUE SHIELD | Attending: Emergency Medicine | Admitting: Emergency Medicine

## 2016-12-04 DIAGNOSIS — M7918 Myalgia, other site: Secondary | ICD-10-CM | POA: Diagnosis not present

## 2016-12-04 DIAGNOSIS — E119 Type 2 diabetes mellitus without complications: Secondary | ICD-10-CM | POA: Diagnosis not present

## 2016-12-04 DIAGNOSIS — Z8571 Personal history of Hodgkin lymphoma: Secondary | ICD-10-CM | POA: Diagnosis not present

## 2016-12-04 DIAGNOSIS — M791 Myalgia, unspecified site: Secondary | ICD-10-CM | POA: Diagnosis not present

## 2016-12-04 DIAGNOSIS — Z7982 Long term (current) use of aspirin: Secondary | ICD-10-CM | POA: Insufficient documentation

## 2016-12-04 DIAGNOSIS — Z79899 Other long term (current) drug therapy: Secondary | ICD-10-CM | POA: Insufficient documentation

## 2016-12-04 DIAGNOSIS — Z7984 Long term (current) use of oral hypoglycemic drugs: Secondary | ICD-10-CM | POA: Insufficient documentation

## 2016-12-04 DIAGNOSIS — M62830 Muscle spasm of back: Secondary | ICD-10-CM | POA: Insufficient documentation

## 2016-12-04 MED ORDER — IBUPROFEN 600 MG PO TABS: 600.0000 mg | ORAL_TABLET | Freq: Four times a day (QID) | ORAL | 0 refills | Status: DC | PRN

## 2016-12-04 MED ORDER — CYCLOBENZAPRINE HCL 10 MG PO TABS: 10.0000 mg | ORAL_TABLET | Freq: Two times a day (BID) | ORAL | 0 refills | Status: DC | PRN

## 2016-12-04 MED ORDER — IBUPROFEN 200 MG PO TABS
600.0000 mg | ORAL_TABLET | Freq: Once | ORAL | Status: AC
  Administered 2016-12-04: 600 mg via ORAL
  Filled 2016-12-04: qty 3

## 2016-12-04 MED ORDER — CYCLOBENZAPRINE HCL 10 MG PO TABS
10.0000 mg | ORAL_TABLET | Freq: Once | ORAL | Status: AC
  Administered 2016-12-04: 10 mg via ORAL
  Filled 2016-12-04: qty 1

## 2016-12-04 NOTE — ED Triage Notes (Signed)
Patient c/o right mid back spasm that started on Sunday and reports flares up with moving and positional. Patient reports hurts when sitting and to change positions, so patient standing while in triage.

## 2016-12-04 NOTE — ED Provider Notes (Signed)
[facility] DEPT Provider Note   CSN: [id number] Arrival date & time: 12/04/16  0719     History   Chief Complaint Chief Complaint  Patient presents with  . back spasm    HPI William Sheppard is a 46 y.o. male.  HPI  46 y.o. male with a hx of DM, HLD, presents to the Emergency Department today due to right mid back spasm that started on Sunday. Notes hx same with flare ups with movement and position. Notes pain 10/10. Isolated to left mid back. Cramping sensation. Intermittent. Worse with ROM. Minimal at rest. No CP/SOB/ABD pain. No numbness/tingling. No fevers. No headache. No loss of bowel or bladder function. No saddle anesthesia. Took Tylenol PTA with moderate relief. No other symptoms noted.   Past Medical History:  Diagnosis Date  . Chronic headaches   . Diabetes mellitus   . Hodgkin's lymphoma ([place])   . Hyperlipidemia     Patient Active Problem List   Diagnosis Date Noted  . Rapid palpitations 11/08/2016  . Neck pain on left side 11/08/2016  . Left hip pain 02/28/2016  . Atypical chest pain 12/20/2015  . Dizziness 07/21/2015  . Hodgkin's lymphoma ([place]) 03/11/2015  . Diabetes mellitus type II, non insulin dependent ([place]) 03/11/2015  . Thalassemia, alpha ([place]) 03/11/2015  . Acute sinus infection 01/19/2015  . 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 Low serum testosterone level 11/04/2013  . Numbness 11/03/2012  . Inadequate sleep hygiene 10/23/2012  . Daytime somnolence 09/18/2012  . Other malaise and fatigue 09/18/2012  . Mid back pain on left side 05/31/2012  . Bilateral foot pain 07/27/2011  . Hyperlipidemia   . Encounter for well adult exam with abnormal findings 05/05/2011    Past Surgical History:  Procedure Laterality Date  . [place]  . WISDOM TOOTH EXTRACTION         Home Medications    Prior to Admission medications   Medication Sig Start Date End Date Taking? Authorizing Provider  aspirin 81 MG EC  tablet Take 1 tablet (81 mg total) by mouth daily. Swallow whole. 05/07/12   Biagio Borg, MD  glipiZIDE (GLIPIZIDE XL) 5 MG 24 hr tablet Take 1 tablet (5 mg total) by mouth daily with breakfast. 11/08/16   Biagio Borg, MD  glucose blood test strip And lancets 1/day 250.00 12/29/15   Biagio Borg, MD  LANCETS ULTRA THIN MISC  07/07/14   [provider]  lisinopril (PRINIVIL,ZESTRIL) 5 MG tablet TAKE 1 TABLET EACH DAY. 07/17/16   Biagio Borg, MD  metFORMIN (GLUCOPHAGE) 500 MG tablet Take 1 tablet (500 mg total) by mouth 2 (two) times daily with a meal. 11/08/16 11/08/17  Biagio Borg, MD  pravastatin (PRAVACHOL) 40 MG tablet Take 1 tablet (40 mg total) by mouth daily. 11/08/16   Biagio Borg, MD    Family History Family History  Problem Relation Age of Onset  . Diabetes Father     Social History Social History  Substance Use Topics  . Smoking status: Never Smoker  . Smokeless tobacco: Never Used  . Alcohol use Yes     Comment: social     Allergies   Patient has no known allergies.   Review of Systems Review of Systems ROS reviewed and all are negative for acute change except as noted in the HPI.  Physical Exam Updated Vital Signs BP (!) 132/98 (BP Location: Right Arm)   Pulse (!) 116   Temp  98.8 F (37.1 C) (Oral)   Resp 16   Ht 6\' 3"  (1.905 m)   Wt 103 kg (227 lb)   SpO2 92%   BMI 28.37 kg/m   Physical Exam  Constitutional: He is oriented to person, place, and time. Vital signs are normal. He appears well-developed and well-nourished. No distress.  Pt standing for comfort   HENT:  Head: Normocephalic and atraumatic.  Right Ear: Hearing, tympanic membrane, external ear and ear canal normal.  Left Ear: Hearing, tympanic membrane, external ear and ear canal normal.  Nose: Nose normal.  Mouth/Throat: Uvula is midline, oropharynx is clear and moist and mucous membranes are normal. No trismus in the jaw. No oropharyngeal exudate, posterior oropharyngeal  erythema or tonsillar abscesses.  Eyes: Pupils are equal, round, and reactive to light. Conjunctivae and EOM are normal.  Neck: Normal range of motion. Neck supple. No tracheal deviation present.  Cardiovascular: Normal rate, regular rhythm, S1 normal, S2 normal, normal heart sounds, intact distal pulses and normal pulses.   Pulmonary/Chest: Effort normal and breath sounds normal. No respiratory distress. He has no decreased breath sounds. He has no wheezes. He has no rhonchi. He has no rales.  Abdominal: Normal appearance and bowel sounds are normal. There is no tenderness.  Musculoskeletal: Normal range of motion.  Pt TTP left mid back along focal paraspinal muscle. No midline C/T/L tenderness. No CVA tenderness  Neurological: He is alert and oriented to person, place, and time.  Skin: Skin is warm and dry.  Psychiatric: He has a normal mood and affect. His speech is normal and behavior is normal. Thought content normal.  Nursing note and vitals reviewed.    ED Treatments / Results  Labs (all labs ordered are listed, but only abnormal results are displayed) Labs Reviewed - No data to display  EKG  EKG Interpretation None       Radiology No results found.  Procedures Procedures (including critical care time)  Medications Ordered in ED Medications  cyclobenzaprine (FLEXERIL) tablet 10 mg (not administered)  ibuprofen (ADVIL,MOTRIN) tablet 600 mg (not administered)     Initial Impression / Assessment and Plan / ED Course  I have reviewed the triage vital signs and the nursing notes.  Pertinent labs & imaging results that were available during my care of the patient were reviewed by me and considered in my medical decision making (see chart for details).  Final Clinical Impressions(s) / ED Diagnoses     {I have reviewed the relevant previous healthcare records.  {I obtained HPI from historian.   ED Course:  Assessment: Patient is a 46 y.o. male with a hx of DM, HLD,  presents to the Emergency Department today due to right mid back spasm that started on Sunday. Notes hx same with flare ups with movement and position. Notes pain 10/10. Isolated to left mid back. Cramping sensation. Intermittent. Worse with ROM. Minimal at rest. No CP/SOB/ABD pain. No numbness/tingling. No fevers. No headache. No loss of bowel or bladder function. No saddle anesthesia. Took Tylenol PTA with moderate relief. No neurological deficits appreciated. Patient is ambulatory. No warning symptoms of back pain including: fecal incontinence, urinary retention or overflow incontinence, night sweats, waking from sleep with back pain, unexplained fevers or weight loss, h/o cancer, IVDU, recent trauma. No concern for cauda equina, epidural abscess, or other serious cause of back pain. Conservative measures such as rest, ice/heat and pain medicine indicated with PCP follow-up if no improvement with conservative management.  Disposition/Plan:  DC  Home Additional Verbal discharge instructions given and discussed with patient.  Pt Instructed to f/u with PCP in the next week for evaluation and treatment of symptoms. Return precautions given Pt acknowledges and agrees with plan  Supervising Physician Milton Ferguson, MD  Final diagnoses:  Back muscle spasm  Musculoskeletal pain    New Prescriptions New Prescriptions   No medications on file     Shary Decamp, Hershal Coria 12/04/16 2633    Milton Ferguson, MD 12/05/16 [phone]

## 2016-12-04 NOTE — Discharge Instructions (Signed)
Please read and follow all provided instructions.  Your diagnoses today include:  1. Back muscle spasm   2. Musculoskeletal pain     Tests performed today include: Vital signs - see below for your results today  Medications prescribed:   Take any prescribed medications only as directed.  Home care instructions:  Follow any educational materials contained in this packet Please rest, use ice or heat on your back for the next several days Do not lift, push, pull anything more than 10 pounds for the next week  Follow-up instructions: Please follow-up with your primary care provider in the next 1 week for further evaluation of your symptoms.   Return instructions:  SEEK IMMEDIATE MEDICAL ATTENTION IF YOU HAVE: New numbness, tingling, weakness, or problem with the use of your arms or legs Severe back pain not relieved with medications Loss control of your bowels or bladder Increasing pain in any areas of the body (such as chest or abdominal pain) Shortness of breath, dizziness, or fainting.  Worsening nausea (feeling sick to your stomach), vomiting, fever, or sweats Any other emergent concerns regarding your health   Additional Information:  Your vital signs today were: BP (!) 132/98 (BP Location: Right Arm)    Pulse (!) 116    Temp 98.8 F (37.1 C) (Oral)    Resp 16    Ht 6\' 3"  (1.905 m)    Wt 103 kg (227 lb)    SpO2 92%    BMI 28.37 kg/m  If your blood pressure (BP) was elevated above 135/85 this visit, please have this repeated by your doctor within one month. --------------

## 2016-12-07 ENCOUNTER — Ambulatory Visit (INDEPENDENT_AMBULATORY_CARE_PROVIDER_SITE_OTHER): Payer: BLUE CROSS/BLUE SHIELD | Admitting: Family Medicine

## 2016-12-07 ENCOUNTER — Encounter: Payer: Self-pay | Admitting: Family Medicine

## 2016-12-07 VITALS — BP 138/72 | HR 94 | Temp 98.0°F | Ht 75.0 in | Wt 210.0 lb

## 2016-12-07 DIAGNOSIS — M545 Low back pain, unspecified: Secondary | ICD-10-CM

## 2016-12-07 NOTE — Assessment & Plan Note (Addendum)
Pain seems musculoskeletal in nature. Does not appear to be associated with his lungs. Does not appear to be associated with his abdomen.  - Counseled due to the acute nature of his pain this is more likely to be muscular in nature - Continue meloxicam and Flexeril  - Counseled on home exercise therapy  - Patient has a history of Hodgkin's lymphoma so if there is any concern to consider a CT

## 2016-12-07 NOTE — Patient Instructions (Addendum)
Thank you for coming in,   Please try the exercises that I have provided you.  Please continue taking the meloxicam and Flexeril.  Please let me know if your symptoms do not improve.   Please feel free to call with any questions or concerns at any time, at [phone]. --Dr. Raeford Razor

## 2016-12-07 NOTE — Progress Notes (Signed)
William Sheppard - 46 y.o. male MRN [id number]  Date of birth: 12/14/1970  SUBJECTIVE:  Including CC & ROS.  Chief Complaint  Patient presents with  . Back Pain    Located lower back-he states pain started on 12/02/16, it got worse so he went to ER at [facility] on 12/04/16. He was prescribed Flexeril and Ibuprofen which has improved the pain and spasms.    William Sheppard is a 46 y.o. male that is  presenting with right-sided low back pain. The pain started on Sunday. He was seen in the emergency room and provided medications. These medications have improved some of his pain. He denies any blood in his urine or pain with urination. His bowel movements have been normal. He does have some pain with deep breathing. Does not have any pain that radiates down his legs. The pain wraps around to the front occasionally. Has not had any fevers or chills. Denies any nausea or vomiting. Has not had any prior back surgery or abdominal surgery. The pain is sharp and stabbing in nature.[name]   He was seen at the emergency department on 10/23.  Patient was diagnosed with muscle spasms and provided Flexeril and ibuprofen.   Review of Systems  Constitutional: Negative for fever.  Gastrointestinal: Negative for abdominal pain.  Musculoskeletal: Positive for back pain. Negative for gait problem.  Skin: Negative for color change.  Neurological: Negative for weakness and numbness.    HISTORY: Past Medical, Surgical, Social, and Family History Reviewed & Updated per EMR.   Pertinent Historical Findings include:  Past Medical History:  Diagnosis Date  . Chronic headaches   . Diabetes mellitus   . Hodgkin's lymphoma ([place])   . Hyperlipidemia     Past Surgical History:  Procedure Laterality Date  . [place]  . WISDOM TOOTH EXTRACTION      No Known Allergies  Family History  Problem Relation Age of Onset  . Diabetes Father      Social History   Social History  . Marital status: Married    Spouse name: N/A  . Number of children: N/A  . Years of education: masters   Occupational History  . Oceanographer   Social History Main Topics  . Smoking status: Never Smoker  . Smokeless tobacco: Never Used  . Alcohol use Yes     Comment: social  . Drug use: No  . Sexual activity: Not on file   Other Topics Concern  . Not on file   Social History Narrative  . No narrative on file     PHYSICAL EXAM:  VS: BP 138/72 (BP Location: Left Arm, Patient Position: Sitting, Cuff Size: Normal)   Pulse 94   Temp 98 F (36.7 C) (Oral)   Ht 6\' 3"  (1.905 m)   Wt 210 lb (95.3 kg)   SpO2 97%   BMI 26.25 kg/m  Physical Exam Gen: NAD, alert, cooperative with exam, well-appearing ENT: normal lips, normal nasal mucosa,  Eye: normal EOM, normal conjunctiva and lids CV:  no edema, +2 pedal pulses   Resp: no accessory muscle use, non-labored, Clear to auscultation bilaterally GI: no masses or tenderness, no hernia, soft, no distention, normal bowel sounds negative McBurney's point, negative Rovsing sign,  Skin: no rashes, no areas of induration  Neuro: normal tone, normal sensation to touch Psych:  normal insight, alert and oriented MSK:  Back Exam:  Inspection: Unremarkable  Palpable tenderness: None. Normal gait Normal strength with hip flexion  to resistance. Normal internal and external rotation of the hips. Normal deep tendon reflexes Gait unremarkable. SLR laying: Negative  XSLR laying: Negative  FABER: negative. Neurovascularly intact    ASSESSMENT & PLAN:   I spent 25 minutes with this patient, greater than 50% was face-to-face time counseling regarding the below diagnosis.  Acute right-sided low back pain without sciatica Pain seems musculoskeletal in nature. Does not appear to be associated with his lungs. Does not appear to be associated with his abdomen.  - Counseled due to the acute nature of his pain this is more likely to be muscular in nature -  Continue meloxicam and Flexeril  - Counseled on home exercise therapy  - Patient has a history of Hodgkin's lymphoma so if there is any concern to consider a CT

## 2017-01-01 ENCOUNTER — Other Ambulatory Visit (HOSPITAL_BASED_OUTPATIENT_CLINIC_OR_DEPARTMENT_OTHER): Payer: BLUE CROSS/BLUE SHIELD

## 2017-01-01 ENCOUNTER — Telehealth (HOSPITAL_COMMUNITY): Payer: Self-pay | Admitting: Internal Medicine

## 2017-01-01 DIAGNOSIS — E119 Type 2 diabetes mellitus without complications: Secondary | ICD-10-CM

## 2017-01-01 DIAGNOSIS — D569 Thalassemia, unspecified: Secondary | ICD-10-CM

## 2017-01-01 DIAGNOSIS — C8194 Hodgkin lymphoma, unspecified, lymph nodes of axilla and upper limb: Secondary | ICD-10-CM

## 2017-01-01 DIAGNOSIS — D560 Alpha thalassemia: Secondary | ICD-10-CM

## 2017-01-01 DIAGNOSIS — C8113 Nodular sclerosis classical Hodgkin lymphoma, intra-abdominal lymph nodes: Secondary | ICD-10-CM

## 2017-01-01 LAB — CBC WITH DIFFERENTIAL/PLATELET
BASO%: 0.2 % (ref 0.0–2.0)
BASOS ABS: 0 10*3/uL (ref 0.0–0.1)
EOS ABS: 0.1 10*3/uL (ref 0.0–0.5)
EOS%: 1.2 % (ref 0.0–7.0)
HEMATOCRIT: 39.7 % (ref 38.4–49.9)
HGB: 12.4 g/dL — ABNORMAL LOW (ref 13.0–17.1)
LYMPH#: 2.2 10*3/uL (ref 0.9–3.3)
LYMPH%: 54.1 % — AB (ref 14.0–49.0)
MCH: 22.1 pg — ABNORMAL LOW (ref 27.2–33.4)
MCHC: 31.2 g/dL — AB (ref 32.0–36.0)
MCV: 70.9 fL — AB (ref 79.3–98.0)
MONO#: 0.3 10*3/uL (ref 0.1–0.9)
MONO%: 7.3 % (ref 0.0–14.0)
NEUT#: 1.5 10*3/uL (ref 1.5–6.5)
NEUT%: 37.2 % — AB (ref 39.0–75.0)
PLATELETS: 186 10*3/uL (ref 140–400)
RBC: 5.6 10*6/uL (ref 4.20–5.82)
RDW: 15.3 % — ABNORMAL HIGH (ref 11.0–14.6)
WBC: 4.1 10*3/uL (ref 4.0–10.3)

## 2017-01-01 LAB — COMPREHENSIVE METABOLIC PANEL
ALT: 46 U/L (ref 0–55)
ANION GAP: 8 meq/L (ref 3–11)
AST: 19 U/L (ref 5–34)
Albumin: 4.1 g/dL (ref 3.5–5.0)
Alkaline Phosphatase: 50 U/L (ref 40–150)
BILIRUBIN TOTAL: 0.52 mg/dL (ref 0.20–1.20)
BUN: 17.1 mg/dL (ref 7.0–26.0)
CALCIUM: 9.2 mg/dL (ref 8.4–10.4)
CHLORIDE: 108 meq/L (ref 98–109)
CO2: 26 mEq/L (ref 22–29)
CREATININE: 1.1 mg/dL (ref 0.7–1.3)
Glucose: 115 mg/dl (ref 70–140)
Potassium: 4 mEq/L (ref 3.5–5.1)
Sodium: 141 mEq/L (ref 136–145)
Total Protein: 8 g/dL (ref 6.4–8.3)

## 2017-01-01 LAB — LACTATE DEHYDROGENASE: LDH: 149 U/L (ref 125–245)

## 2017-01-01 LAB — IRON AND TIBC
%SAT: 27 % (ref 20–55)
IRON: 80 ug/dL (ref 42–163)
TIBC: 291 ug/dL (ref 202–409)
UIBC: 212 ug/dL (ref 117–376)

## 2017-01-01 LAB — FERRITIN: FERRITIN: 145 ng/mL (ref 22–316)

## 2017-01-01 NOTE — Telephone Encounter (Signed)
10/5need info off most recent ins card-pt will call back/lb 11/30/16 Called pt and lmsg for him to CB to get scheduled for echo.Vassie Moment 10/26/I agree, ok to hold on echo for now. thanks per Dr. Santiago Bumpers  He will be removed from the workqueue.

## 2017-01-10 NOTE — Progress Notes (Signed)
[place]  [phone] [phone]     ID: William Sheppard DOB: 1970/03/09  MR#: [id number]  JYN#:[id number]  Patient Care Team: Biagio Borg, MD as PCP - General (Internal Medicine) PCP: Biagio Borg, MD OTHER MD:  CHIEF COMPLAINT: History of Hodgkin's lymphoma, recent upper respiratory infection  CURRENT TREATMENT: Observation   CANCER HISTORY: William Sheppard has a history of Hodgkin's disease, which was diagnosed by CT-guided mediastinal biopsy October 2000. It was stage IIB. He was treated with standard chemotherapy, ABVD, 6, all treatments completed in May 2001. There has not been any evidence of recurrence  INTERVAL HISTORY: William Sheppard returns today for follow-up of his history of remote Hodgkin's lymphoma.  From that point of view, he has had no unexplained weight loss, no unexplained fatigue, no drenching sweats, no fever, and ensured no "B" symptoms.  He was referred back here because of a possible left cervical lymph node.  That seems to have resolved without intervention  REVIEW OF SYSTEMS: Lymon is experiencing pain to the left side of his neck that started a few weeks ago, but it has since improved. He can still feel a pull, but the swelling has decreased. The patient states he is diabetic and he is taking metformin and glipizide. He hasn't been checking his sugars as often as he should, but notes that when he does they are normal. 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 The patient will also lift at home, occasionally go to the gym and at times will help is friend with his moving company. He tries to eat a lot healthy by consuming a lot of vegetables. He denies unusual headaches, visual changes, nausea, vomiting, or dizziness. There has been no unusual cough, phlegm production, or pleurisy. This been no change in bowel or bladder habits. He denies unexplained fatigue or  unexplained weight loss, bleeding, rash, or fever. A detailed review of systems was otherwise entirely stable.    PAST MEDICAL HISTORY: Past Medical History:  Diagnosis Date  . Chronic headaches   . Diabetes mellitus   . Hodgkin's lymphoma ([place])   . Hyperlipidemia     PAST SURGICAL HISTORY: Past Surgical History:  Procedure Laterality Date  . [place]  . WISDOM TOOTH EXTRACTION      FAMILY HISTORY Family History  Problem Relation Age of Onset  . Diabetes Father    The patient's parents are still living, his father being 19 and his mother 22 as of January 2017. The patient had no brothers. One sister. There is no history of cancer in the family.  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 His wife, [name]), works as a Dance movement psychotherapist. Son, [name] attends [employer] and works at [employer], age 60; daughter  [name], 54, graduated high school and is working at [employer]. She is thinking of pursuing a career in the medical field.   ADVANCED DIRECTIVES:  not in place    HEALTH MAINTENANCE: Social History   Tobacco Use  . Smoking status: Never Smoker  . Smokeless tobacco: Never Used  Substance Use Topics  . Alcohol use: Yes    Comment: social  . Drug use: No     Colonoscopy:  PAP:  Bone density:  Lipid panel:  No Known Allergies  Current Outpatient Medications  Medication Sig Dispense Refill  . aspirin 81  MG EC tablet Take 1 tablet (81 mg total) by mouth daily. Swallow whole. 30 tablet 12  . glipiZIDE (GLIPIZIDE XL) 5 MG 24 hr tablet Take 1 tablet (5 mg total) by mouth daily with breakfast. 90 tablet 3  . glucose blood test strip And lancets 1/day 250.00 100 each 12  . ibuprofen (ADVIL,MOTRIN) 600 MG tablet Take 1 tablet (600 mg total) by mouth every 6 (six) hours as needed. 30 tablet 0  . lisinopril (PRINIVIL,ZESTRIL) 5 MG tablet TAKE 1 TABLET EACH DAY. 30 tablet 5  . metFORMIN (GLUCOPHAGE) 500 MG  tablet Take 1 tablet (500 mg total) by mouth 2 (two) times daily with a meal. 180 tablet 3  . pravastatin (PRAVACHOL) 40 MG tablet Take 1 tablet (40 mg total) by mouth daily. 90 tablet 3   No current facility-administered medications for this visit.     OBJECTIVE: middle-aged white man in no acute distress Vitals:   01/11/17 0909  BP: 137/90  Pulse: 87  Resp: 18  Temp: 98.6 F (37 C)  SpO2: 98%     Body mass index is 28.86 kg/m.    ECOG FS:0 - Asymptomatic  Sclerae unicteric, pupils round and equal Oropharynx clear and moist No cervical or supraclavicular adenopathy, no axillary or inguinal adenopathy Lungs no rales or rhonchi Heart regular rate and rhythm Abd soft, nontender, positive bowel sounds, no splenomegaly MSK no focal spinal tenderness, no upper extremity lymphedema Neuro: nonfocal, well oriented, appropriate affect  LAB RESULTS:  CMP     Component Value Date/Time   NA 141 01/01/2017 0919   K 4.0 01/01/2017 0919   CL 107 10/16/2016 1016   CO2 26 01/01/2017 0919   GLUCOSE 115 01/01/2017 0919   BUN 17.1 01/01/2017 0919   CREATININE 1.1 01/01/2017 0919   CALCIUM 9.2 01/01/2017 0919   PROT 8.0 01/01/2017 0919   ALBUMIN 4.1 01/01/2017 0919   AST 19 01/01/2017 0919   ALT 46 01/01/2017 0919   ALKPHOS 50 01/01/2017 0919   BILITOT 0.52 01/01/2017 0919   GFRNONAA >60 02/03/2015 2200   GFRAA >60 02/03/2015 2200    INo results found for: SPEP, UPEP  Lab Results  Component Value Date   WBC 4.1 01/01/2017   NEUTROABS 1.5 01/01/2017   HGB 12.4 (L) 01/01/2017   HCT 39.7 01/01/2017   MCV 70.9 (L) 01/01/2017   PLT 186 01/01/2017      Chemistry      Component Value Date/Time   NA 141 01/01/2017 0919   K 4.0 01/01/2017 0919   CL 107 10/16/2016 1016   CO2 26 01/01/2017 0919   BUN 17.1 01/01/2017 0919   CREATININE 1.1 01/01/2017 0919      Component Value Date/Time   CALCIUM 9.2 01/01/2017 0919   ALKPHOS 50 01/01/2017 0919   AST 19 01/01/2017 0919   ALT  46 01/01/2017 0919   BILITOT 0.52 01/01/2017 0919       No results found for: LABCA2  No components found for: LABCA125  No results for input(s): INR in the last 168 hours.  Urinalysis    Component Value Date/Time   COLORURINE YELLOW 10/16/2016 1016   APPEARANCEUR CLEAR 10/16/2016 1016   LABSPEC 1.025 10/16/2016 1016   PHURINE 6.0 10/16/2016 1016   GLUCOSEU NEGATIVE 10/16/2016 1016   HGBUR TRACE-INTACT (A) 10/16/2016 1016   BILIRUBINUR NEGATIVE 10/16/2016 1016   BILIRUBINUR neg. 05/31/2012 1053   KETONESUR NEGATIVE 10/16/2016 1016   PROTEINUR 30+ 05/31/2012 1053   UROBILINOGEN 0.2 10/16/2016  [place] 10/16/2016 [place] 10/16/2016 1016    STUDIES: No results found.  ASSESSMENT: 46 y.o. [place] man with a history of Hodgkin's disease, stage IIB, diagnosed by CT-guided mediastinal biopsy October 2000, and treated with ABVD 6, all treatment completed May 2001.  (1) thalassemia with very low MCV's but no anemia and repeatedly normal iron studies (no iron deficiency  (2) diabetes mellitus  (3) hypertension  PLAN: Deshane is now 17 years out since completion of his treatment for Hodgkin's lymphoma with no evidence of disease recurrence.  This is very favorable.  We talked about his diabetes.  It is not doing poorly, but I think he would do even better if in addition to the walking he does not work he walked between 1 and 2 miles a day.  That would get him closer to 10,000 steps a day.  He is taking some supplements.  I could not vouch that they are helpful or harmful as we have very little data on those agents.  We reviewed his lab work, which show a very minimal drop in his hemoglobin, of no consequence.  He continues to have significantly microcytic red cells but a ferritin greater than 100 and normal iron studies.  This is due to his thalassemia, which was previously noted  In short I think he is doing terrific.  I have not made a  return appointment for him here but will be glad to see him at any point in the future if and when the need arises  Magrinat, Virgie Dad, MD  01/11/17 9:24 AM Medical Oncology and Hematology [facility] [place], [place] [address] Tel. [phone]    Fax. [phone]   This document serves as a record of services personally performed by Chauncey Cruel, MD. It was created on his behalf by Margit Banda, a trained medical scribe. The creation of this record is based on the scribe's personal observations and the provider's statements to them.   I have reviewed the above documentation for accuracy and completeness, and I agree with the above.

## 2017-01-11 ENCOUNTER — Ambulatory Visit (HOSPITAL_BASED_OUTPATIENT_CLINIC_OR_DEPARTMENT_OTHER): Payer: BLUE CROSS/BLUE SHIELD | Admitting: Oncology

## 2017-01-11 VITALS — BP 137/90 | HR 87 | Temp 98.6°F | Resp 18 | Ht 75.0 in | Wt 230.9 lb

## 2017-01-11 DIAGNOSIS — I10 Essential (primary) hypertension: Secondary | ICD-10-CM

## 2017-01-11 DIAGNOSIS — D569 Thalassemia, unspecified: Secondary | ICD-10-CM

## 2017-01-11 DIAGNOSIS — E119 Type 2 diabetes mellitus without complications: Secondary | ICD-10-CM | POA: Diagnosis not present

## 2017-01-11 DIAGNOSIS — Z8571 Personal history of Hodgkin lymphoma: Secondary | ICD-10-CM | POA: Diagnosis not present

## 2017-01-11 DIAGNOSIS — C8194 Hodgkin lymphoma, unspecified, lymph nodes of axilla and upper limb: Secondary | ICD-10-CM

## 2017-01-11 DIAGNOSIS — D560 Alpha thalassemia: Secondary | ICD-10-CM

## 2017-01-11 DIAGNOSIS — M542 Cervicalgia: Secondary | ICD-10-CM

## 2017-01-25 ENCOUNTER — Other Ambulatory Visit: Payer: Self-pay | Admitting: Internal Medicine

## 2017-02-27 ENCOUNTER — Other Ambulatory Visit: Payer: Self-pay | Admitting: Internal Medicine

## 2017-04-04 ENCOUNTER — Other Ambulatory Visit: Payer: Self-pay | Admitting: Internal Medicine

## 2017-05-07 ENCOUNTER — Other Ambulatory Visit: Payer: Self-pay | Admitting: Internal Medicine

## 2017-06-14 ENCOUNTER — Encounter: Payer: Self-pay | Admitting: Internal Medicine

## 2017-07-06 ENCOUNTER — Other Ambulatory Visit: Payer: Self-pay | Admitting: Internal Medicine

## 2017-07-18 ENCOUNTER — Other Ambulatory Visit (INDEPENDENT_AMBULATORY_CARE_PROVIDER_SITE_OTHER): Payer: BLUE CROSS/BLUE SHIELD

## 2017-07-18 ENCOUNTER — Ambulatory Visit: Payer: BLUE CROSS/BLUE SHIELD | Admitting: Internal Medicine

## 2017-07-18 ENCOUNTER — Encounter: Payer: Self-pay | Admitting: Internal Medicine

## 2017-07-18 VITALS — BP 128/88 | HR 93 | Temp 98.1°F | Ht 75.0 in | Wt 225.0 lb

## 2017-07-18 DIAGNOSIS — E291 Testicular hypofunction: Secondary | ICD-10-CM

## 2017-07-18 DIAGNOSIS — N320 Bladder-neck obstruction: Secondary | ICD-10-CM

## 2017-07-18 DIAGNOSIS — E119 Type 2 diabetes mellitus without complications: Secondary | ICD-10-CM

## 2017-07-18 DIAGNOSIS — E782 Mixed hyperlipidemia: Secondary | ICD-10-CM

## 2017-07-18 DIAGNOSIS — Z114 Encounter for screening for human immunodeficiency virus [HIV]: Secondary | ICD-10-CM

## 2017-07-18 LAB — URINALYSIS, ROUTINE W REFLEX MICROSCOPIC
BILIRUBIN URINE: NEGATIVE
KETONES UR: NEGATIVE
LEUKOCYTES UA: NEGATIVE
Nitrite: NEGATIVE
PH: 6 (ref 5.0–8.0)
Specific Gravity, Urine: >=1.03 — AB (ref 1.000–1.030)
UROBILINOGEN UA: 0.2 (ref 0.0–1.0)
Urine Glucose: NEGATIVE

## 2017-07-18 LAB — CBC WITH DIFFERENTIAL/PLATELET
BASOS PCT: 0.5 % (ref 0.0–3.0)
Basophils Absolute: 0 10*3/uL (ref 0.0–0.1)
EOS ABS: 0.1 10*3/uL (ref 0.0–0.7)
Eosinophils Relative: 1.4 % (ref 0.0–5.0)
HCT: 40.3 % (ref 39.0–52.0)
HEMOGLOBIN: 12.8 g/dL — AB (ref 13.0–17.0)
LYMPHS ABS: 2.2 10*3/uL (ref 0.7–4.0)
Lymphocytes Relative: 49.2 % — ABNORMAL HIGH (ref 12.0–46.0)
MCHC: 31.8 g/dL (ref 30.0–36.0)
MCV: 69.7 fl — ABNORMAL LOW (ref 78.0–100.0)
MONO ABS: 0.4 10*3/uL (ref 0.1–1.0)
Monocytes Relative: 8.4 % (ref 3.0–12.0)
NEUTROS PCT: 40.5 % — AB (ref 43.0–77.0)
Neutro Abs: 1.8 10*3/uL (ref 1.4–7.7)
Platelets: 223 10*3/uL (ref 150.0–400.0)
RBC: 5.78 Mil/uL (ref 4.22–5.81)
RDW: 15.4 % (ref 11.5–15.5)
WBC: 4.5 10*3/uL (ref 4.0–10.5)

## 2017-07-18 LAB — LIPID PANEL
CHOL/HDL RATIO: 3
Cholesterol: 146 mg/dL (ref 0–200)
HDL: 46.1 mg/dL (ref >39.00)
LDL CALC: 64 mg/dL (ref 0–99)
NONHDL: 99.67
Triglycerides: 177 mg/dL — ABNORMAL HIGH (ref 0.0–149.0)
VLDL: 35.4 mg/dL (ref 0.0–40.0)

## 2017-07-18 LAB — HEPATIC FUNCTION PANEL
ALT: 42 U/L (ref 0–53)
AST: 23 U/L (ref 0–37)
Albumin: 4.6 g/dL (ref 3.5–5.2)
Alkaline Phosphatase: 50 U/L (ref 39–117)
BILIRUBIN DIRECT: 0.1 mg/dL (ref 0.0–0.3)
BILIRUBIN TOTAL: 0.3 mg/dL (ref 0.2–1.2)
Total Protein: 7.7 g/dL (ref 6.0–8.3)

## 2017-07-18 LAB — BASIC METABOLIC PANEL
BUN: 18 mg/dL (ref 6–23)
CHLORIDE: 105 meq/L (ref 96–112)
CO2: 28 meq/L (ref 19–32)
Calcium: 10.1 mg/dL (ref 8.4–10.5)
Creatinine, Ser: 1.14 mg/dL (ref 0.40–1.50)
GFR: 89.08 mL/min (ref >60.00)
GLUCOSE: 128 mg/dL — AB (ref 70–99)
POTASSIUM: 4.1 meq/L (ref 3.5–5.1)
Sodium: 141 mEq/L (ref 135–145)

## 2017-07-18 LAB — HEMOGLOBIN A1C: HEMOGLOBIN A1C: 6.6 % — AB (ref 4.6–6.5)

## 2017-07-18 LAB — MICROALBUMIN / CREATININE URINE RATIO
Creatinine,U: 244.7 mg/dL
Microalb Creat Ratio: 4.6 mg/g (ref 0.0–30.0)
Microalb, Ur: 11.3 mg/dL — ABNORMAL HIGH (ref 0.0–1.9)

## 2017-07-18 LAB — PSA: PSA: 0.87 ng/mL (ref 0.10–4.00)

## 2017-07-18 LAB — TESTOSTERONE: Testosterone: 286.25 ng/dL — ABNORMAL LOW (ref 300.00–890.00)

## 2017-07-18 LAB — TSH: TSH: 1.15 u[IU]/mL (ref 0.35–4.50)

## 2017-07-18 NOTE — Patient Instructions (Addendum)

## 2017-07-18 NOTE — Progress Notes (Signed)
Subjective:    Patient ID: William Sheppard, male    DOB: 02/16/1970, 47 y.o.   MRN: [id number]  HPI   Here to f/u; overall doing ok,  Pt denies chest pain, increasing sob or doe, wheezing, orthopnea, PND, increased LE swelling, palpitations, dizziness or syncope.  Pt denies new neurological symptoms such as new headache, or facial or extremity weakness or numbness.  Pt denies polydipsia, polyuria, or low sugar episode.  Pt states overall good compliance with meds, mostly trying to follow appropriate diet, with wt overall stable,  but little exercise however.  Declines immunizations for today  Past Medical History:  Diagnosis Date  . Chronic headaches   . Diabetes mellitus   . Hodgkin's lymphoma ([place])   . Hyperlipidemia    Past Surgical History:  Procedure Laterality Date  . [place]  . WISDOM TOOTH EXTRACTION      reports that he has never smoked. He has never used smokeless tobacco. He reports that he drinks alcohol. He reports that he does not use drugs. family history includes Diabetes in his father. No Known Allergies Current Outpatient Medications on File Prior to Visit  Medication Sig Dispense Refill  . aspirin 81 MG EC tablet Take 1 tablet (81 mg total) by mouth daily. Swallow whole. 30 tablet 12  . glipiZIDE (GLIPIZIDE XL) 5 MG 24 hr tablet Take 1 tablet (5 mg total) by mouth daily with breakfast. 90 tablet 3  . glucose blood test strip And lancets 1/day 250.00 100 each 12  . ibuprofen (ADVIL,MOTRIN) 600 MG tablet Take 1 tablet (600 mg total) by mouth every 6 (six) hours as needed. 30 tablet 0  . lisinopril (PRINIVIL,ZESTRIL) 5 MG tablet TAKE 1 TABLET EACH DAY. 30 tablet 2  . metFORMIN (GLUCOPHAGE) 500 MG tablet Take 1 tablet (500 mg total) by mouth 2 (two) times daily with a meal. 180 tablet 3  . pravastatin (PRAVACHOL) 40 MG tablet Take 1 tablet (40 mg total) by mouth daily. 90 tablet 3   No current facility-administered medications on file prior to visit.      Review of Systems  Constitutional: Negative for other unusual diaphoresis or sweats HENT: Negative for ear discharge or swelling Eyes: Negative for other worsening visual disturbances Respiratory: Negative for stridor or other swelling  Gastrointestinal: Negative for worsening distension or other blood Genitourinary: Negative for retention or other urinary change Musculoskeletal: Negative for other MSK pain or swelling Skin: Negative for color change or other new lesions Neurological: Negative for worsening tremors and other numbness  Psychiatric/Behavioral: Negative for worsening agitation or other fatigue All other system neg per pt    Objective:   Physical Exam BP 128/88   Pulse 93   Temp 98.1 F (36.7 C) (Oral)   Ht 6\' 3"  (1.905 m)   Wt 225 lb (102.1 kg)   SpO2 100%   BMI 28.12 kg/m  VS noted,  Constitutional: Pt appears in NAD HENT: Head: NCAT.  Right Ear: External ear normal.  Left Ear: External ear normal.  Eyes: . Pupils are equal, round, and reactive to light. Conjunctivae and EOM are normal Nose: without d/c or deformity Neck: Neck supple. Gross normal ROM Cardiovascular: Normal rate and regular rhythm.   Pulmonary/Chest: Effort normal and breath sounds without rales or wheezing.  Abd:  Soft, NT, ND, + BS, no organomegaly Neurological: Pt is alert. At baseline orientation, motor grossly intact Skin: Skin is warm. No rashes, other new lesions, no LE edema Psychiatric:  Pt behavior is normal without agitation  No other exam findings    Assessment & Plan:

## 2017-07-19 ENCOUNTER — Encounter: Payer: Self-pay | Admitting: Internal Medicine

## 2017-07-19 LAB — HIV ANTIBODY (ROUTINE TESTING W REFLEX): HIV 1&2 Ab, 4th Generation: NONREACTIVE

## 2017-07-19 MED ORDER — TESTOSTERONE 50 MG/5GM (1%) TD GEL: 5.0000 g | Freq: Every day | TRANSDERMAL | 1 refills | Status: DC

## 2017-07-21 NOTE — Assessment & Plan Note (Signed)
For testosterone level with next labs,  to f/u any worsening symptoms or concerns

## 2017-07-21 NOTE — Assessment & Plan Note (Signed)
stable overall by history and exam, recent data reviewed with pt, and pt to continue medical treatment as before,  to f/u any worsening symptoms or concerns Lab Results  Component Value Date   HGBA1C 6.6 (H) 07/18/2017   

## 2017-07-21 NOTE — Assessment & Plan Note (Signed)
stable overall by history and exam, recent data reviewed with pt, and pt to continue medical treatment as before,  to f/u any worsening symptoms or concerns Lab Results  Component Value Date   LDLCALC 64 07/18/2017

## 2017-09-27 ENCOUNTER — Encounter: Payer: Self-pay | Admitting: Internal Medicine

## 2017-09-27 ENCOUNTER — Ambulatory Visit (INDEPENDENT_AMBULATORY_CARE_PROVIDER_SITE_OTHER)
Admission: RE | Admit: 2017-09-27 | Discharge: 2017-09-27 | Disposition: A | Discharge status: Home / Self Care | Payer: BLUE CROSS/BLUE SHIELD | Source: Ambulatory Visit | Attending: Internal Medicine | Admitting: Internal Medicine

## 2017-09-27 ENCOUNTER — Ambulatory Visit (INDEPENDENT_AMBULATORY_CARE_PROVIDER_SITE_OTHER): Payer: BLUE CROSS/BLUE SHIELD | Admitting: Internal Medicine

## 2017-09-27 VITALS — BP 120/80 | HR 86 | Temp 98.5°F | Ht 75.0 in | Wt 224.5 lb

## 2017-09-27 DIAGNOSIS — M79644 Pain in right finger(s): Secondary | ICD-10-CM

## 2017-09-27 DIAGNOSIS — E119 Type 2 diabetes mellitus without complications: Secondary | ICD-10-CM

## 2017-09-27 HISTORY — DX: Pain in right finger(s): M79.644

## 2017-09-27 MED ORDER — MELOXICAM 15 MG PO TABS: 15.0000 mg | ORAL_TABLET | Freq: Every day | ORAL | 2 refills | Status: DC | PRN

## 2017-09-27 NOTE — Patient Instructions (Signed)
Please take all new medication as prescribed - the anti-inflammatory (mobic) for pain and swelling  Please continue all other medications as before, and refills have been done if requested.  Please have the pharmacy call with any other refills you may need.  You will be contacted regarding the referral for: Hand Surgury  Please keep your appointments with your specialists as you may have planned  Please go to the XRAY Department in the Basement (go straight as you get off the elevator) for the x-ray testing  You will be contacted by phone if any changes need to be made immediately.  Otherwise, you will receive a letter about your results with an explanation, but please check with MyChart first.  Please remember to sign up for MyChart if you have not done so, as this will be important to you in the future with finding out test results, communicating by private email, and scheduling acute appointments online when needed.

## 2017-09-27 NOTE — Progress Notes (Signed)
Subjective:    Patient ID: William Sheppard, male    DOB: 1970-10-18, 47 y.o.   MRN: [id number]  HPI  Here to f/u with c/o 3 mo right thumb pain just not resolving, intermittent, mild to occasionally moderate, sharp, located primarily at the [facility] and a tender area at the thenar eminence at the [facility].  No swelling except for mild bony degenerative change.  Denies recent overuse or trauma, but just cannot use thumb with FROM as much as left.  Pt denies chest pain, increased sob or doe, wheezing, orthopnea, PND, increased LE swelling, palpitations, dizziness or syncope.  Pt denies new neurological symptoms such as new headache, or facial or extremity weakness or numbness   Pt denies polydipsia, polyuria, Past Medical History:  Diagnosis Date  . Chronic headaches   . Diabetes mellitus   . Hodgkin's lymphoma ([place])   . Hyperlipidemia    Past Surgical History:  Procedure Laterality Date  . [place]  . WISDOM TOOTH EXTRACTION      reports that he has never smoked. He has never used smokeless tobacco. He reports that he drinks alcohol. He reports that he does not use drugs. family history includes Diabetes in his father. No Known Allergies Current Outpatient Medications on File Prior to Visit  Medication Sig Dispense Refill  . aspirin 81 MG EC tablet Take 1 tablet (81 mg total) by mouth daily. Swallow whole. 30 tablet 12  . glipiZIDE (GLIPIZIDE XL) 5 MG 24 hr tablet Take 1 tablet (5 mg total) by mouth daily with breakfast. 90 tablet 3  . glucose blood test strip And lancets 1/day 250.00 100 each 12  . lisinopril (PRINIVIL,ZESTRIL) 5 MG tablet TAKE 1 TABLET EACH DAY. 30 tablet 2  . metFORMIN (GLUCOPHAGE) 500 MG tablet Take 1 tablet (500 mg total) by mouth 2 (two) times daily with a meal. 180 tablet 3  . pravastatin (PRAVACHOL) 40 MG tablet Take 1 tablet (40 mg total) by mouth daily. 90 tablet 3  . testosterone (ANDROGEL) 50 MG/5GM (1%) GEL Place 5 g onto the skin daily. 450 g 1   No  current facility-administered medications on file prior to visit.    Review of Systems   Constitutional: Negative for other unusual diaphoresis or sweats HENT: Negative for ear discharge or swelling Eyes: Negative for other worsening visual disturbances Respiratory: Negative for stridor or other swelling  Gastrointestinal: Negative for worsening distension or other blood Genitourinary: Negative for retention or other urinary change Musculoskeletal: Negative for other MSK pain or swelling Skin: Negative for color change or other new lesions Neurological: Negative for worsening tremors and other numbness  Psychiatric/Behavioral: Negative for worsening agitation or other fatigue All otherwise neg per pt    Objective:   Physical Exam BP 120/80 (BP Location: Left Arm, Patient Position: Sitting, Cuff Size: Large)   Pulse 86   Temp 98.5 F (36.9 C) (Oral)   Ht 6\' 3"  (1.905 m)   Wt 224 lb 8 oz (101.8 kg)   SpO2 96%   BMI 28.06 kg/m  VS noted, not ill appearing Constitutional: Pt appears in NAD HENT: Head: NCAT.  Right Ear: External ear normal.  Left Ear: External ear normal.  Eyes: . Pupils are equal, round, and reactive to light. Conjunctivae and EOM are normal Nose: without d/c or deformity Neck: Neck supple. Gross normal ROM Cardiovascular: Normal rate and regular rhythm.   Pulmonary/Chest: Effort normal and breath sounds without rales or wheezing.  Right hand with  mild bony CMC degenerative change and non discrete vague swelling at the right thenar eminence with tender area at the [facility], also has reduced ROM right thumb, o/w hand neurovasc intact Left hand and thumb without significant abnormality and FROM Neurological: Pt is alert. At baseline orientation, motor grossly intact Skin: Skin is warm. No rashes, other new lesions, no LE edema Psychiatric: Pt behavior is normal without agitation  No other exam findings    Assessment & Plan:

## 2017-09-28 NOTE — Assessment & Plan Note (Signed)
stable overall by history and exam, recent data reviewed with pt, and pt to continue medical treatment as before,  to f/u any worsening symptoms or concerns Lab Results  Component Value Date   HGBA1C 6.6 (H) 07/18/2017

## 2017-09-28 NOTE — Assessment & Plan Note (Signed)
I suspect related to [facility] djd vs tendonitis, mild, for xray, mobic prn, and refer hand surgeon

## 2017-10-09 ENCOUNTER — Encounter (INDEPENDENT_AMBULATORY_CARE_PROVIDER_SITE_OTHER): Payer: Self-pay | Admitting: Orthopaedic Surgery

## 2017-10-09 ENCOUNTER — Ambulatory Visit (INDEPENDENT_AMBULATORY_CARE_PROVIDER_SITE_OTHER): Payer: BLUE CROSS/BLUE SHIELD | Admitting: Orthopaedic Surgery

## 2017-10-09 ENCOUNTER — Ambulatory Visit (INDEPENDENT_AMBULATORY_CARE_PROVIDER_SITE_OTHER): Payer: Self-pay

## 2017-10-09 DIAGNOSIS — M65311 Trigger thumb, right thumb: Secondary | ICD-10-CM

## 2017-10-09 DIAGNOSIS — M79641 Pain in right hand: Secondary | ICD-10-CM

## 2017-10-09 HISTORY — DX: Pain in right hand: M79.641

## 2017-10-09 MED ORDER — DICLOFENAC SODIUM 1 % TD GEL: 2.0000 g | Freq: Four times a day (QID) | TRANSDERMAL | 2 refills | Status: DC

## 2017-10-09 NOTE — Progress Notes (Signed)
Office Visit Note   Patient: William Sheppard           Date of Birth: 04/21/1970           MRN: [id number] Visit Date: 10/09/2017              Requested by: Biagio Borg, MD [place], [place] [address] PCP: Biagio Borg, MD   Assessment & Plan: Visit Diagnoses:  1. Trigger thumb of right hand     Plan: Impression is early right trigger thumb.  Today, I proposed injecting this with cortisone.  The patient would like to try topical anti-inflammatories and massage for now.  If he is not any better over the next few weeks he may follow-up for cortisone injection.  Call with concerns or questions in the meantime.  Follow-Up Instructions: Return if symptoms worsen or fail to improve.   Orders:  Orders Placed This Encounter  Procedures  . XR Hand Complete Right   Meds ordered this encounter  Medications  . diclofenac sodium (VOLTAREN) 1 % GEL    Sig: Apply 2 g topically 4 (four) times daily.    Dispense:  1 Tube    Refill:  2      Procedures: No procedures performed   Clinical Data: No additional findings.   Subjective: Chief Complaint  Patient presents with  . Right Thumb - Pain    HPI patient is a pleasant right-hand-dominant 47 year old gentleman who presents to our clinic today with right thumb pain.  This began approximately 3 months ago without any known injury or change in activity.  He does note that he does a lot of lifting for his work, however.  All of his pain is to the thenar eminence as well as the MCP joint.  Pain is worse with thumb extension or really any other use of the thumb.  He is tried over-the-counter medications and as well as meloxicam without relief of symptoms.  He does get mild relief with a heating pad as well as ice pack.  No numbness, tingling or burning.  Review of Systems as detailed in HPI.  All others reviewed and are negative.   Objective: Vital Signs: There were no vitals taken for this visit.  Physical Exam  well-developed and well-nourished gentleman no acute distress.  Alert and oriented x3.  Ortho Exam examination of his right thumb reveals no tenderness at the [facility] joint.  Moderate tenderness over the A1 pulley/mcp joint.  No palpable nodule.  No active triggering.  No tenderness over the first dorsal compartment and negative grind test.  Full range of motion of the thumb.  No palpable cord.  Neurovascular intact distally.  Specialty Comments:  No specialty comments available.  Imaging: Xr Hand Complete Right  Result Date: 10/09/2017 No acute or structural abnormalities    PMFS History: Patient Active Problem List   Diagnosis Date Noted  . Pain in right hand 10/09/2017  . Pain of right thumb 09/27/2017  . Acute right-sided low back pain without sciatica 12/07/2016  . Rapid palpitations 11/08/2016  . Neck pain on left side 11/08/2016  . Left hip pain 02/28/2016  . Atypical chest pain 12/20/2015  . Dizziness 07/21/2015  . Hodgkin's lymphoma ([place]) 03/11/2015  . Diabetes mellitus type II, non insulin dependent ([place]) 03/11/2015  . Thalassemia, alpha ([place]) 03/11/2015  . Acute sinus infection 01/19/2015  . 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 Low serum testosterone level 11/04/2013  . Numbness 11/03/2012  .  Inadequate sleep hygiene 10/23/2012  . Daytime somnolence 09/18/2012  . Other malaise and fatigue 09/18/2012  . Mid back pain on left side 05/31/2012  . Bilateral foot pain 07/27/2011  . Hyperlipidemia   . Encounter for well adult exam with abnormal findings 05/05/2011   Past Medical History:  Diagnosis Date  . Chronic headaches   . Diabetes mellitus   . Hodgkin's lymphoma ([place])   . Hyperlipidemia     Family History  Problem Relation Age of Onset  . Diabetes Father     Past Surgical History:  Procedure Laterality Date  . [place]  . WISDOM TOOTH EXTRACTION     Social History   Occupational History  . Occupation: Lobbyist: [employer]  Tobacco Use  . Smoking status: Never Smoker  . Smokeless tobacco: Never Used  Substance and Sexual Activity  . Alcohol use: Yes    Comment: social  . Drug use: No  . Sexual activity: Not on file

## 2017-11-01 ENCOUNTER — Other Ambulatory Visit: Payer: Self-pay | Admitting: Internal Medicine

## 2017-12-07 ENCOUNTER — Other Ambulatory Visit: Payer: Self-pay | Admitting: Internal Medicine

## 2018-01-06 ENCOUNTER — Other Ambulatory Visit: Payer: Self-pay | Admitting: Internal Medicine

## 2018-02-10 ENCOUNTER — Ambulatory Visit: Payer: Self-pay | Admitting: *Deleted

## 2018-02-10 NOTE — Telephone Encounter (Signed)
  Answer Assessment - Initial Assessment Questions 1. SYMPTOMS: "Do you have any symptoms?"     Viral symptoms- patient states he had viral symptoms which made him sick for several days. Patient symptoms are resolving and he is returning to his normal routine.  2. SEVERITY: If symptoms are present, ask "Are they mild, moderate or severe?"     Better now- patient states he is recovering now. Appointment has been scheduled for 02/21/17- he will call if he gets worse before that appointment.  Protocols used: MEDICATION QUESTION CALL-A-AH

## 2018-02-10 NOTE — Telephone Encounter (Signed)
Pt calling stating that yesterday he noticed his urine is dark yellow Been feeling feverish since Thursday he just got out of bed yesterday he think he may had the flu he has been trying to drink water he's Diabetic and wanted to know if he need to come in   Patient felt like he had virus over the last few days- he has noticed some improvement. From Thursday- Sunday- patient did not take any of his medications.Patient has started taking his medications- and his glucose has returned to a more normal level.  Patient started eating again yesterday- he states he lost 7 lbs.- he was only taking in liquids.  Patient states he does feel better now. Patient is not taking any Tylenol or Advil- he does take ASA. Patient did use vitamin C pack.    Patient did not get a flu shot this year.   Explained to patient how important it is to know what it is safe to take during periods of illness. He does not need to stop medications just because he gets ill. Patient needs to have a plan in place- he needs to have a diet in place.  Patient is going to speak to the pharmacist and an appointment has been made with PCP to review medications and make sure he has recovered from virus. Patient to call back if he should get worse before that appointment.  Patient will monitor his BP and glucose and make sure his numbers return to normal. Patient states he had dark urine 2 times during his illness- he was not eating- on;y drinking and not taking any of his medications.Patient states he is doing better.

## 2018-02-11 ENCOUNTER — Other Ambulatory Visit: Payer: Self-pay | Admitting: Internal Medicine

## 2018-02-11 NOTE — Telephone Encounter (Signed)
Noted  

## 2018-02-21 ENCOUNTER — Other Ambulatory Visit (INDEPENDENT_AMBULATORY_CARE_PROVIDER_SITE_OTHER): Payer: BLUE CROSS/BLUE SHIELD

## 2018-02-21 ENCOUNTER — Encounter: Payer: Self-pay | Admitting: Internal Medicine

## 2018-02-21 ENCOUNTER — Ambulatory Visit (INDEPENDENT_AMBULATORY_CARE_PROVIDER_SITE_OTHER): Payer: BLUE CROSS/BLUE SHIELD | Admitting: Internal Medicine

## 2018-02-21 VITALS — BP 114/82 | HR 107 | Temp 98.3°F | Ht 75.0 in | Wt 228.0 lb

## 2018-02-21 DIAGNOSIS — J069 Acute upper respiratory infection, unspecified: Secondary | ICD-10-CM

## 2018-02-21 DIAGNOSIS — E119 Type 2 diabetes mellitus without complications: Secondary | ICD-10-CM

## 2018-02-21 DIAGNOSIS — E782 Mixed hyperlipidemia: Secondary | ICD-10-CM | POA: Diagnosis not present

## 2018-02-21 DIAGNOSIS — Z0000 Encounter for general adult medical examination without abnormal findings: Secondary | ICD-10-CM | POA: Diagnosis not present

## 2018-02-21 LAB — LIPID PANEL
Cholesterol: 128 mg/dL (ref 0–200)
HDL: 42.4 mg/dL (ref >39.00)
LDL Cholesterol: 59 mg/dL (ref 0–99)
NONHDL: 85.75
Total CHOL/HDL Ratio: 3
Triglycerides: 134 mg/dL (ref 0.0–149.0)
VLDL: 26.8 mg/dL (ref 0.0–40.0)

## 2018-02-21 LAB — HEPATIC FUNCTION PANEL
ALT: 41 U/L (ref 0–53)
AST: 18 U/L (ref 0–37)
Albumin: 4.2 g/dL (ref 3.5–5.2)
Alkaline Phosphatase: 43 U/L (ref 39–117)
BILIRUBIN TOTAL: 0.3 mg/dL (ref 0.2–1.2)
Bilirubin, Direct: 0.1 mg/dL (ref 0.0–0.3)
Total Protein: 7.3 g/dL (ref 6.0–8.3)

## 2018-02-21 LAB — HEMOGLOBIN A1C: Hgb A1c MFr Bld: 8.1 % — ABNORMAL HIGH (ref 4.6–6.5)

## 2018-02-21 LAB — BASIC METABOLIC PANEL
BUN: 18 mg/dL (ref 6–23)
CO2: 27 mEq/L (ref 19–32)
Calcium: 9.3 mg/dL (ref 8.4–10.5)
Chloride: 105 mEq/L (ref 96–112)
Creatinine, Ser: 1.13 mg/dL (ref 0.40–1.50)
GFR: 89.76 mL/min (ref >60.00)
Glucose, Bld: 217 mg/dL — ABNORMAL HIGH (ref 70–99)
Potassium: 3.9 mEq/L (ref 3.5–5.1)
Sodium: 139 mEq/L (ref 135–145)

## 2018-02-21 NOTE — Patient Instructions (Addendum)
Please ask if your Insurance will cover a "Screening Colonoscopy" at your age.  If so, please let us know, and I will order  Please continue all other medications as before, and refills have been done if requested.  Please have the pharmacy call with any other refills you may need.  Please continue your efforts at being more active, low cholesterol diet, and weight control.  Please keep your appointments with your specialists as you may have planned  Please go to the LAB in the Basement (turn left off the elevator) for the tests to be done today  You will be contacted by phone if any changes need to be made immediately.  Otherwise, you will receive a letter about your results with an explanation, but please check with MyChart first.  Please remember to sign up for MyChart if you have not done so, as this will be important to you in the future with finding out test results, communicating by private email, and scheduling acute appointments online when needed.  Please return in 6 months, or sooner if needed, with Lab testing done 3-5 days before

## 2018-02-21 NOTE — Assessment & Plan Note (Signed)
Near resolved, d/w pt, reassured, likely viral, no specific tx at this time

## 2018-02-21 NOTE — Assessment & Plan Note (Signed)
stable overall by history and exam, recent data reviewed with pt, and pt to continue medical treatment as before,  to f/u any worsening symptoms or concerns  

## 2018-02-21 NOTE — Progress Notes (Signed)
Subjective:    Patient ID: William Sheppard, male    DOB: Feb 23, 1970, 48 y.o.   MRN: [id number]  HPI  Here to f/u; overall doing ok,  Pt denies chest pain, increasing sob or doe, wheezing, orthopnea, PND, increased LE swelling, palpitations, dizziness or syncope.  Pt denies new neurological symptoms such as new headache, or facial or extremity weakness or numbness.  Pt denies polydipsia, polyuria, or low sugar episode.  Pt states overall good compliance with meds, mostly trying to follow appropriate diet, with wt overall stable,  but little exercise however. Also C/o upper resp like illness since xmas with fever, alka seltzer helped, did not take his home meds for 2 days at the worst point trying to avoid med interactions, but taking last few days.  Still has clear scant prod cough, but no wheezing or sob.   Past Medical History:  Diagnosis Date  . Chronic headaches   . Diabetes mellitus   . Hodgkin's lymphoma ([place])   . Hyperlipidemia    Past Surgical History:  Procedure Laterality Date  . [place]  . WISDOM TOOTH EXTRACTION      reports that he has never smoked. He has never used smokeless tobacco. He reports current alcohol use. He reports that he does not use drugs. family history includes Diabetes in his father. No Known Allergies Current Outpatient Medications on File Prior to Visit  Medication Sig Dispense Refill  . aspirin 81 MG EC tablet Take 1 tablet (81 mg total) by mouth daily. Swallow whole. 30 tablet 12  . diclofenac sodium (VOLTAREN) 1 % GEL Apply 2 g topically 4 (four) times daily. 1 Tube 2  . glipiZIDE (GLUCOTROL XL) 5 MG 24 hr tablet TAKE 1 TABLET BY MOUTH DAILY WITH BREAKFAST. 30 tablet 0  . glucose blood test strip And lancets 1/day 250.00 100 each 12  . lisinopril (PRINIVIL,ZESTRIL) 5 MG tablet TAKE 1 TABLET EACH DAY. 30 tablet 0  . meloxicam (MOBIC) 15 MG tablet Take 1 tablet (15 mg total) by mouth daily as needed for pain. 30 tablet 2  . metFORMIN  (GLUCOPHAGE) 500 MG tablet TAKE 1 TABLET BY MOUTH TWICE DAILY WITH A MEAL. 60 tablet 0  . pravastatin (PRAVACHOL) 40 MG tablet TAKE 1 TABLET EACH DAY. 30 tablet 0  . testosterone (ANDROGEL) 50 MG/5GM (1%) GEL Place 5 g onto the skin daily. 450 g 1   No current facility-administered medications on file prior to visit.    Review of Systems  Constitutional: Negative for other unusual diaphoresis or sweats HENT: Negative for ear discharge or swelling Eyes: Negative for other worsening visual disturbances Respiratory: Negative for stridor or other swelling  Gastrointestinal: Negative for worsening distension or other blood Genitourinary: Negative for retention or other urinary change Musculoskeletal: Negative for other MSK pain or swelling Skin: Negative for color change or other new lesions Neurological: Negative for worsening tremors and other numbness  Psychiatric/Behavioral: Negative for worsening agitation or other fatigue All other system neg per pt    Objective:   Physical Exam BP 114/82   Pulse (!) 107   Temp 98.3 F (36.8 C) (Oral)   Ht 6\' 3"  (1.905 m)   Wt 228 lb (103.4 kg)   SpO2 93%   BMI 28.50 kg/m  VS noted,  Constitutional: Pt appears in NAD HENT: Head: NCAT.  Right Ear: External ear normal.  Left Ear: External ear normal.  Eyes: . Pupils are equal, round, and reactive to light. Conjunctivae  and EOM are normal Nose: without d/c or deformity Neck: Neck supple. Gross normal ROM Cardiovascular: Normal rate and regular rhythm.   Pulmonary/Chest: Effort normal and breath sounds without rales or wheezing.  Abd:  Soft, NT, ND, + BS, no organomegaly Neurological: Pt is alert. At baseline orientation, motor grossly intact Skin: Skin is warm. No rashes, other new lesions, no LE edema Psychiatric: Pt behavior is normal without agitation  No other exam findings Lab Results  Component Value Date   WBC 4.5 07/18/2017   HGB 12.8 (L) 07/18/2017   HCT 40.3 07/18/2017   PLT  223.0 07/18/2017   GLUCOSE 217 (H) 02/21/2018   CHOL 128 02/21/2018   TRIG 134.0 02/21/2018   HDL 42.40 02/21/2018   LDLDIRECT 83.0 07/22/2014   LDLCALC 59 02/21/2018   ALT 41 02/21/2018   AST 18 02/21/2018   NA 139 02/21/2018   K 3.9 02/21/2018   CL 105 02/21/2018   CREATININE 1.13 02/21/2018   BUN 18 02/21/2018   CO2 27 02/21/2018   TSH 1.15 07/18/2017   PSA 0.87 07/18/2017   HGBA1C 8.1 (H) 02/21/2018   MICROALBUR 11.3 (H) 07/18/2017       Assessment & Plan:

## 2018-02-22 ENCOUNTER — Other Ambulatory Visit: Payer: Self-pay | Admitting: Internal Medicine

## 2018-02-22 MED ORDER — METFORMIN HCL ER 500 MG PO TB24: 1500.0000 mg | ORAL_TABLET | Freq: Every day | ORAL | 3 refills | Status: DC

## 2018-02-24 ENCOUNTER — Telehealth: Payer: Self-pay

## 2018-02-24 NOTE — Telephone Encounter (Signed)
Pt has been informed of results and expressed understanding.  °

## 2018-02-24 NOTE — Telephone Encounter (Signed)
-----   Message from Biagio Borg, MD sent at 02/22/2018  3:08 PM EST ----- Left message on MyChart, pt to cont same tx except  The test results show that your current treatment is OK, except the A1c is mildly elevated.  We should change the metformin to a total of 1500 mg per day, which can be done by taking 3 of the metformin ER 500 mg (the long acting kind) all in the AM.  Please continue to follow a diabetic diet and regular excercise.    Priscila Bean to please inform pt, I will do rx

## 2018-02-28 ENCOUNTER — Ambulatory Visit: Payer: Self-pay

## 2018-02-28 MED ORDER — METFORMIN HCL ER 500 MG PO TB24: 1500.0000 mg | ORAL_TABLET | Freq: Every day | ORAL | 3 refills | Status: DC

## 2018-02-28 MED ORDER — GLIPIZIDE ER 10 MG PO TB24: 10.0000 mg | ORAL_TABLET | Freq: Every day | ORAL | 3 refills | Status: DC

## 2018-02-28 NOTE — Addendum Note (Signed)
Addended by: Biagio Borg on: 02/28/2018 12:51 PM   Modules accepted: Orders

## 2018-02-28 NOTE — Telephone Encounter (Signed)
Ok to decrease the metformin back to 2 pills per day, and increase the glipizide ER to 10 mg per day - done erx

## 2018-02-28 NOTE — Telephone Encounter (Signed)
  Pt. Reports he has increased his Metformin as ordered, and has developed problems : diarrhea ,itchy rash to groin, and has had some shortness of breath while in bed at night. Pt. Was in a meeting and could not speak very long. Please advise pt. Answer Assessment - Initial Assessment Questions 1. SYMPTOMS: "Do you have any symptoms?"     Diarrhea,shortness of breath at night at times, itchy rash to groin. 2. SEVERITY: If symptoms are present, ask "Are they mild, moderate or severe?"     Severe  Protocols used: MEDICATION QUESTION CALL-A-AH

## 2018-02-28 NOTE — Telephone Encounter (Signed)
Pt has been informed and expressed understanding.  

## 2018-03-03 ENCOUNTER — Encounter: Payer: Self-pay | Admitting: Internal Medicine

## 2018-03-03 DIAGNOSIS — E119 Type 2 diabetes mellitus without complications: Secondary | ICD-10-CM

## 2018-03-18 ENCOUNTER — Other Ambulatory Visit: Payer: Self-pay | Admitting: Internal Medicine

## 2018-03-26 ENCOUNTER — Encounter: Payer: Self-pay | Admitting: Internal Medicine

## 2018-03-26 MED ORDER — ALPRAZOLAM 1 MG PO TABS: 1.0000 mg | ORAL_TABLET | Freq: Four times a day (QID) | ORAL | 0 refills | Status: DC | PRN

## 2018-03-26 NOTE — Telephone Encounter (Signed)
Done erx 

## 2018-04-01 ENCOUNTER — Telehealth: Payer: Self-pay | Admitting: Endocrinology

## 2018-04-01 ENCOUNTER — Encounter: Payer: Self-pay | Admitting: Endocrinology

## 2018-04-01 ENCOUNTER — Ambulatory Visit: Payer: BLUE CROSS/BLUE SHIELD | Admitting: Endocrinology

## 2018-04-01 VITALS — BP 116/78 | HR 82 | Ht 75.0 in | Wt 226.2 lb

## 2018-04-01 DIAGNOSIS — E119 Type 2 diabetes mellitus without complications: Secondary | ICD-10-CM | POA: Diagnosis not present

## 2018-04-01 MED ORDER — LINAGLIPTIN 5 MG PO TABS: 5.0000 mg | ORAL_TABLET | Freq: Every day | ORAL | 11 refills | Status: DC

## 2018-04-01 MED ORDER — GLIPIZIDE ER 2.5 MG PO TB24: 2.5000 mg | ORAL_TABLET | Freq: Every day | ORAL | 2 refills | Status: DC

## 2018-04-01 MED ORDER — METFORMIN HCL ER 500 MG PO TB24: 500.0000 mg | ORAL_TABLET | Freq: Every day | ORAL | 3 refills | Status: DC

## 2018-04-01 NOTE — Telephone Encounter (Signed)
Please refer to pt request 

## 2018-04-01 NOTE — Progress Notes (Signed)
Subjective:    Patient ID: William Sheppard, male    DOB: 01-Jul-1970, 48 y.o.   MRN: [id number]  HPI pt is referred by Dr Jenny Reichmann, for diabetes.  Pt states DM was dx'ed in 2013; he has mild if any neuropathy of the lower extremities; he is unaware of any associated chronic complications; he has never been on insulin; pt says his diet and exercise are good; he has never had pancreatitis, pancreatic surgery, severe hypoglycemia or DKA..  . He brings a record of his cbg's which I have reviewed today.  cbg varies from 117-155.   He takes 2 oral meds, but he takes metformin just 500 mg qd, due to dysuria.   Past Medical History:  Diagnosis Date  . Chronic headaches   . Diabetes mellitus   . Hodgkin's lymphoma ([place])   . Hyperlipidemia     Past Surgical History:  Procedure Laterality Date  . [place]  . WISDOM TOOTH EXTRACTION      Social History   Socioeconomic History  . Marital status: Married    Spouse name: Not on file  . Number of children: Not on file  . Years of education: masters  . Highest education level: Not on file  Occupational History  . Occupation: Lobbyist: [employer]  . Financial resource strain: Not on file  . Food insecurity:    Worry: Not on file    Inability: Not on file  . Transportation needs:    Medical: Not on file    Non-medical: Not on file  Tobacco Use  . Smoking status: Never Smoker  . Smokeless tobacco: Never Used  Substance and Sexual Activity  . Alcohol use: Yes    Comment: social  . Drug use: No  . Sexual activity: Not on file  Lifestyle  . Physical activity:    Days per week: Not on file    Minutes per session: Not on file  . Stress: Not on file  Relationships  . Social connections:    Talks on phone: Not on file    Gets together: Not on file    Attends religious service: Not on file    Active member of club or organization: Not on file    Attends meetings of clubs or organizations:  Not on file    Relationship status: Not on file  . Intimate partner violence:    Fear of current or ex partner: Not on file    Emotionally abused: Not on file    Physically abused: Not on file    Forced sexual activity: Not on file  Other Topics Concern  . Not on file  Social History Narrative  . Not on file    Current Outpatient Medications on File Prior to Visit  Medication Sig Dispense Refill  . ALPRAZolam (XANAX) 1 MG tablet Take 1 tablet (1 mg total) by mouth 4 (four) times daily as needed for anxiety. 20 tablet 0  . aspirin 81 MG EC tablet Take 1 tablet (81 mg total) by mouth daily. Swallow whole. 30 tablet 12  . cholecalciferol (VITAMIN D3) 25 MCG (1000 UT) tablet Take 1,000 Units by mouth daily.    . diclofenac sodium (VOLTAREN) 1 % GEL Apply 2 g topically 4 (four) times daily. 1 Tube 2  . glucose blood test strip And lancets 1/day 250.00 100 each 12  . lisinopril (PRINIVIL,ZESTRIL) 5 MG tablet TAKE 1 TABLET EACH DAY. 30 tablet 0  .  meloxicam (MOBIC) 15 MG tablet Take 1 tablet (15 mg total) by mouth daily as needed for pain. 30 tablet 2  . pravastatin (PRAVACHOL) 40 MG tablet TAKE 1 TABLET EACH DAY. 30 tablet 0  . testosterone (ANDROGEL) 50 MG/5GM (1%) GEL Place 5 g onto the skin daily. 450 g 1   No current facility-administered medications on file prior to visit.     No Known Allergies  Family History  Problem Relation Age of Onset  . Diabetes Father     BP 116/78 (BP Location: Right Arm, Patient Position: Sitting, Cuff Size: Normal)   Pulse 82   Ht 6\' 3"  (1.905 m)   Wt 226 lb 3.2 oz (102.6 kg)   SpO2 94%   BMI 28.27 kg/m    Review of Systems denies weight loss, blurry vision, chest pain, sob, n/v, nocturia, muscle cramps, excessive diaphoresis, memory loss, depression, cold intolerance, rhinorrhea, and easy bruising.  He has intermitt headache.      Objective:   Physical Exam VS: see vs page GEN: no distress HEAD: head: no deformity eyes: no periorbital  swelling, no proptosis external nose and ears are normal mouth: no lesion seen NECK: supple, thyroid is not enlarged CHEST WALL: no deformity LUNGS: clear to auscultation CV: reg rate and rhythm, no murmur ABD: abdomen is soft, nontender.  no hepatosplenomegaly.  not distended.  no hernia MUSCULOSKELETAL: muscle bulk and strength are grossly normal.  no obvious joint swelling.  gait is normal and steady EXTEMITIES: no deformity.  no ulcer on the feet.  feet are of normal color and temp.  no edema PULSES: dorsalis pedis intact bilat.  no carotid bruit NEURO:  cn 2-12 grossly intact.   readily moves all 4's.  sensation is intact to touch on the feet SKIN:  Normal texture and temperature.  No rash or suspicious lesion is visible.   NODES:  None palpable at the neck PSYCH: alert, well-oriented.  Does not appear anxious nor depressed.   Lab Results  Component Value Date   HGBA1C 8.1 (H) 02/21/2018   I have reviewed outside records, and summarized: Pt was noted to have elevated a1c, and referred here.  He was seen in ER for nonketotic hyperosmolar hyperglycemic state in 2016.  No cause was found      Assessment & Plan:  Type 2 DM: he needs increased rx.  We discussed a goal of minimizing or stopping glipizide.  Dysuria, new.  Uncertain if this is related to metformin, but we'll keep dosage at 500 mg for now.  .  Lean body habitus.  We discussed risk for evolving type 1.   Patient Instructions  good diet and exercise significantly improve the control of your diabetes.  please let me know if you wish to be referred to a dietician.  high blood sugar is very risky to your health.  you should see an eye doctor and dentist every year.  It is very important to get all recommended vaccinations.  Controlling your blood pressure and cholesterol drastically reduces the damage diabetes does to your body.  Those who smoke should quit.  Please discuss these with your doctor.  check your blood sugar  once a day.  vary the time of day when you check, between before the 3 meals, and at bedtime.  also check if you have symptoms of your blood sugar being too high or too low.  please keep a record of the readings and bring it to your next appointment here (or  you can bring the meter itself).  You can write it on any piece of paper.  please call us sooner if your blood sugar goes below 70, or if you have a lot of readings over 200. Please continue the same metformin, and: I have sent a prescription to your pharmacy, to add "tradjenta," and: Reduce the glipizide. Please come back for a follow-up appointment in 6 weeks.

## 2018-04-01 NOTE — Telephone Encounter (Signed)
done

## 2018-04-01 NOTE — Telephone Encounter (Signed)
Re-routing to Laura

## 2018-04-01 NOTE — Patient Instructions (Addendum)
good diet and exercise significantly improve the control of your diabetes.  please let me know if you wish to be referred to a dietician.  high blood sugar is very risky to your health.  you should see an eye doctor and dentist every year.  It is very important to get all recommended vaccinations.  Controlling your blood pressure and cholesterol drastically reduces the damage diabetes does to your body.  Those who smoke should quit.  Please discuss these with your doctor.  check your blood sugar once a day.  vary the time of day when you check, between before the 3 meals, and at bedtime.  also check if you have symptoms of your blood sugar being too high or too low.  please keep a record of the readings and bring it to your next appointment here (or you can bring the meter itself).  You can write it on any piece of paper.  please call us sooner if your blood sugar goes below 70, or if you have a lot of readings over 200. Please continue the same metformin, and: I have sent a prescription to your pharmacy, to add "tradjenta," and: Reduce the glipizide. Please come back for a follow-up appointment in 6 weeks.

## 2018-04-01 NOTE — Telephone Encounter (Signed)
Patient requests to be referred to a nutritionist. Please call patient at ph# [phone] to advise.

## 2018-04-15 ENCOUNTER — Encounter: Payer: BLUE CROSS/BLUE SHIELD | Attending: Endocrinology | Admitting: Dietician

## 2018-04-15 ENCOUNTER — Encounter: Payer: Self-pay | Admitting: Dietician

## 2018-04-15 DIAGNOSIS — E119 Type 2 diabetes mellitus without complications: Secondary | ICD-10-CM | POA: Diagnosis not present

## 2018-04-15 NOTE — Progress Notes (Signed)
Diabetes Self-Management Education  Visit Type: First/Initial  Appt. Start Time: 1410 Appt. End Time: 2979  04/16/2018  [name], identified by name and date of birth, is a 48 y.o. male with a diagnosis of Diabetes: Type 2.   ASSESSMENT History of type 2 diabetes since 2013 with concerns of developing type 1 diabetes.  A1C 8.1% 02/2018 increased from 6.6% 07/16/2017.  Patient states that this is due to increased sweets during the holidays and regular soft drinks.   Other history includes dysuria, neuropathy, Hodgkin's Lymphoma 2003, HLD. Weight today 226 lbs (219 lbs this am at home) He uses a [employer] BG meter  Medications include:  Metforman, Tradjenta, and glipizide.  His is not taking the Tradjenta currently as he states that he does not feel well afterward and states that he is very sensitive to medication since his cancer treatment.  Patient lives with his wife.  They share shopping and cooking.  They eat out often.  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  Eating fewer processed foods.  Decreased his sweet intake and uses a small amount of dark chocolate rather than other sweets most often.  Drinks mostly water and has decreased his regular soda intake to 2-5 cans in the past month. His wife has a history of pancreatic cancer and heart surgery.   Height 6\' 3"  (1.905 m), weight 226 lb (102.5 kg). Body mass index is 28.25 kg/m.  Diabetes Self-Management Education - 04/15/18 1426      Visit Information   Visit Type  First/Initial      Initial Visit   Diabetes Type  Type 2    Are you currently following a meal plan?  No    Are you taking your medications as prescribed?  Yes    Date Diagnosed  2013      Health Coping   How would you rate your overall health?  Excellent      Psychosocial Assessment   Patient Belief/Attitude about Diabetes  Other (comment)   ready to get rid of it    Self-care barriers  None    Self-management support  Doctor's office;Family    Other persons present  Patient    Patient Concerns  Nutrition/Meal planning;Weight Control;Glycemic Control;Healthy Lifestyle    Special Needs  None    Preferred Learning Style  No preference indicated    How often do you need to have someone help you when you read instructions, pamphlets, or other written materials from your doctor or pharmacy?  1 - Never    What is the last grade level you completed in school?  Master's degree      Pre-Education Assessment   Patient understands the diabetes disease and treatment process.  Needs Review    Patient understands incorporating nutritional management into lifestyle.  Needs Review    Patient undertands incorporating physical activity into lifestyle.  Needs Review    Patient understands using medications safely.  Needs Review    Patient understands monitoring blood glucose, interpreting and using results  Needs Review    Patient understands prevention, detection, and treatment of acute complications.  Needs Review    Patient understands prevention, detection, and treatment of chronic complications.  Needs Review    Patient understands how to develop strategies to address psychosocial issues.  Needs Review    Patient understands how to develop strategies to promote health/change behavior.  Needs Review  Complications   Last HgB A1C per patient/outside source  8.1 %   02/21/2018 increased from 6.6% 07/18/2017   How often do you check your blood sugar?  3-4 times/day    Fasting Blood glucose range (mg/dL)  70-129    Postprandial Blood glucose range (mg/dL)  180-200;130-179    Number of hypoglycemic episodes per month  0    Number of hyperglycemic episodes per week  1    Can you tell when your blood sugar is high?  No    Have you had a dilated eye exam in the past 12 months?  No   Has an appointment this week   Have you had a dental exam in the past 12 months?  Yes     Are you checking your feet?  Yes    How many days per week are you checking your feet?  7      Dietary Intake   Breakfast  sausage, eggs, cheese (almost daily)    Snack (morning)  vanilla wafers, dark chocolate, fresh fruit    Lunch  hamburger, cheese on bun, fries    Snack (afternoon)  vanilla wafers, dark chocolate, fresh fruit, chips    Dinner  salad with grilled chicken or other meat (plain) or wraps or fish, potatoes or pasta fetucini with spinach, garlic butter sauce    Snack (evening)  almonds or other nut    Beverage(s)  water, rare regular soda, rare regular sweetened green tea, occasional plain hot tea (black, green), smoothie (kale, blueberries, tumeric, honey, coconut water)      Exercise   Exercise Type  Light (walking / raking leaves)   10,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  5    How many minutes per day do you exercise?  480    Total minutes per week of exercise  2400      Patient Education   Previous Diabetes Education  No    Disease state   Definition of diabetes, type 1 and 2, and the diagnosis of diabetes    Nutrition management   Role of diet in the treatment of diabetes and the relationship between the three main macronutrients and blood glucose level;Food label reading, portion sizes and measuring food.;Information on hints to eating out and maintain blood glucose control.;Effects of alcohol on blood glucose and safety factors with consumption of alcohol.;Meal options for control of blood glucose level and chronic complications.;Meal timing in regards to the patients' current diabetes medication.;Carbohydrate counting    Physical activity and exercise   Role of exercise on diabetes management, blood pressure control and cardiac health.    Medications  Reviewed patients medication for diabetes, action, purpose, timing of dose and side effects.    Monitoring  Purpose and frequency of SMBG.;Identified appropriate  SMBG and/or A1C goals.;Daily foot exams;Yearly dilated eye exam    Acute complications  Taught treatment of hypoglycemia - the 15 rule.    Chronic complications  Relationship between chronic complications and blood glucose control;Retinopathy and reason for yearly dilated eye exams    Psychosocial adjustment  Role of stress on diabetes;Worked with patient to identify barriers to care and solutions;Identified and addressed patients feelings and concerns about diabetes      Individualized Goals (developed by patient)   Nutrition  General guidelines for healthy choices and portions discussed    Physical Activity  Exercise 5-7 days per week;30 minutes per day  Medications  take my medication as prescribed    Monitoring   test my blood glucose as discussed    Health Coping  discuss diabetes with (comment)      Post-Education Assessment   Patient understands the diabetes disease and treatment process.  Demonstrates understanding / competency    Patient understands incorporating nutritional management into lifestyle.  Demonstrates understanding / competency    Patient undertands incorporating physical activity into lifestyle.  Demonstrates understanding / competency    Patient understands using medications safely.  Demonstrates understanding / competency    Patient understands monitoring blood glucose, interpreting and using results  Demonstrates understanding / competency    Patient understands prevention, detection, and treatment of acute complications.  Demonstrates understanding / competency    Patient understands prevention, detection, and treatment of chronic complications.  Demonstrates understanding / competency    Patient understands how to develop strategies to address psychosocial issues.  Demonstrates understanding / competency    Patient understands how to develop strategies to promote health/change behavior.  Demonstrates understanding / competency      Outcomes   Expected Outcomes   Demonstrated interest in learning. Expect positive outcomes    Future DMSE  PRN    Program Status  Completed       Individualized Plan for Diabetes Self-Management Training:   Learning Objective:  Patient will have a greater understanding of diabetes self-management. Patient education plan is to attend individual and/or group sessions per assessed needs and concerns.   Plan:   Patient Instructions  Continue to be mindful about what you drink.  No carbohydrate beverages are great! Great job on staying active! Consider ways to change your snacks. Consider carbs to add to breakfast. Balance carbohydrates throughout the day. Discuss your medication with your doctor.    Expected Outcomes:  Demonstrated interest in learning. Expect positive outcomes  Education material provided: ADA Diabetes: Your Take Control Guide, Food label handouts, A1C conversion sheet, Meal plan card, My Plate and Snack sheet, Diabetes Resources  If problems or questions, patient to contact team via:  Phone  Future DSME appointment: PRN

## 2018-04-15 NOTE — Patient Instructions (Signed)
Continue to be mindful about what you drink.  No carbohydrate beverages are great! Great job on staying active! Consider ways to change your snacks. Consider carbs to add to breakfast. Balance carbohydrates throughout the day. Discuss your medication with your doctor.

## 2018-04-17 DIAGNOSIS — H40013 Open angle with borderline findings, low risk, bilateral: Secondary | ICD-10-CM | POA: Diagnosis not present

## 2018-04-24 ENCOUNTER — Other Ambulatory Visit: Payer: Self-pay | Admitting: Internal Medicine

## 2018-05-09 ENCOUNTER — Encounter: Payer: Self-pay | Admitting: Endocrinology

## 2018-05-09 NOTE — Telephone Encounter (Signed)
I have spent 7 minutes reviewing pt's chart and coming up with a plan to recommend to patient.  please contact patient: Options: Change tradjenta to another medication, or: Change metformin to non-extended-release I'll see you next week, for your e-visit.

## 2018-05-13 ENCOUNTER — Ambulatory Visit: Payer: BLUE CROSS/BLUE SHIELD | Admitting: Endocrinology

## 2018-05-13 ENCOUNTER — Ambulatory Visit (INDEPENDENT_AMBULATORY_CARE_PROVIDER_SITE_OTHER): Payer: BLUE CROSS/BLUE SHIELD | Admitting: Endocrinology

## 2018-05-13 ENCOUNTER — Other Ambulatory Visit: Payer: Self-pay

## 2018-05-13 DIAGNOSIS — E119 Type 2 diabetes mellitus without complications: Secondary | ICD-10-CM | POA: Diagnosis not present

## 2018-05-13 MED ORDER — DAPAGLIFLOZIN PROPANEDIOL 5 MG PO TABS: 5.0000 mg | ORAL_TABLET | Freq: Every day | ORAL | 11 refills | Status: DC

## 2018-05-13 NOTE — Patient Instructions (Addendum)
check your blood sugar once a day.  vary the time of day when you check, between before the 3 meals, and at bedtime.  also check if you have symptoms of your blood sugar being too high or too low.  please keep a record of the readings and bring it to your next appointment here (or you can bring the meter itself).  You can write it on any piece of paper.  please call us sooner if your blood sugar goes below 70, or if you have a lot of readings over 200. Please continue the same metformin, and: I have sent a prescription to your pharmacy, to change glipizide to "[place]."   Please do blood tests in 1-2 weeks.   Please come back for a follow-up appointment in 2 months.

## 2018-05-13 NOTE — Progress Notes (Addendum)
Subjective:    Patient ID: William Sheppard, male    DOB: [birth year], 48 y.o.   MRN: [id number]  HPI  telehealth visit today via doxy video visit.  Alternatives to telehealth are presented to this patient, and the patient agrees to the telehealth visit. Pt is advised of the cost of the visit, and agrees to this, also.   Patient is at home, and I am at the office.   Pt returns for f/u of diabetes mellitus: DM type: 2 Dx'ed: 9528 Complications: none Therapy: 3 oral meds DKA: never, but he had nonketotic hyperosmolar hyperglycemic state in 2016 Severe hypoglycemia: never Pancreatitis: never Pancreatic imaging: normal on 2000 CT Other: he has never been on insulin; pt says metfgrmin causes dysuria, so we are minimizing dosage.   Interval history: He says cbg's are in the low-100's.  pt states he feels well in general.   Past Medical History:  Diagnosis Date  . Chronic headaches   . Diabetes mellitus   . Hodgkin's lymphoma ([place])   . Hyperlipidemia     Past Surgical History:  Procedure Laterality Date  . [place]  . WISDOM TOOTH EXTRACTION      Social History   Socioeconomic History  . Marital status: Married    Spouse name: Not on file  . Number of children: Not on file  . Years of education: masters  . Highest education level: Not on file  Occupational History  . Occupation: Lobbyist: [employer]  . Financial resource strain: Not on file  . Food insecurity:    Worry: Not on file    Inability: Not on file  . Transportation needs:    Medical: Not on file    Non-medical: Not on file  Tobacco Use  . Smoking status: Never Smoker  . Smokeless tobacco: Never Used  Substance and Sexual Activity  . Alcohol use: Yes    Comment: social  . Drug use: No  . Sexual activity: Not on file  Lifestyle  . Physical activity:    Days per week: Not on file    Minutes per session: Not on file  . Stress: Not on file  Relationships   . Social connections:    Talks on phone: Not on file    Gets together: Not on file    Attends religious service: Not on file    Active member of club or organization: Not on file    Attends meetings of clubs or organizations: Not on file    Relationship status: Not on file  . Intimate partner violence:    Fear of current or ex partner: Not on file    Emotionally abused: Not on file    Physically abused: Not on file    Forced sexual activity: Not on file  Other Topics Concern  . Not on file  Social History Narrative  . Not on file    Current Outpatient Medications on File Prior to Visit  Medication Sig Dispense Refill  . ALPRAZolam (XANAX) 1 MG tablet Take 1 tablet (1 mg total) by mouth 4 (four) times daily as needed for anxiety. 20 tablet 0  . aspirin 81 MG EC tablet Take 1 tablet (81 mg total) by mouth daily. Swallow whole. 30 tablet 12  . cholecalciferol (VITAMIN D3) 25 MCG (1000 UT) tablet Take 1,000 Units by mouth daily.    . diclofenac sodium (VOLTAREN) 1 % GEL Apply 2 g topically 4 (four)  times daily. 1 Tube 2  . glucose blood test strip And lancets 1/day 250.00 100 each 12  . linagliptin (TRADJENTA) 5 MG TABS tablet Take 1 tablet (5 mg total) by mouth daily. 30 tablet 11  . lisinopril (PRINIVIL,ZESTRIL) 5 MG tablet TAKE 1 TABLET EACH DAY. 30 tablet 5  . meloxicam (MOBIC) 15 MG tablet Take 1 tablet (15 mg total) by mouth daily as needed for pain. 30 tablet 2  . metFORMIN (GLUCOPHAGE-XR) 500 MG 24 hr tablet Take 1 tablet (500 mg total) by mouth daily with breakfast. 90 tablet 3  . pravastatin (PRAVACHOL) 40 MG tablet TAKE 1 TABLET EACH DAY. 30 tablet 5  . testosterone (ANDROGEL) 50 MG/5GM (1%) GEL Place 5 g onto the skin daily. 450 g 1   No current facility-administered medications on file prior to visit.     No Known Allergies  Family History  Problem Relation Age of Onset  . Diabetes Father     Review of Systems He denies hypoglycemia and dysuria.  He has lost a  few lbs.      Objective:   Physical Exam   Lab Results  Component Value Date   CREATININE 1.13 02/21/2018   BUN 18 02/21/2018   NA 139 02/21/2018   K 3.9 02/21/2018   CL 105 02/21/2018   CO2 27 02/21/2018      Assessment & Plan:  Type 2 DM: he needs some adjustment in his therapy, as goal is to phase out SU rx if possible   Patient Instructions  check your blood sugar once a day.  vary the time of day when you check, between before the 3 meals, and at bedtime.  also check if you have symptoms of your blood sugar being too high or too low.  please keep a record of the readings and bring it to your next appointment here (or you can bring the meter itself).  You can write it on any piece of paper.  please call us sooner if your blood sugar goes below 70, or if you have a lot of readings over 200. Please continue the same metformin, and: I have sent a prescription to your pharmacy, to change glipizide to "[place]."   Please do blood tests in 1-2 weeks.   Please come back for a follow-up appointment in 2 months.

## 2018-05-14 ENCOUNTER — Other Ambulatory Visit: Payer: Self-pay | Admitting: Internal Medicine

## 2018-05-14 ENCOUNTER — Encounter: Payer: Self-pay | Admitting: Internal Medicine

## 2018-05-14 DIAGNOSIS — E119 Type 2 diabetes mellitus without complications: Secondary | ICD-10-CM

## 2018-06-04 ENCOUNTER — Encounter: Payer: Self-pay | Admitting: Endocrinology

## 2018-06-06 ENCOUNTER — Other Ambulatory Visit (INDEPENDENT_AMBULATORY_CARE_PROVIDER_SITE_OTHER): Payer: BLUE CROSS/BLUE SHIELD

## 2018-06-06 DIAGNOSIS — E119 Type 2 diabetes mellitus without complications: Secondary | ICD-10-CM

## 2018-06-06 LAB — HEMOGLOBIN A1C: Hgb A1c MFr Bld: 8 % — ABNORMAL HIGH (ref 4.6–6.5)

## 2018-06-06 LAB — BASIC METABOLIC PANEL
BUN: 20 mg/dL (ref 6–23)
CO2: 27 mEq/L (ref 19–32)
Calcium: 9.5 mg/dL (ref 8.4–10.5)
Chloride: 106 mEq/L (ref 96–112)
Creatinine, Ser: 1.36 mg/dL (ref 0.40–1.50)
GFR: 68.11 mL/min (ref >60.00)
Glucose, Bld: 182 mg/dL — ABNORMAL HIGH (ref 70–99)
Potassium: 4 mEq/L (ref 3.5–5.1)
Sodium: 140 mEq/L (ref 135–145)

## 2018-06-06 LAB — LIPID PANEL
Cholesterol: 139 mg/dL (ref 0–200)
HDL: 42.1 mg/dL (ref >39.00)
NonHDL: 96.7
Total CHOL/HDL Ratio: 3
Triglycerides: 276 mg/dL — ABNORMAL HIGH (ref 0.0–149.0)
VLDL: 55.2 mg/dL — ABNORMAL HIGH (ref 0.0–40.0)

## 2018-06-06 LAB — LDL CHOLESTEROL, DIRECT: Direct LDL: 72 mg/dL

## 2018-06-13 ENCOUNTER — Telehealth: Payer: Self-pay

## 2018-06-13 NOTE — Telephone Encounter (Signed)
LOV 3/31. Per Dr. Loanne Drilling, pt is needing f/u in 2 months. No current appt noted. LVM requesting returned call.

## 2018-06-18 ENCOUNTER — Encounter: Payer: Self-pay | Admitting: Endocrinology

## 2018-06-19 ENCOUNTER — Encounter: Payer: Self-pay | Admitting: Internal Medicine

## 2018-06-19 NOTE — Telephone Encounter (Signed)
Please advise 

## 2018-06-19 NOTE — Telephone Encounter (Signed)
BP Readings from Last 3 Encounters:  04/01/18 116/78  02/21/18 114/82  09/27/17 120/80

## 2018-08-01 ENCOUNTER — Encounter: Payer: Self-pay | Admitting: Endocrinology

## 2018-08-01 NOTE — Telephone Encounter (Signed)
Please advise 

## 2018-08-22 ENCOUNTER — Encounter: Payer: Self-pay | Admitting: Internal Medicine

## 2018-08-22 ENCOUNTER — Other Ambulatory Visit (INDEPENDENT_AMBULATORY_CARE_PROVIDER_SITE_OTHER): Payer: BC Managed Care – PPO

## 2018-08-22 ENCOUNTER — Ambulatory Visit (INDEPENDENT_AMBULATORY_CARE_PROVIDER_SITE_OTHER): Payer: BC Managed Care – PPO | Admitting: Internal Medicine

## 2018-08-22 ENCOUNTER — Other Ambulatory Visit: Payer: Self-pay

## 2018-08-22 VITALS — BP 126/84 | HR 94 | Temp 98.7°F | Ht 75.0 in | Wt 225.0 lb

## 2018-08-22 DIAGNOSIS — Z125 Encounter for screening for malignant neoplasm of prostate: Secondary | ICD-10-CM

## 2018-08-22 DIAGNOSIS — E538 Deficiency of other specified B group vitamins: Secondary | ICD-10-CM

## 2018-08-22 DIAGNOSIS — Z0000 Encounter for general adult medical examination without abnormal findings: Secondary | ICD-10-CM

## 2018-08-22 DIAGNOSIS — E559 Vitamin D deficiency, unspecified: Secondary | ICD-10-CM

## 2018-08-22 DIAGNOSIS — E119 Type 2 diabetes mellitus without complications: Secondary | ICD-10-CM | POA: Diagnosis not present

## 2018-08-22 DIAGNOSIS — E611 Iron deficiency: Secondary | ICD-10-CM

## 2018-08-22 LAB — URINALYSIS, ROUTINE W REFLEX MICROSCOPIC
Bilirubin Urine: NEGATIVE
Ketones, ur: NEGATIVE
Nitrite: NEGATIVE
Specific Gravity, Urine: >=1.03 — AB (ref 1.000–1.030)
Total Protein, Urine: 30 — AB
Urine Glucose: NEGATIVE
Urobilinogen, UA: 0.2 (ref 0.0–1.0)
pH: 6 (ref 5.0–8.0)

## 2018-08-22 LAB — PSA: PSA: 2.49 ng/mL (ref 0.10–4.00)

## 2018-08-22 LAB — BASIC METABOLIC PANEL
BUN: 15 mg/dL (ref 6–23)
CO2: 28 mEq/L (ref 19–32)
Calcium: 9.6 mg/dL (ref 8.4–10.5)
Chloride: 106 mEq/L (ref 96–112)
Creatinine, Ser: 1.12 mg/dL (ref 0.40–1.50)
GFR: 85.13 mL/min (ref >60.00)
Glucose, Bld: 104 mg/dL — ABNORMAL HIGH (ref 70–99)
Potassium: 4 mEq/L (ref 3.5–5.1)
Sodium: 143 mEq/L (ref 135–145)

## 2018-08-22 LAB — HEPATIC FUNCTION PANEL
ALT: 60 U/L — ABNORMAL HIGH (ref 0–53)
AST: 31 U/L (ref 0–37)
Albumin: 4.8 g/dL (ref 3.5–5.2)
Alkaline Phosphatase: 53 U/L (ref 39–117)
Bilirubin, Direct: 0.1 mg/dL (ref 0.0–0.3)
Total Bilirubin: 0.5 mg/dL (ref 0.2–1.2)
Total Protein: 8 g/dL (ref 6.0–8.3)

## 2018-08-22 LAB — CBC WITH DIFFERENTIAL/PLATELET
Basophils Absolute: 0 10*3/uL (ref 0.0–0.1)
Basophils Relative: 0.7 % (ref 0.0–3.0)
Eosinophils Absolute: 0 10*3/uL (ref 0.0–0.7)
Eosinophils Relative: 1 % (ref 0.0–5.0)
HCT: 40.2 % (ref 39.0–52.0)
Hemoglobin: 12.7 g/dL — ABNORMAL LOW (ref 13.0–17.0)
Lymphocytes Relative: 50.9 % — ABNORMAL HIGH (ref 12.0–46.0)
Lymphs Abs: 1.9 10*3/uL (ref 0.7–4.0)
MCHC: 31.4 g/dL (ref 30.0–36.0)
MCV: 69.5 fl — ABNORMAL LOW (ref 78.0–100.0)
Monocytes Absolute: 0.3 10*3/uL (ref 0.1–1.0)
Monocytes Relative: 8.1 % (ref 3.0–12.0)
Neutro Abs: 1.5 10*3/uL (ref 1.4–7.7)
Neutrophils Relative %: 39.3 % — ABNORMAL LOW (ref 43.0–77.0)
Platelets: 210 10*3/uL (ref 150.0–400.0)
RBC: 5.79 Mil/uL (ref 4.22–5.81)
RDW: 14.7 % (ref 11.5–15.5)
WBC: 3.8 10*3/uL — ABNORMAL LOW (ref 4.0–10.5)

## 2018-08-22 LAB — TSH: TSH: 1.42 u[IU]/mL (ref 0.35–4.50)

## 2018-08-22 LAB — IBC PANEL
Iron: 71 ug/dL (ref 42–165)
Saturation Ratios: 21 % (ref 20.0–50.0)
Transferrin: 242 mg/dL (ref 212.0–360.0)

## 2018-08-22 LAB — VITAMIN B12: Vitamin B-12: 589 pg/mL (ref 211–911)

## 2018-08-22 MED ORDER — FARXIGA 5 MG PO TABS: 5.0000 mg | ORAL_TABLET | Freq: Every day | ORAL | 11 refills | Status: DC

## 2018-08-22 MED ORDER — METFORMIN HCL 500 MG PO TABS: 500.0000 mg | ORAL_TABLET | Freq: Every day | ORAL | 3 refills | Status: DC

## 2018-08-22 NOTE — Progress Notes (Signed)
Subjective:    Patient ID: William Sheppard, male    DOB: 01/26/1971, 48 y.o.   MRN: [id number]  HPI   Here for wellness and f/u;  Overall doing ok;  Pt denies Chest pain, worsening SOB, DOE, wheezing, orthopnea, PND, worsening LE edema, palpitations, dizziness or syncope.  Pt denies neurological change such as new headache, facial or extremity weakness.  Pt denies polydipsia, polyuria, or low sugar symptoms. Pt states overall good compliance with treatment and medications, good tolerability, and has been trying to follow appropriate diet.  Pt denies worsening depressive symptoms, suicidal ideation or panic. No fever, night sweats, wt loss, loss of appetite, or other constitutional symptoms.  Pt states good ability with ADL's, has low fall risk, home safety reviewed and adequate, no other significant changes in hearing or vision, and only occasionally active with exercise.  Not taking the farxiga recently but willing to restart Past Medical History:  Diagnosis Date  . Chronic headaches   . Diabetes mellitus   . Hodgkin's lymphoma ([place])   . Hyperlipidemia    Past Surgical History:  Procedure Laterality Date  . [place]  . WISDOM TOOTH EXTRACTION      reports that he has never smoked. He has never used smokeless tobacco. He reports current alcohol use. He reports that he does not use drugs. family history includes Diabetes in his father. No Known Allergies Current Outpatient Medications on File Prior to Visit  Medication Sig Dispense Refill  . ALPRAZolam (XANAX) 1 MG tablet Take 1 tablet (1 mg total) by mouth 4 (four) times daily as needed for anxiety. 20 tablet 0  . aspirin 81 MG EC tablet Take 1 tablet (81 mg total) by mouth daily. Swallow whole. 30 tablet 12  . cholecalciferol (VITAMIN D3) 25 MCG (1000 UT) tablet Take 1,000 Units by mouth daily.    . diclofenac sodium (VOLTAREN) 1 % GEL Apply 2 g topically 4 (four) times daily. 1 Tube 2  . glucose blood test strip And  lancets 1/day 250.00 100 each 12  . linagliptin (TRADJENTA) 5 MG TABS tablet Take 1 tablet (5 mg total) by mouth daily. 30 tablet 11  . lisinopril (PRINIVIL,ZESTRIL) 5 MG tablet TAKE 1 TABLET EACH DAY. 30 tablet 5  . meloxicam (MOBIC) 15 MG tablet Take 1 tablet (15 mg total) by mouth daily as needed for pain. 30 tablet 2  . pravastatin (PRAVACHOL) 40 MG tablet TAKE 1 TABLET EACH DAY. 30 tablet 5  . testosterone (ANDROGEL) 50 MG/5GM (1%) GEL Place 5 g onto the skin daily. 450 g 1   No current facility-administered medications on file prior to visit.    Review of Systems  Constitutional: Negative for other unusual diaphoresis or sweats HENT: Negative for ear discharge or swelling Eyes: Negative for other worsening visual disturbances Respiratory: Negative for stridor or other swelling  Gastrointestinal: Negative for worsening distension or other blood Genitourinary: Negative for retention or other urinary change Musculoskeletal: Negative for other MSK pain or swelling Skin: Negative for color change or other new lesions Neurological: Negative for worsening tremors and other numbness  Psychiatric/Behavioral: Negative for worsening agitation or other fatigue All other system neg per pt    Objective:   Physical Exam BP 126/84   Pulse 94   Temp 98.7 F (37.1 C) (Oral)   Ht 6\' 3"  (1.905 m)   Wt 225 lb (102.1 kg)   SpO2 95%   BMI 28.12 kg/m  VS noted,  Constitutional: Pt  is oriented to person, place, and time. Appears well-developed and well-nourished, in no significant distress and comfortable Head: Normocephalic and atraumatic  Eyes: Conjunctivae and EOM are normal. Pupils are equal, round, and reactive to light Right Ear: External ear normal without discharge Left Ear: External ear normal without discharge Nose: Nose without discharge or deformity Mouth/Throat: Oropharynx is without other ulcerations and moist  Neck: Normal range of motion. Neck supple. No JVD present. No tracheal  deviation present or significant neck LA or mass Cardiovascular: Normal rate, regular rhythm, normal heart sounds and intact distal pulses.   Pulmonary/Chest: WOB normal and breath sounds without rales or wheezing  Abdominal: Soft. Bowel sounds are normal. NT. No HSM  Musculoskeletal: Normal range of motion. Exhibits no edema Lymphadenopathy: Has no other cervical adenopathy.  Neurological: Pt is alert and oriented to person, place, and time. Pt has normal reflexes. No cranial nerve deficit. Motor grossly intact, Gait intact Skin: Skin is warm and dry. No rash noted or new ulcerations Psychiatric:  Has normal mood and affect. Behavior is normal without agitation No other exam findings Lab Results  Component Value Date   WBC 4.5 07/18/2017   HGB 12.8 (L) 07/18/2017   HCT 40.3 07/18/2017   PLT 223.0 07/18/2017   GLUCOSE 182 (H) 06/06/2018   CHOL 139 06/06/2018   TRIG 276.0 (H) 06/06/2018   HDL 42.10 06/06/2018   LDLDIRECT 72.0 06/06/2018   LDLCALC 59 02/21/2018   ALT 41 02/21/2018   AST 18 02/21/2018   NA 140 06/06/2018   K 4.0 06/06/2018   CL 106 06/06/2018   CREATININE 1.36 06/06/2018   BUN 20 06/06/2018   CO2 27 06/06/2018   TSH 1.15 07/18/2017   PSA 0.87 07/18/2017   HGBA1C 8.0 (H) 06/06/2018   MICROALBUR 11.3 (H) 07/18/2017       Assessment & Plan:

## 2018-08-22 NOTE — Patient Instructions (Addendum)
Ok to restart the farxiga  Please continue all other medications as before, and refills have been done if requested.  Please have the pharmacy call with any other refills you may need.  Please continue your efforts at being more active, low cholesterol diet, and weight control.  You are otherwise up to date with prevention measures today.  Please keep your appointments with your specialists as you may have planned  Please go to the LAB in the Basement (turn left off the elevator) for the tests to be done today  You will be contacted by phone if any changes need to be made immediately.  Otherwise, you will receive a letter about your results with an explanation, but please check with MyChart first.  Please remember to sign up for MyChart if you have not done so, as this will be important to you in the future with finding out test results, communicating by private email, and scheduling acute appointments online when needed.  Please return in 1 year for your yearly visit, or sooner if needed, with Lab testing done 3-5 days before

## 2018-08-22 NOTE — Assessment & Plan Note (Signed)

## 2018-08-22 NOTE — Assessment & Plan Note (Addendum)
stable overall by history and exam, recent data reviewed with pt, and pt to continue medical treatment as before,  to f/u any worsening symptoms or concerns, f/u endo as planned

## 2018-08-25 ENCOUNTER — Telehealth: Payer: Self-pay

## 2018-08-25 ENCOUNTER — Other Ambulatory Visit: Payer: Self-pay | Admitting: Internal Medicine

## 2018-08-25 LAB — VITAMIN D 25 HYDROXY (VIT D DEFICIENCY, FRACTURES): VITD: 21.36 ng/mL — ABNORMAL LOW (ref 30.00–100.00)

## 2018-08-25 MED ORDER — VITAMIN D (ERGOCALCIFEROL) 1.25 MG (50000 UNIT) PO CAPS: 50000.0000 [IU] | ORAL_CAPSULE | ORAL | 0 refills | Status: DC

## 2018-08-25 NOTE — Telephone Encounter (Signed)
-----   Message from Biagio Borg, MD sent at 08/25/2018 10:50 AM EDT ----- Left message on MyChart, pt to cont same tx except  The test results show that your current treatment is OK, except the Vitamin D level is quite low, and the urine has several increased white blood cells, which sometimes means an infection.  I will ask the office to call you to ask if you have urinary symptoms or fever.  Also, Please take Vitamin D 50000 units weekly for 12 weeks, then plan to change to OTC Vitamin D3 at 2000 units per day, indefinitely.Redmond Baseman to please inform pt, I will do rx, and let me know if pt has UTI symptoms or fever

## 2018-08-25 NOTE — Telephone Encounter (Signed)
Pt has viewed results via MyChart  

## 2018-10-07 ENCOUNTER — Other Ambulatory Visit: Payer: Self-pay | Admitting: Internal Medicine

## 2018-10-07 MED ORDER — METFORMIN HCL 500 MG PO TABS: 500.0000 mg | ORAL_TABLET | Freq: Two times a day (BID) | ORAL | 3 refills | Status: DC

## 2018-11-03 ENCOUNTER — Other Ambulatory Visit: Payer: Self-pay | Admitting: Internal Medicine

## 2018-12-08 ENCOUNTER — Other Ambulatory Visit: Payer: Self-pay

## 2018-12-08 ENCOUNTER — Encounter: Payer: Self-pay | Admitting: Internal Medicine

## 2018-12-08 ENCOUNTER — Ambulatory Visit (INDEPENDENT_AMBULATORY_CARE_PROVIDER_SITE_OTHER): Payer: BC Managed Care – PPO | Admitting: Internal Medicine

## 2018-12-08 ENCOUNTER — Other Ambulatory Visit (INDEPENDENT_AMBULATORY_CARE_PROVIDER_SITE_OTHER): Payer: BC Managed Care – PPO

## 2018-12-08 DIAGNOSIS — E782 Mixed hyperlipidemia: Secondary | ICD-10-CM | POA: Diagnosis not present

## 2018-12-08 DIAGNOSIS — E119 Type 2 diabetes mellitus without complications: Secondary | ICD-10-CM

## 2018-12-08 DIAGNOSIS — R109 Unspecified abdominal pain: Secondary | ICD-10-CM

## 2018-12-08 DIAGNOSIS — R10A1 Flank pain, right side: Secondary | ICD-10-CM | POA: Insufficient documentation

## 2018-12-08 HISTORY — DX: Unspecified abdominal pain: R10.9

## 2018-12-08 MED ORDER — CYCLOBENZAPRINE HCL 5 MG PO TABS: 5.0000 mg | ORAL_TABLET | Freq: Three times a day (TID) | ORAL | 1 refills | Status: DC | PRN

## 2018-12-08 NOTE — Progress Notes (Signed)
Subjective:    Patient ID: William Sheppard, male    DOB: 11/15/1970, 48 y.o.   MRN: [id number]  HPI  Here to f/u; overall doing ok,  Pt denies chest pain, increasing sob or doe, wheezing, orthopnea, PND, increased LE swelling, palpitations, dizziness or syncope.  Pt denies new neurological symptoms such as new headache, or facial or extremity weakness or numbness.  Pt denies polydipsia, polyuria, Also c/o low back pain x 2-3 days, moderate to severe, constant, no radiatoin or LE symptoms, worse to inhale, turn and walking, has had no heavy lifting but did wash the car just prior to onset of pain.  Denies urinary symptoms such as dysuria, frequency, urgency, flank pain, hematuria or n/v, fever, chills. Past Medical History:  Diagnosis Date  . Chronic headaches   . Diabetes mellitus   . Hodgkin's lymphoma ([place])   . Hyperlipidemia    Past Surgical History:  Procedure Laterality Date  . [place]  . WISDOM TOOTH EXTRACTION      reports that he has never smoked. He has never used smokeless tobacco. He reports current alcohol use. He reports that he does not use drugs. family history includes Diabetes in his father. No Known Allergies Current Outpatient Medications on File Prior to Visit  Medication Sig Dispense Refill  . ALPRAZolam (XANAX) 1 MG tablet Take 1 tablet (1 mg total) by mouth 4 (four) times daily as needed for anxiety. 20 tablet 0  . aspirin 81 MG EC tablet Take 1 tablet (81 mg total) by mouth daily. Swallow whole. 30 tablet 12  . cholecalciferol (VITAMIN D3) 25 MCG (1000 UT) tablet Take 1,000 Units by mouth daily.    . dapagliflozin propanediol (FARXIGA) 5 MG TABS tablet Take 5 mg by mouth daily. 30 tablet 11  . diclofenac sodium (VOLTAREN) 1 % GEL Apply 2 g topically 4 (four) times daily. 1 Tube 2  . glucose blood test strip And lancets 1/day 250.00 100 each 12  . linagliptin (TRADJENTA) 5 MG TABS tablet Take 1 tablet (5 mg total) by mouth daily. 30 tablet 11  .  lisinopril (ZESTRIL) 5 MG tablet TAKE 1 TABLET EACH DAY. 30 tablet 5  . meloxicam (MOBIC) 15 MG tablet Take 1 tablet (15 mg total) by mouth daily as needed for pain. 30 tablet 2  . metFORMIN (GLUCOPHAGE) 500 MG tablet Take 1 tablet (500 mg total) by mouth 2 (two) times daily with a meal. 180 tablet 3  . pravastatin (PRAVACHOL) 40 MG tablet TAKE 1 TABLET EACH DAY. 30 tablet 5  . testosterone (ANDROGEL) 50 MG/5GM (1%) GEL Place 5 g onto the skin daily. 450 g 1  . Vitamin D, Ergocalciferol, (DRISDOL) 1.25 MG (50000 UT) CAPS capsule Take 1 capsule (50,000 Units total) by mouth every 7 (seven) days. 12 capsule 0   No current facility-administered medications on file prior to visit.    Review of Systems  Constitutional: Negative for other unusual diaphoresis or sweats HENT: Negative for ear discharge or swelling Eyes: Negative for other worsening visual disturbances Respiratory: Negative for stridor or other swelling  Gastrointestinal: Negative for worsening distension or other blood Genitourinary: Negative for retention or other urinary change Musculoskeletal: Negative for other MSK pain or swelling Skin: Negative for color change or other new lesions Neurological: Negative for worsening tremors and other numbness  Psychiatric/Behavioral: Negative for worsening agitation or other fatigue All otherwise neg per pt     Objective:   Physical Exam BP 114/76  Pulse 100   Temp 98.6 F (37 C) (Oral)   Ht 6\' 3"  (1.905 m)   Wt 224 lb (101.6 kg)   SpO2 95%   BMI 28.00 kg/m  VS noted,  Constitutional: Pt appears in NAD HENT: Head: NCAT.  Right Ear: External ear normal.  Left Ear: External ear normal.  Eyes: . Pupils are equal, round, and reactive to light. Conjunctivae and EOM are normal Nose: without d/c or deformity Neck: Neck supple. Gross normal ROM Cardiovascular: Normal rate and regular rhythm.   Pulmonary/Chest: Effort normal and breath sounds without rales or wheezing.  Abd:   Soft, NT, ND, + BS, no organomegaly Spine nontender but has bilat left < right lumbar paravertebral tender spasm Neurological: Pt is alert. At baseline orientation, motor grossly intact Skin: Skin is warm. No rashes, other new lesions, no LE edema Psychiatric: Pt behavior is normal without agitation  All otherwise neg per pt Lab Results  Component Value Date   WBC 3.8 (L) 08/22/2018   HGB 12.7 (L) 08/22/2018   HCT 40.2 08/22/2018   PLT 210.0 08/22/2018   GLUCOSE 104 (H) 08/22/2018   CHOL 139 06/06/2018   TRIG 276.0 (H) 06/06/2018   HDL 42.10 06/06/2018   LDLDIRECT 72.0 06/06/2018   LDLCALC 59 02/21/2018   ALT 60 (H) 08/22/2018   AST 31 08/22/2018   NA 143 08/22/2018   K 4.0 08/22/2018   CL 106 08/22/2018   CREATININE 1.12 08/22/2018   BUN 15 08/22/2018   CO2 28 08/22/2018   TSH 1.42 08/22/2018   PSA 2.49 08/22/2018   HGBA1C 8.0 (H) 06/06/2018   MICROALBUR 11.3 (H) 07/18/2017        Assessment & Plan:

## 2018-12-08 NOTE — Patient Instructions (Signed)
Please take all new medication as prescribed - the muscle relaxer as needed  Please continue all other medications as before, and refills have been done if requested.  Please have the pharmacy call with any other refills you may need.  Please continue your efforts at being more active, low cholesterol diet, and weight control.  Please keep your appointments with your specialists as you may have planned  Please go to the LAB in the Basement (turn left off the elevator) for the tests to be done today - just the urine testing today  You will be contacted by phone if any changes need to be made immediately.  Otherwise, you will receive a letter about your results with an explanation, but please check with MyChart first.  Please remember to sign up for MyChart if you have not done so, as this will be important to you in the future with finding out test results, communicating by private email, and scheduling acute appointments online when needed.

## 2018-12-09 LAB — URINALYSIS, ROUTINE W REFLEX MICROSCOPIC
Bilirubin Urine: NEGATIVE
Hgb urine dipstick: NEGATIVE
Ketones, ur: NEGATIVE
Leukocytes,Ua: NEGATIVE
Nitrite: NEGATIVE
RBC / HPF: NONE SEEN (ref 0–?)
Specific Gravity, Urine: 1.02 (ref 1.000–1.030)
Total Protein, Urine: NEGATIVE
Urine Glucose: >=1000 — AB
Urobilinogen, UA: 0.2 (ref 0.0–1.0)
pH: 6 (ref 5.0–8.0)

## 2018-12-10 LAB — URINE CULTURE
MICRO NUMBER:: 1029534
Result:: NO GROWTH
SPECIMEN QUALITY:: ADEQUATE

## 2018-12-13 ENCOUNTER — Encounter: Payer: Self-pay | Admitting: Internal Medicine

## 2018-12-13 NOTE — Assessment & Plan Note (Signed)
stable overall by history and exam, recent data reviewed with pt, and pt to continue medical treatment as before,  to f/u any worsening symptoms or concerns  

## 2018-12-13 NOTE — Assessment & Plan Note (Signed)
likeley msk strain, but cant r/o GU - for urine studies, flexeril prn, consider ct r/o stone pending urine studies

## 2019-03-03 ENCOUNTER — Encounter: Payer: Self-pay | Admitting: Endocrinology

## 2019-03-06 ENCOUNTER — Other Ambulatory Visit: Payer: Self-pay | Admitting: Internal Medicine

## 2019-03-06 ENCOUNTER — Other Ambulatory Visit: Payer: Self-pay

## 2019-03-06 MED ORDER — LISINOPRIL 5 MG PO TABS: ORAL_TABLET | ORAL | 1 refills | Status: DC

## 2019-03-06 MED ORDER — MELOXICAM 15 MG PO TABS: 15.0000 mg | ORAL_TABLET | Freq: Every day | ORAL | 1 refills | Status: DC | PRN

## 2019-03-06 MED ORDER — PRAVASTATIN SODIUM 40 MG PO TABS: ORAL_TABLET | ORAL | 1 refills | Status: DC

## 2019-03-06 NOTE — Telephone Encounter (Signed)
Per Dr. Loanne Drilling, unable to refill Tradjenta and Wilder Glade without an appt. Routing this message to the front desk for scheduling purposes.

## 2019-03-06 NOTE — Telephone Encounter (Signed)
Patient called stating his Rx for Tradjenta and Wilder Glade is unless it goes through [facility]-please send

## 2019-03-06 NOTE — Telephone Encounter (Signed)
LOV 05/13/18. Advised by Dr. Loanne Drilling to follow up in 2 months. Routing to Dr. Loanne Drilling for his review and to advise.

## 2019-03-06 NOTE — Telephone Encounter (Signed)
1.  Please schedule f/u appt 2.  Then please refill x 1, pending that appt.  

## 2019-03-06 NOTE — Telephone Encounter (Signed)
       1. Which medications need to be refilled? (please list name of each medication and dose if known)  lisinopril (ZESTRIL) 5 MG tablet pravastatin (PRAVACHOL) 40 MG tablet metFORMIN (GLUCOPHAGE) 500 MG tablet    2. Which pharmacy/location (including street and city if local pharmacy) is medication to be sent to? [facility]  3. Do they need a 30 day or 90 day supply? [place]

## 2019-03-10 ENCOUNTER — Other Ambulatory Visit: Payer: Self-pay

## 2019-03-10 MED ORDER — METFORMIN HCL 500 MG PO TABS: 500.0000 mg | ORAL_TABLET | Freq: Two times a day (BID) | ORAL | 2 refills | Status: DC

## 2019-03-10 MED ORDER — FARXIGA 5 MG PO TABS: 5.0000 mg | ORAL_TABLET | Freq: Every day | ORAL | 2 refills | Status: DC

## 2019-03-10 NOTE — Addendum Note (Signed)
Addended by: Breck Coons on: 03/10/2019 02:09 PM   Modules accepted: Orders

## 2019-03-12 NOTE — Telephone Encounter (Signed)
ATC to schedule appointment. VM Full. Unable to leave a message.

## 2019-03-16 ENCOUNTER — Encounter: Payer: Self-pay | Admitting: Internal Medicine

## 2019-03-17 ENCOUNTER — Other Ambulatory Visit: Payer: Self-pay

## 2019-03-17 ENCOUNTER — Ambulatory Visit (INDEPENDENT_AMBULATORY_CARE_PROVIDER_SITE_OTHER): Payer: BC Managed Care – PPO | Admitting: Internal Medicine

## 2019-03-17 ENCOUNTER — Encounter: Payer: Self-pay | Admitting: Endocrinology

## 2019-03-17 ENCOUNTER — Encounter: Payer: Self-pay | Admitting: Internal Medicine

## 2019-03-17 VITALS — BP 130/78 | Temp 98.1°F | Ht 74.0 in | Wt 225.4 lb

## 2019-03-17 DIAGNOSIS — E782 Mixed hyperlipidemia: Secondary | ICD-10-CM

## 2019-03-17 DIAGNOSIS — E119 Type 2 diabetes mellitus without complications: Secondary | ICD-10-CM

## 2019-03-17 DIAGNOSIS — R002 Palpitations: Secondary | ICD-10-CM | POA: Diagnosis not present

## 2019-03-17 MED ORDER — LINAGLIPTIN 5 MG PO TABS: 5.0000 mg | ORAL_TABLET | Freq: Every day | ORAL | 0 refills | Status: DC

## 2019-03-17 NOTE — Assessment & Plan Note (Signed)
stable overall by history and exam, recent data reviewed with pt, and pt to continue medical treatment as before,  to f/u any worsening symptoms or concerns  

## 2019-03-17 NOTE — Progress Notes (Signed)
Subjective:    Patient ID: William Sheppard, male    DOB: 08-Jul-1970, 49 y.o.   MRN: [id number]  HPI  Here with 10 days onset intermittent rapid palps with mild sob frequently. But Pt denies chest pain,, wheezing, orthopnea, PND, increased LE swelling, palpitations, dizziness or syncope. Pt denies new neurological symptoms such as new headache, or facial or extremity weakness or numbness   Pt c/o mild polydipsia, polyuria, but cbg's have been upper 200s often. Has not been taking tradjenta due to cost.   Plans to f/u with endo Dr Loanne Drilling soon, Last a1c 8.0 about 6 mo ago.  No fever, Denies hyper or hypo thyroid symptoms such as voice, skin or hair change.  No overt bleeding.  No recent increased caffeine.   Past Medical History:  Diagnosis Date  . Chronic headaches   . Diabetes mellitus   . Hodgkin's lymphoma ([place])   . Hyperlipidemia    Past Surgical History:  Procedure Laterality Date  . [place]  . WISDOM TOOTH EXTRACTION      reports that he has never smoked. He has never used smokeless tobacco. He reports current alcohol use. He reports that he does not use drugs. family history includes Diabetes in his father. No Known Allergies Current Outpatient Medications on File Prior to Visit  Medication Sig Dispense Refill  . aspirin 81 MG EC tablet Take 1 tablet (81 mg total) by mouth daily. Swallow whole. 30 tablet 12  . dapagliflozin propanediol (FARXIGA) 5 MG TABS tablet Take 5 mg by mouth daily. 90 tablet 2  . glucose blood test strip And lancets 1/day 250.00 100 each 12  . lisinopril (ZESTRIL) 5 MG tablet TAKE 1 TABLET EACH DAY. 90 tablet 1  . metFORMIN (GLUCOPHAGE) 500 MG tablet Take 1 tablet (500 mg total) by mouth 2 (two) times daily with a meal. 180 tablet 2  . pravastatin (PRAVACHOL) 40 MG tablet TAKE 1 TABLET EACH DAY. 90 tablet 1  . ALPRAZolam (XANAX) 1 MG tablet Take 1 tablet (1 mg total) by mouth 4 (four) times daily as needed for anxiety. (Patient not taking:  Reported on 03/17/2019) 20 tablet 0  . cholecalciferol (VITAMIN D3) 25 MCG (1000 UT) tablet Take 1,000 Units by mouth daily.    . cyclobenzaprine (FLEXERIL) 5 MG tablet Take 1 tablet (5 mg total) by mouth 3 (three) times daily as needed for muscle spasms. 40 tablet 1  . diclofenac sodium (VOLTAREN) 1 % GEL Apply 2 g topically 4 (four) times daily. (Patient not taking: Reported on 03/17/2019) 1 Tube 2  . meloxicam (MOBIC) 15 MG tablet Take 1 tablet (15 mg total) by mouth daily as needed for pain. 90 tablet 1  . testosterone (ANDROGEL) 50 MG/5GM (1%) GEL Place 5 g onto the skin daily. (Patient not taking: Reported on 03/17/2019) 450 g 1  . Vitamin D, Ergocalciferol, (DRISDOL) 1.25 MG (50000 UT) CAPS capsule Take 1 capsule (50,000 Units total) by mouth every 7 (seven) days. 12 capsule 0   No current facility-administered medications on file prior to visit.   Review of Systems All otherwise neg per pt     Objective:   Physical Exam BP 130/78 (BP Location: Left Arm, Patient Position: Sitting, Cuff Size: Normal)   Temp 98.1 F (36.7 C) (Oral)   Ht 6\' 2"  (1.88 m)   Wt 225 lb 6.4 oz (102.2 kg)   SpO2 99%   BMI 28.94 kg/m  VS noted,  Constitutional: Pt appears in  NAD HENT: Head: NCAT.  Right Ear: External ear normal.  Left Ear: External ear normal.  Eyes: . Pupils are equal, round, and reactive to light. Conjunctivae and EOM are normal Nose: without d/c or deformity Neck: Neck supple. Gross normal ROM Cardiovascular: Normal rate and regular rhythm.   Pulmonary/Chest: Effort normal and breath sounds without rales or wheezing.  Abd:  Soft, NT, ND, + BS, no organomegaly Neurological: Pt is alert. At baseline orientation, motor grossly intact Skin: Skin is warm. No rashes, other new lesions, no LE edema Psychiatric: Pt behavior is normal without agitation  All otherwise neg per pt  Lab Results  Component Value Date   WBC 3.8 (L) 08/22/2018   HGB 12.7 (L) 08/22/2018   HCT 40.2 08/22/2018    PLT 210.0 08/22/2018   GLUCOSE 104 (H) 08/22/2018   CHOL 139 06/06/2018   TRIG 276.0 (H) 06/06/2018   HDL 42.10 06/06/2018   LDLDIRECT 72.0 06/06/2018   LDLCALC 59 02/21/2018   ALT 60 (H) 08/22/2018   AST 31 08/22/2018   NA 143 08/22/2018   K 4.0 08/22/2018   CL 106 08/22/2018   CREATININE 1.12 08/22/2018   BUN 15 08/22/2018   CO2 28 08/22/2018   TSH 1.42 08/22/2018   PSA 2.49 08/22/2018   HGBA1C 8.0 (H) 06/06/2018   MICROALBUR 11.3 (H) 07/18/2017   ECG I have personally interpreted: NSR IRBBB, NSSTTWchanges     Assessment & Plan:

## 2019-03-17 NOTE — Assessment & Plan Note (Addendum)
Etiology unclear, cont asa, for echo, labs as ordered, ecg reveiwed, and cardiac event monitor  I spent 32 minutes preparing to see the patient by review of recent labs, imaging and procedures, obtaining and reviewing separately obtained history, communicating with the patient and family or caregiver, ordering medications, tests or procedures, and documenting clinical information in the EHR including the differential Dx, treatment, and any further evaluation and other management of palpitations, DM, HLD  .

## 2019-03-17 NOTE — Assessment & Plan Note (Signed)
Uncontrolled, for a1c today, consider restart tradjenta, cont diet, f/u endo as planned

## 2019-03-17 NOTE — Patient Instructions (Signed)
Your EKG was OK today  Please continue all other medications as before, and refills have been done if requested.  Please have the pharmacy call with any other refills you may need.  Please continue your efforts at being more active, low cholesterol diet, and weight control.  Please keep your appointments with your specialists as you may have planned  You will be contacted regarding the referral for: Echocardiogram, and Cardiac Event monitor  Please go to the LAB at the blood drawing area for the tests to be done - tomorrow at the [facility] will be contacted by phone if any changes need to be made immediately.  Otherwise, you will receive a letter about your results with an explanation, but please check with MyChart first.  Please remember to sign up for MyChart if you have not done so, as this will be important to you in the future with finding out test results, communicating by private email, and scheduling acute appointments online when needed.

## 2019-03-18 ENCOUNTER — Other Ambulatory Visit (INDEPENDENT_AMBULATORY_CARE_PROVIDER_SITE_OTHER): Payer: BC Managed Care – PPO

## 2019-03-18 ENCOUNTER — Encounter: Payer: Self-pay | Admitting: Internal Medicine

## 2019-03-18 ENCOUNTER — Telehealth: Payer: Self-pay | Admitting: *Deleted

## 2019-03-18 DIAGNOSIS — E782 Mixed hyperlipidemia: Secondary | ICD-10-CM

## 2019-03-18 DIAGNOSIS — E119 Type 2 diabetes mellitus without complications: Secondary | ICD-10-CM

## 2019-03-18 DIAGNOSIS — R002 Palpitations: Secondary | ICD-10-CM

## 2019-03-18 LAB — LIPID PANEL
Cholesterol: 140 mg/dL (ref 0–200)
HDL: 44.8 mg/dL (ref >39.00)
LDL Cholesterol: 75 mg/dL (ref 0–99)
NonHDL: 95.63
Total CHOL/HDL Ratio: 3
Triglycerides: 105 mg/dL (ref 0.0–149.0)
VLDL: 21 mg/dL (ref 0.0–40.0)

## 2019-03-18 LAB — CBC WITH DIFFERENTIAL/PLATELET
Basophils Absolute: 0 10*3/uL (ref 0.0–0.1)
Basophils Relative: 0.5 % (ref 0.0–3.0)
Eosinophils Absolute: 0.1 10*3/uL (ref 0.0–0.7)
Eosinophils Relative: 1.2 % (ref 0.0–5.0)
HCT: 42.7 % (ref 39.0–52.0)
Hemoglobin: 13.4 g/dL (ref 13.0–17.0)
Lymphocytes Relative: 53.7 % — ABNORMAL HIGH (ref 12.0–46.0)
Lymphs Abs: 2.6 10*3/uL (ref 0.7–4.0)
MCHC: 31.3 g/dL (ref 30.0–36.0)
MCV: 70.1 fl — ABNORMAL LOW (ref 78.0–100.0)
Monocytes Absolute: 0.4 10*3/uL (ref 0.1–1.0)
Monocytes Relative: 8.3 % (ref 3.0–12.0)
Neutro Abs: 1.7 10*3/uL (ref 1.4–7.7)
Neutrophils Relative %: 36.3 % — ABNORMAL LOW (ref 43.0–77.0)
Platelets: 197 10*3/uL (ref 150.0–400.0)
RBC: 6.09 Mil/uL — ABNORMAL HIGH (ref 4.22–5.81)
RDW: 15.5 % (ref 11.5–15.5)
WBC: 4.8 10*3/uL (ref 4.0–10.5)

## 2019-03-18 LAB — HEPATIC FUNCTION PANEL
ALT: 41 U/L (ref 0–53)
AST: 16 U/L (ref 0–37)
Albumin: 4.4 g/dL (ref 3.5–5.2)
Alkaline Phosphatase: 50 U/L (ref 39–117)
Bilirubin, Direct: 0.1 mg/dL (ref 0.0–0.3)
Total Bilirubin: 0.3 mg/dL (ref 0.2–1.2)
Total Protein: 7.8 g/dL (ref 6.0–8.3)

## 2019-03-18 LAB — BASIC METABOLIC PANEL
BUN: 18 mg/dL (ref 6–23)
CO2: 27 mEq/L (ref 19–32)
Calcium: 9.6 mg/dL (ref 8.4–10.5)
Chloride: 105 mEq/L (ref 96–112)
Creatinine, Ser: 1.09 mg/dL (ref 0.40–1.50)
GFR: 87.63 mL/min (ref >60.00)
Glucose, Bld: 127 mg/dL — ABNORMAL HIGH (ref 70–99)
Potassium: 4 mEq/L (ref 3.5–5.1)
Sodium: 141 mEq/L (ref 135–145)

## 2019-03-18 LAB — HEMOGLOBIN A1C: Hgb A1c MFr Bld: 8.1 % — ABNORMAL HIGH (ref 4.6–6.5)

## 2019-03-18 LAB — TSH: TSH: 2.3 u[IU]/mL (ref 0.35–4.50)

## 2019-03-18 NOTE — Telephone Encounter (Signed)
Patient enrolled for [employer] to ship a 14 day cardiac event monitor to his home.  Instructions reviewed briefly as they are included in the monitor kit.

## 2019-03-19 ENCOUNTER — Other Ambulatory Visit: Payer: Self-pay | Admitting: Endocrinology

## 2019-03-19 MED ORDER — SITAGLIPTIN PHOSPHATE 100 MG PO TABS: 100.0000 mg | ORAL_TABLET | Freq: Every day | ORAL | 0 refills | Status: DC

## 2019-03-23 ENCOUNTER — Other Ambulatory Visit: Payer: Self-pay | Admitting: Internal Medicine

## 2019-03-23 ENCOUNTER — Telehealth: Payer: Self-pay

## 2019-03-23 MED ORDER — FARXIGA 10 MG PO TABS: 10.0000 mg | ORAL_TABLET | Freq: Every day | ORAL | 3 refills | Status: DC

## 2019-03-23 NOTE — Telephone Encounter (Signed)
Insurance does not cover [employer] prefers:  Januvia  Please advise if you would like to change RX or do PA.

## 2019-03-25 ENCOUNTER — Encounter (HOSPITAL_COMMUNITY): Payer: Self-pay | Admitting: Radiology

## 2019-03-30 ENCOUNTER — Ambulatory Visit (INDEPENDENT_AMBULATORY_CARE_PROVIDER_SITE_OTHER): Payer: BC Managed Care – PPO

## 2019-03-30 ENCOUNTER — Other Ambulatory Visit: Payer: Self-pay

## 2019-03-30 DIAGNOSIS — R000 Tachycardia, unspecified: Secondary | ICD-10-CM

## 2019-03-30 DIAGNOSIS — I493 Ventricular premature depolarization: Secondary | ICD-10-CM

## 2019-03-30 DIAGNOSIS — R002 Palpitations: Secondary | ICD-10-CM

## 2019-04-01 ENCOUNTER — Encounter: Payer: Self-pay | Admitting: Endocrinology

## 2019-04-01 ENCOUNTER — Other Ambulatory Visit: Payer: Self-pay

## 2019-04-01 ENCOUNTER — Ambulatory Visit (INDEPENDENT_AMBULATORY_CARE_PROVIDER_SITE_OTHER): Payer: BC Managed Care – PPO | Admitting: Endocrinology

## 2019-04-01 VITALS — BP 118/70 | HR 102 | Ht 74.0 in | Wt 225.0 lb

## 2019-04-01 DIAGNOSIS — E119 Type 2 diabetes mellitus without complications: Secondary | ICD-10-CM | POA: Diagnosis not present

## 2019-04-01 MED ORDER — SITAGLIPTIN PHOSPHATE 100 MG PO TABS: 100.0000 mg | ORAL_TABLET | Freq: Every day | ORAL | 3 refills | Status: DC

## 2019-04-01 MED ORDER — METFORMIN HCL 500 MG PO TABS: 500.0000 mg | ORAL_TABLET | Freq: Every day | ORAL | 3 refills | Status: DC

## 2019-04-01 NOTE — Patient Instructions (Addendum)
check your blood sugar once a day.  vary the time of day when you check, between before the 3 meals, and at bedtime.  also check if you have symptoms of your blood sugar being too high or too low.  please keep a record of the readings and bring it to your next appointment here (or you can bring the meter itself).  You can write it on any piece of paper.  please call us sooner if your blood sugar goes below 70, or if you have a lot of readings over 200. I have sent a prescription to your pharmacy, to resume the Januvia, and: Please continue the same other medications.   Please come back for a follow-up appointment in 2 months.

## 2019-04-01 NOTE — Progress Notes (Signed)
Subjective:    Patient ID: William Sheppard, male    DOB: Aug 20, 1970, 49 y.o.   MRN: [id number]  HPI Pt returns for f/u of diabetes mellitus: DM type: 2 Dx'ed: 0000000 Complications: none Therapy: 3 oral meds DKA: never, but he had nonketotic hyperosmolar hyperglycemic state in 2016 Severe hypoglycemia: never Pancreatitis: never Pancreatic imaging: normal on 2000 CT Other: he has never been on insulin; pt says metformin causes diarrhea, so we are minimizing dosage.   Interval history: pt states he feels well in general.  Pt says rx option was changed to mail order, so there was a gap in medication, prior to the 03/18/19 A1c.  He has not recently taken the [place], due to ins. Past Medical History:  Diagnosis Date  . Chronic headaches   . Diabetes mellitus   . Hodgkin's lymphoma ([place])   . Hyperlipidemia     Past Surgical History:  Procedure Laterality Date  . [place]  . WISDOM TOOTH EXTRACTION      Social History   Socioeconomic History  . Marital status: Married    Spouse name: Not on file  . Number of children: Not on file  . Years of education: masters  . Highest education level: Not on file  Occupational History  . Occupation: Lobbyist: [employer]  Tobacco Use  . Smoking status: Never Smoker  . Smokeless tobacco: Never Used  Substance and Sexual Activity  . Alcohol use: Yes    Comment: social  . Drug use: No  . Sexual activity: Not on file  Other Topics Concern  . Not on file  Social History Narrative  . Not on file   Social Determinants of Health   Financial Resource Strain:   . Difficulty of Paying Living Expenses: Not on file  Food Insecurity:   . Worried About Charity fundraiser in the Last Year: Not on file  . Ran Out of Food in the Last Year: Not on file  Transportation Needs:   . Lack of Transportation (Medical): Not on file  . Lack of Transportation (Non-Medical): Not on file  Physical Activity:   . Days of  Exercise per Week: Not on file  . Minutes of Exercise per Session: Not on file  Stress:   . Feeling of Stress : Not on file  Social Connections:   . Frequency of Communication with Friends and Family: Not on file  . Frequency of Social Gatherings with Friends and Family: Not on file  . Attends Religious Services: Not on file  . Active Member of Clubs or Organizations: Not on file  . Attends Archivist Meetings: Not on file  . Marital Status: Not on file  Intimate Partner Violence:   . Fear of Current or Ex-Partner: Not on file  . Emotionally Abused: Not on file  . Physically Abused: Not on file  . Sexually Abused: Not on file    Current Outpatient Medications on File Prior to Visit  Medication Sig Dispense Refill  . ALPRAZolam (XANAX) 1 MG tablet Take 1 tablet (1 mg total) by mouth 4 (four) times daily as needed for anxiety. 20 tablet 0  . aspirin 81 MG EC tablet Take 1 tablet (81 mg total) by mouth daily. Swallow whole. 30 tablet 12  . cholecalciferol (VITAMIN D3) 25 MCG (1000 UT) tablet Take 1,000 Units by mouth daily.    . dapagliflozin propanediol (FARXIGA) 10 MG TABS tablet Take 10 mg by mouth  daily before breakfast. 90 tablet 3  . diclofenac sodium (VOLTAREN) 1 % GEL Apply 2 g topically 4 (four) times daily. 1 Tube 2  . glucose blood test strip And lancets 1/day 250.00 100 each 12  . lisinopril (ZESTRIL) 5 MG tablet TAKE 1 TABLET EACH DAY. 90 tablet 1  . pravastatin (PRAVACHOL) 40 MG tablet TAKE 1 TABLET EACH DAY. 90 tablet 1  . testosterone (ANDROGEL) 50 MG/5GM (1%) GEL Place 5 g onto the skin daily. 450 g 1   No current facility-administered medications on file prior to visit.    No Known Allergies  Family History  Problem Relation Age of Onset  . Diabetes Father     BP 118/70 (BP Location: Right Arm, Patient Position: Sitting, Cuff Size: Normal)   Pulse (!) 102   Ht 6\' 2"  (1.88 m)   Wt 225 lb (102.1 kg)   SpO2 96%   BMI 28.89 kg/m    Review of  Systems He has diarrhea if he tries to take metformin 500-BID.      Objective:   Physical Exam VITAL SIGNS:  See vs page GENERAL: no distress Pulses: dorsalis pedis intact bilat.   MSK: no deformity of the feet CV: no leg edema Skin:  no ulcer on the feet.  normal color and temp on the feet.  Neuro: sensation is intact to touch on the feet.    Lab Results  Component Value Date   HGBA1C 8.1 (H) 03/18/2019    Lab Results  Component Value Date   TSH 2.30 03/18/2019   Lab Results  Component Value Date   CREATININE 1.09 03/18/2019   BUN 18 03/18/2019   NA 141 03/18/2019   K 4.0 03/18/2019   CL 105 03/18/2019   CO2 27 03/18/2019       Assessment & Plan:  Type 2 DM: worse Diarrhea: This limits rx metformin dosage  Patient Instructions  check your blood sugar once a day.  vary the time of day when you check, between before the 3 meals, and at bedtime.  also check if you have symptoms of your blood sugar being too high or too low.  please keep a record of the readings and bring it to your next appointment here (or you can bring the meter itself).  You can write it on any piece of paper.  please call us sooner if your blood sugar goes below 70, or if you have a lot of readings over 200. I have sent a prescription to your pharmacy, to resume the Januvia, and: Please continue the same other medications.   Please come back for a follow-up appointment in 2 months.

## 2019-04-07 ENCOUNTER — Telehealth (HOSPITAL_COMMUNITY): Payer: Self-pay | Admitting: Radiology

## 2019-04-07 NOTE — Telephone Encounter (Signed)
Just an FYI. We have made several attempts to contact this patient including sending a letter to schedule or reschedule their echocardiogram. We will be removing the patient from the echo WQ.  2.10.21 Mail letter  2.10.21@ 1105 Mailbox is full evd  2.4.21@848  Mail box is full evd Thank you

## 2019-05-21 ENCOUNTER — Other Ambulatory Visit: Payer: Self-pay

## 2019-05-25 ENCOUNTER — Encounter: Payer: Self-pay | Admitting: Endocrinology

## 2019-05-25 ENCOUNTER — Other Ambulatory Visit: Payer: Self-pay

## 2019-05-25 ENCOUNTER — Ambulatory Visit (INDEPENDENT_AMBULATORY_CARE_PROVIDER_SITE_OTHER): Payer: BC Managed Care – PPO | Admitting: Endocrinology

## 2019-05-25 VITALS — BP 120/70 | HR 106 | Ht 74.0 in | Wt 217.0 lb

## 2019-05-25 DIAGNOSIS — E119 Type 2 diabetes mellitus without complications: Secondary | ICD-10-CM

## 2019-05-25 LAB — POCT GLYCOSYLATED HEMOGLOBIN (HGB A1C): Hemoglobin A1C: 9.8 % — AB (ref 4.0–5.6)

## 2019-05-25 MED ORDER — FREESTYLE LIBRE 14 DAY SENSOR MISC: 1.0000 | 3 refills | Status: DC

## 2019-05-25 MED ORDER — FREESTYLE LIBRE 14 DAY READER DEVI: 1.0000 | Freq: Once | 0 refills | Status: AC

## 2019-05-25 MED ORDER — RYBELSUS 7 MG PO TABS: 7.0000 mg | ORAL_TABLET | Freq: Every day | ORAL | 11 refills | Status: DC

## 2019-05-25 NOTE — Patient Instructions (Addendum)
check your blood sugar once a day.  vary the time of day when you check, between before the 3 meals, and at bedtime.  also check if you have symptoms of your blood sugar being too high or too low.  please keep a record of the readings and bring it to your next appointment here (or you can bring the meter itself).  You can write it on any piece of paper.  please call us sooner if your blood sugar goes below 70, or if you have a lot of readings over 200. I have sent a prescription to your pharmacy, to change the Januvia to "Rybelsus." If you have no nausea on this, please call in 1-2 weeks, so we can increase it.  Please continue the same other medications.   Please come back for a follow-up appointment in 2 months.

## 2019-05-25 NOTE — Progress Notes (Signed)
Subjective:    Patient ID: William Sheppard, male    DOB: 1970-12-13, 49 y.o.   MRN: [id number]  HPI Pt returns for f/u of diabetes mellitus: DM type: 2 Dx'ed: 0000000 Complications: none Therapy: 3 oral meds DKA: never, but he had nonketotic hyperosmolar hyperglycemic state in 2016 Severe hypoglycemia: never Pancreatitis: never Pancreatic imaging: normal on 2000 CT Other: he has never been on insulin; pt says metformin causes diarrhea, so we are minimizing dosage.   Interval history: pt states he feels well in general.  Pt says he takes meds as rx'ed.   Past Medical History:  Diagnosis Date  . Chronic headaches   . Diabetes mellitus   . Hodgkin's lymphoma ([place])   . Hyperlipidemia     Past Surgical History:  Procedure Laterality Date  . [place]  . WISDOM TOOTH EXTRACTION      Social History   Socioeconomic History  . Marital status: Married    Spouse name: Not on file  . Number of children: Not on file  . Years of education: masters  . Highest education level: Not on file  Occupational History  . Occupation: Lobbyist: [employer]  Tobacco Use  . Smoking status: Never Smoker  . Smokeless tobacco: Never Used  Substance and Sexual Activity  . Alcohol use: Yes    Comment: social  . Drug use: No  . Sexual activity: Not on file  Other Topics Concern  . Not on file  Social History Narrative  . Not on file   Social Determinants of Health   Financial Resource Strain:   . Difficulty of Paying Living Expenses:   Food Insecurity:   . Worried About Charity fundraiser in the Last Year:   . Arboriculturist in the Last Year:   Transportation Needs:   . Film/video editor (Medical):   [name] Lack of Transportation (Non-Medical):   Physical Activity:   . Days of Exercise per Week:   . Minutes of Exercise per Session:   Stress:   . Feeling of Stress :   Social Connections:   . Frequency of Communication with Friends and Family:   .  Frequency of Social Gatherings with Friends and Family:   . Attends Religious Services:   . Active Member of Clubs or Organizations:   . Attends Archivist Meetings:   [name] Marital Status:   Intimate Partner Violence:   . Fear of Current or Ex-Partner:   . Emotionally Abused:   [name] Physically Abused:   . Sexually Abused:     Current Outpatient Medications on File Prior to Visit  Medication Sig Dispense Refill  . ALPRAZolam (XANAX) 1 MG tablet Take 1 tablet (1 mg total) by mouth 4 (four) times daily as needed for anxiety. 20 tablet 0  . aspirin 81 MG EC tablet Take 1 tablet (81 mg total) by mouth daily. Swallow whole. 30 tablet 12  . cholecalciferol (VITAMIN D3) 25 MCG (1000 UT) tablet Take 1,000 Units by mouth daily.    . dapagliflozin propanediol (FARXIGA) 10 MG TABS tablet Take 10 mg by mouth daily before breakfast. 90 tablet 3  . diclofenac sodium (VOLTAREN) 1 % GEL Apply 2 g topically 4 (four) times daily. 1 Tube 2  . glucose blood test strip And lancets 1/day 250.00 100 each 12  . lisinopril (ZESTRIL) 5 MG tablet TAKE 1 TABLET EACH DAY. 90 tablet 1  . metFORMIN (GLUCOPHAGE) 500 MG  tablet Take 1 tablet (500 mg total) by mouth daily. 90 tablet 3  . pravastatin (PRAVACHOL) 40 MG tablet TAKE 1 TABLET EACH DAY. 90 tablet 1  . testosterone (ANDROGEL) 50 MG/5GM (1%) GEL Place 5 g onto the skin daily. 450 g 1   No current facility-administered medications on file prior to visit.    No Known Allergies  Family History  Problem Relation Age of Onset  . Diabetes Father     BP 120/70 (BP Location: Left Arm, Patient Position: Sitting, Cuff Size: Normal)   Pulse (!) 106   Ht 6\' 2"  (1.88 m)   Wt 217 lb (98.4 kg)   SpO2 96%   BMI 27.86 kg/m    Review of Systems He has lost weight, due to his efforts.     Objective:   Physical Exam VITAL SIGNS:  See vs page GENERAL: no distress Pulses: dorsalis pedis intact bilat.   MSK: no deformity of the feet CV: no leg edema Skin:   no ulcer on the feet.  normal color and temp on the feet. Neuro: sensation is intact to touch on the feet.   Lab Results  Component Value Date   TSH 2.30 03/18/2019   Lab Results  Component Value Date   HGBA1C 9.8 (A) 05/25/2019   Lab Results  Component Value Date   CREATININE 1.09 03/18/2019   BUN 18 03/18/2019   NA 141 03/18/2019   K 4.0 03/18/2019   CL 105 03/18/2019   CO2 27 03/18/2019       Assessment & Plan:  Type 2 DM: worse Diarrhea: Hypoglycemia: this limits metformin dosage.   Patient Instructions  check your blood sugar once a day.  vary the time of day when you check, between before the 3 meals, and at bedtime.  also check if you have symptoms of your blood sugar being too high or too low.  please keep a record of the readings and bring it to your next appointment here (or you can bring the meter itself).  You can write it on any piece of paper.  please call us sooner if your blood sugar goes below 70, or if you have a lot of readings over 200. I have sent a prescription to your pharmacy, to change the Januvia to "Rybelsus." If you have no nausea on this, please call in 1-2 weeks, so we can increase it.  Please continue the same other medications.   Please come back for a follow-up appointment in 2 months.

## 2019-06-03 ENCOUNTER — Encounter: Payer: Self-pay | Admitting: Endocrinology

## 2019-06-03 ENCOUNTER — Other Ambulatory Visit: Payer: Self-pay

## 2019-06-03 DIAGNOSIS — E119 Type 2 diabetes mellitus without complications: Secondary | ICD-10-CM

## 2019-06-03 MED ORDER — RYBELSUS 7 MG PO TABS: 7.0000 mg | ORAL_TABLET | Freq: Every day | ORAL | 0 refills | Status: DC

## 2019-06-08 ENCOUNTER — Encounter: Payer: Self-pay | Admitting: Internal Medicine

## 2019-06-09 MED ORDER — DOXYCYCLINE HYCLATE 100 MG PO TABS: 100.0000 mg | ORAL_TABLET | Freq: Two times a day (BID) | ORAL | 0 refills | Status: DC

## 2019-06-09 NOTE — Telephone Encounter (Signed)
rx done to [facility] as there is no Public house manager on file

## 2019-06-15 ENCOUNTER — Encounter: Payer: Self-pay | Admitting: Internal Medicine

## 2019-06-22 ENCOUNTER — Encounter: Payer: Self-pay | Admitting: General Practice

## 2019-07-14 ENCOUNTER — Other Ambulatory Visit: Payer: Self-pay | Admitting: Internal Medicine

## 2019-08-04 ENCOUNTER — Other Ambulatory Visit: Payer: Self-pay

## 2019-08-04 ENCOUNTER — Ambulatory Visit (INDEPENDENT_AMBULATORY_CARE_PROVIDER_SITE_OTHER): Payer: BC Managed Care – PPO | Admitting: Endocrinology

## 2019-08-04 ENCOUNTER — Encounter: Payer: Self-pay | Admitting: Endocrinology

## 2019-08-04 VITALS — BP 130/88 | HR 106 | Ht 74.0 in | Wt 221.0 lb

## 2019-08-04 DIAGNOSIS — E119 Type 2 diabetes mellitus without complications: Secondary | ICD-10-CM | POA: Diagnosis not present

## 2019-08-04 LAB — POCT GLYCOSYLATED HEMOGLOBIN (HGB A1C): Hemoglobin A1C: 7.7 % — AB (ref 4.0–5.6)

## 2019-08-04 MED ORDER — DAPAGLIFLOZIN PROPANEDIOL 10 MG PO TABS: 10.0000 mg | ORAL_TABLET | Freq: Every day | ORAL | 3 refills | Status: DC

## 2019-08-04 MED ORDER — RYBELSUS 7 MG PO TABS: 7.0000 mg | ORAL_TABLET | Freq: Every day | ORAL | 0 refills | Status: DC

## 2019-08-04 NOTE — Progress Notes (Signed)
Subjective:    Patient ID: William Sheppard, male    DOB: 1970-07-31, 49 y.o.   MRN: [id number]  HPI Pt returns for f/u of diabetes mellitus: DM type: 2 Dx'ed: 9758 Complications: none Therapy: 3 oral meds.   DKA: never, but he had nonketotic hyperosmolar hyperglycemic state in 2016.   Severe hypoglycemia: never Pancreatitis: never Pancreatic imaging: normal on 2000 CT Other: he has never been on insulin; pt says metformin causes diarrhea, so we are minimizing dosage.   Interval history: pt states he feels well in general, except for "stomach feeling full.".  Pt says he takes meds as rx'ed.  He does not check cbg's.   Past Medical History:  Diagnosis Date  . Chronic headaches   . Diabetes mellitus   . Hodgkin's lymphoma ([place])   . Hyperlipidemia     Past Surgical History:  Procedure Laterality Date  . [place]  . WISDOM TOOTH EXTRACTION      Social History   Socioeconomic History  . Marital status: Married    Spouse name: Not on file  . Number of children: Not on file  . Years of education: masters  . Highest education level: Not on file  Occupational History  . Occupation: Lobbyist: [employer]  Tobacco Use  . Smoking status: Never Smoker  . Smokeless tobacco: Never Used  Vaping Use  . Vaping Use: Never used  Substance and Sexual Activity  . Alcohol use: Yes    Comment: social  . Drug use: No  . Sexual activity: Not on file  Other Topics Concern  . Not on file  Social History Narrative  . Not on file   Social Determinants of Health   Financial Resource Strain:   . Difficulty of Paying Living Expenses:   Food Insecurity:   . Worried About Charity fundraiser in the Last Year:   . Arboriculturist in the Last Year:   Transportation Needs:   . Film/video editor (Medical):   [name] Lack of Transportation (Non-Medical):   Physical Activity:   . Days of Exercise per Week:   . Minutes of Exercise per Session:   Stress:    . Feeling of Stress :   Social Connections:   . Frequency of Communication with Friends and Family:   . Frequency of Social Gatherings with Friends and Family:   . Attends Religious Services:   . Active Member of Clubs or Organizations:   . Attends Archivist Meetings:   [name] Marital Status:   Intimate Partner Violence:   . Fear of Current or Ex-Partner:   . Emotionally Abused:   [name] Physically Abused:   . Sexually Abused:     Current Outpatient Medications on File Prior to Visit  Medication Sig Dispense Refill  . ALPRAZolam (XANAX) 1 MG tablet Take 1 tablet (1 mg total) by mouth 4 (four) times daily as needed for anxiety. 20 tablet 0  . aspirin 81 MG EC tablet Take 1 tablet (81 mg total) by mouth daily. Swallow whole. 30 tablet 12  . cholecalciferol (VITAMIN D3) 25 MCG (1000 UT) tablet Take 1,000 Units by mouth daily.    . diclofenac sodium (VOLTAREN) 1 % GEL Apply 2 g topically 4 (four) times daily. 1 Tube 2  . doxycycline (VIBRA-TABS) 100 MG tablet Take 1 tablet (100 mg total) by mouth 2 (two) times daily. 20 tablet 0  . glucose blood test strip And lancets  1/day 250.00 100 each 12  . lisinopril (ZESTRIL) 5 MG tablet TAKE 1 TABLET DAILY 90 tablet 3  . metFORMIN (GLUCOPHAGE) 500 MG tablet Take 1 tablet (500 mg total) by mouth daily. 90 tablet 3  . pravastatin (PRAVACHOL) 40 MG tablet TAKE 1 TABLET DAILY 90 tablet 3  . testosterone (ANDROGEL) 50 MG/5GM (1%) GEL Place 5 g onto the skin daily. 450 g 1   No current facility-administered medications on file prior to visit.    No Known Allergies  Family History  Adopted: Yes    BP 130/88   Pulse (!) 106   Ht 6\' 2"  (1.88 m)   Wt 221 lb (100.2 kg)   SpO2 94%   BMI 28.37 kg/m    Review of Systems Denies n/v.  He has lost a few lbs.      Objective:   Physical Exam VITAL SIGNS:  See vs page GENERAL: no distress Pulses: dorsalis pedis intact bilat.   MSK: no deformity of the feet CV: no leg edema.   Skin:  no  ulcer on the feet.  normal color and temp on the feet.   Neuro: sensation is intact to touch on the feet.    Lab Results  Component Value Date   HGBA1C 7.7 (A) 08/04/2019   Lab Results  Component Value Date   TSH 2.30 03/18/2019   Lab Results  Component Value Date   CREATININE 1.09 03/18/2019   BUN 18 03/18/2019   NA 141 03/18/2019   K 4.0 03/18/2019   CL 105 03/18/2019   CO2 27 03/18/2019       Assessment & Plan:  Type 2 DM: he would benefit from increased rx, if it can be done with a regimen that avoids or minimizes hypoglycemia.  He declines to add another medication.  abd bloating, due to Rybelsus.  We can't increase now.    Patient Instructions  check your blood sugar once a day.  vary the time of day when you check, between before the 3 meals, and at bedtime.  also check if you have symptoms of your blood sugar being too high or too low.  please keep a record of the readings and bring it to your next appointment here (or you can bring the meter itself).  You can write it on any piece of paper.  please call us sooner if your blood sugar goes below 70, or if you have a lot of readings over 200. Please continue the same diabetes medications.   Please come back for a follow-up appointment in 2-3 months.

## 2019-08-04 NOTE — Patient Instructions (Addendum)
check your blood sugar once a day.  vary the time of day when you check, between before the 3 meals, and at bedtime.  also check if you have symptoms of your blood sugar being too high or too low.  please keep a record of the readings and bring it to your next appointment here (or you can bring the meter itself).  You can write it on any piece of paper.  please call us sooner if your blood sugar goes below 70, or if you have a lot of readings over 200. Please continue the same diabetes medications.   Please come back for a follow-up appointment in 2-3 months.

## 2019-08-14 ENCOUNTER — Other Ambulatory Visit: Payer: Self-pay | Admitting: Endocrinology

## 2019-08-14 DIAGNOSIS — E119 Type 2 diabetes mellitus without complications: Secondary | ICD-10-CM

## 2019-08-26 ENCOUNTER — Other Ambulatory Visit (INDEPENDENT_AMBULATORY_CARE_PROVIDER_SITE_OTHER): Payer: BC Managed Care – PPO

## 2019-08-26 ENCOUNTER — Ambulatory Visit (INDEPENDENT_AMBULATORY_CARE_PROVIDER_SITE_OTHER): Payer: BC Managed Care – PPO | Admitting: Internal Medicine

## 2019-08-26 ENCOUNTER — Encounter: Payer: Self-pay | Admitting: Internal Medicine

## 2019-08-26 ENCOUNTER — Other Ambulatory Visit: Payer: Self-pay

## 2019-08-26 VITALS — BP 118/80 | HR 95 | Temp 98.1°F | Ht 74.0 in | Wt 222.0 lb

## 2019-08-26 DIAGNOSIS — E119 Type 2 diabetes mellitus without complications: Secondary | ICD-10-CM

## 2019-08-26 DIAGNOSIS — Z0000 Encounter for general adult medical examination without abnormal findings: Secondary | ICD-10-CM | POA: Diagnosis not present

## 2019-08-26 DIAGNOSIS — E559 Vitamin D deficiency, unspecified: Secondary | ICD-10-CM

## 2019-08-26 DIAGNOSIS — Z1159 Encounter for screening for other viral diseases: Secondary | ICD-10-CM | POA: Diagnosis not present

## 2019-08-26 LAB — LIPID PANEL
Cholesterol: 147 mg/dL (ref 0–200)
HDL: 42.5 mg/dL (ref >39.00)
NonHDL: 104.86
Total CHOL/HDL Ratio: 3
Triglycerides: 271 mg/dL — ABNORMAL HIGH (ref 0.0–149.0)
VLDL: 54.2 mg/dL — ABNORMAL HIGH (ref 0.0–40.0)

## 2019-08-26 LAB — BASIC METABOLIC PANEL
BUN: 17 mg/dL (ref 6–23)
CO2: 26 mEq/L (ref 19–32)
Calcium: 9.6 mg/dL (ref 8.4–10.5)
Chloride: 106 mEq/L (ref 96–112)
Creatinine, Ser: 1.05 mg/dL (ref 0.40–1.50)
GFR: 91.32 mL/min (ref >60.00)
Glucose, Bld: 92 mg/dL (ref 70–99)
Potassium: 4 mEq/L (ref 3.5–5.1)
Sodium: 139 mEq/L (ref 135–145)

## 2019-08-26 LAB — CBC WITH DIFFERENTIAL/PLATELET
Basophils Absolute: 0 10*3/uL (ref 0.0–0.1)
Basophils Relative: 0.5 % (ref 0.0–3.0)
Eosinophils Absolute: 0 10*3/uL (ref 0.0–0.7)
Eosinophils Relative: 1.2 % (ref 0.0–5.0)
HCT: 42.2 % (ref 39.0–52.0)
Hemoglobin: 13.5 g/dL (ref 13.0–17.0)
Lymphocytes Relative: 56.1 % — ABNORMAL HIGH (ref 12.0–46.0)
Lymphs Abs: 2.4 10*3/uL (ref 0.7–4.0)
MCHC: 31.9 g/dL (ref 30.0–36.0)
MCV: 70.5 fl — ABNORMAL LOW (ref 78.0–100.0)
Monocytes Absolute: 0.3 10*3/uL (ref 0.1–1.0)
Monocytes Relative: 7.2 % (ref 3.0–12.0)
Neutro Abs: 1.5 10*3/uL (ref 1.4–7.7)
Neutrophils Relative %: 35 % — ABNORMAL LOW (ref 43.0–77.0)
Platelets: 203 10*3/uL (ref 150.0–400.0)
RBC: 5.98 Mil/uL — ABNORMAL HIGH (ref 4.22–5.81)
RDW: 15.9 % — ABNORMAL HIGH (ref 11.5–15.5)
WBC: 4.2 10*3/uL (ref 4.0–10.5)

## 2019-08-26 LAB — LDL CHOLESTEROL, DIRECT: Direct LDL: 82 mg/dL

## 2019-08-26 LAB — MICROALBUMIN / CREATININE URINE RATIO
Creatinine,U: 106.7 mg/dL
Microalb Creat Ratio: 2.8 mg/g (ref 0.0–30.0)
Microalb, Ur: 3 mg/dL — ABNORMAL HIGH (ref 0.0–1.9)

## 2019-08-26 LAB — HEPATIC FUNCTION PANEL
ALT: 39 U/L (ref 0–53)
AST: 18 U/L (ref 0–37)
Albumin: 4.6 g/dL (ref 3.5–5.2)
Alkaline Phosphatase: 53 U/L (ref 39–117)
Bilirubin, Direct: 0.1 mg/dL (ref 0.0–0.3)
Total Bilirubin: 0.4 mg/dL (ref 0.2–1.2)
Total Protein: 7.6 g/dL (ref 6.0–8.3)

## 2019-08-26 NOTE — Progress Notes (Signed)
Subjective:    Patient ID: William Sheppard, male    DOB: 24-Oct-1970, 49 y.o.   MRN: [id number]  HPI  Here for wellness and f/u;  Overall doing ok;  Pt denies Chest pain, worsening SOB, DOE, wheezing, orthopnea, PND, worsening LE edema, palpitations, dizziness or syncope.  Pt denies neurological change such as new headache, facial or extremity weakness.  Pt denies polydipsia, polyuria, or low sugar symptoms. Pt states overall good compliance with treatment and medications, good tolerability, and has been trying to follow appropriate diet.  Pt denies worsening depressive symptoms, suicidal ideation or panic. No fever, night sweats, wt loss, loss of appetite, or other constitutional symptoms.  Pt states good ability with ADL's, has low fall risk, home safety reviewed and adequate, no other significant changes in hearing or vision, and only occasionally active with exercise. Plans to make eye doctor appt soon. No new complaints Past Medical History:  Diagnosis Date  . Chronic headaches   . Diabetes mellitus   . Hodgkin's lymphoma ([place])   . Hyperlipidemia    Past Surgical History:  Procedure Laterality Date  . [place]  . WISDOM TOOTH EXTRACTION      reports that he has never smoked. He has never used smokeless tobacco. He reports current alcohol use. He reports that he does not use drugs. family history is not on file. He was adopted. No Known Allergies Current Outpatient Medications on File Prior to Visit  Medication Sig Dispense Refill  . ALPRAZolam (XANAX) 1 MG tablet Take 1 tablet (1 mg total) by mouth 4 (four) times daily as needed for anxiety. 20 tablet 0  . aspirin 81 MG EC tablet Take 1 tablet (81 mg total) by mouth daily. Swallow whole. 30 tablet 12  . cholecalciferol (VITAMIN D3) 25 MCG (1000 UT) tablet Take 1,000 Units by mouth daily.    . dapagliflozin propanediol (FARXIGA) 10 MG TABS tablet Take 1 tablet (10 mg total) by mouth daily before breakfast. 90 tablet 3    . diclofenac sodium (VOLTAREN) 1 % GEL Apply 2 g topically 4 (four) times daily. 1 Tube 2  . doxycycline (VIBRA-TABS) 100 MG tablet Take 1 tablet (100 mg total) by mouth 2 (two) times daily. 20 tablet 0  . glucose blood test strip And lancets 1/day 250.00 100 each 12  . lisinopril (ZESTRIL) 5 MG tablet TAKE 1 TABLET DAILY 90 tablet 3  . metFORMIN (GLUCOPHAGE) 500 MG tablet Take 1 tablet (500 mg total) by mouth daily. 90 tablet 3  . pravastatin (PRAVACHOL) 40 MG tablet TAKE 1 TABLET DAILY 90 tablet 3  . RYBELSUS 7 MG TABS TAKE 1 TABLET DAILY 90 tablet 3  . testosterone (ANDROGEL) 50 MG/5GM (1%) GEL Place 5 g onto the skin daily. 450 g 1   No current facility-administered medications on file prior to visit.   Review of Systems All otherwise neg per pt    Objective:   Physical Exam BP 118/80   Pulse 95   Temp 98.1 F (36.7 C) (Oral)   Ht 6\' 2"  (1.88 m)   Wt 222 lb (100.7 kg)   SpO2 96%   BMI 28.50 kg/m  VS noted,  Constitutional: Pt appears in NAD HENT: Head: NCAT.  Right Ear: External ear normal.  Left Ear: External ear normal.  Eyes: . Pupils are equal, round, and reactive to light. Conjunctivae and EOM are normal Nose: without d/c or deformity Neck: Neck supple. Gross normal ROM Cardiovascular: Normal rate and  regular rhythm.   Pulmonary/Chest: Effort normal and breath sounds without rales or wheezing.  Abd:  Soft, NT, ND, + BS, no organomegaly Neurological: Pt is alert. At baseline orientation, motor grossly intact Skin: Skin is warm. No rashes, other new lesions, no LE edema Psychiatric: Pt behavior is normal without agitation  All otherwise neg per pt Lab Results  Component Value Date   WBC 4.2 08/26/2019   HGB 13.5 08/26/2019   HCT 42.2 08/26/2019   PLT 203.0 08/26/2019   GLUCOSE 92 08/26/2019   CHOL 147 08/26/2019   TRIG 271.0 (H) 08/26/2019   HDL 42.50 08/26/2019   LDLDIRECT 82.0 08/26/2019   LDLCALC 75 03/18/2019   ALT 39 08/26/2019   AST 18 08/26/2019    NA 139 08/26/2019   K 4.0 08/26/2019   CL 106 08/26/2019   CREATININE 1.05 08/26/2019   BUN 17 08/26/2019   CO2 26 08/26/2019   TSH 1.20 08/26/2019   PSA 0.69 08/26/2019   HGBA1C 7.7 (A) 08/04/2019   MICROALBUR 3.0 (H) 08/26/2019      Assessment & Plan:

## 2019-08-26 NOTE — Patient Instructions (Signed)
Please continue all other medications as before, and refills have been done if requested.  Please have the pharmacy call with any other refills you may need.  Please continue your efforts at being more active, low cholesterol diet, and weight control.  You are otherwise up to date with prevention measures today.  Please keep your appointments with your specialists as you may have planned  Please go to the LAB at the blood drawing area for the tests to be done - at the [facility] will be contacted by phone if any changes need to be made immediately.  Otherwise, you will receive a letter about your results with an explanation, but please check with MyChart first.  Please remember to sign up for MyChart if you have not done so, as this will be important to you in the future with finding out test results, communicating by private email, and scheduling acute appointments online when needed.  Please make an Appointment to return for your 1 year visit, or sooner if needed

## 2019-08-27 ENCOUNTER — Encounter: Payer: Self-pay | Admitting: Internal Medicine

## 2019-08-27 LAB — HEPATITIS C ANTIBODY
Hepatitis C Ab: NONREACTIVE
SIGNAL TO CUT-OFF: 0.02 (ref <1.00)

## 2019-08-27 LAB — URINALYSIS, ROUTINE W REFLEX MICROSCOPIC
Bilirubin Urine: NEGATIVE
Hgb urine dipstick: NEGATIVE
Ketones, ur: NEGATIVE
Leukocytes,Ua: NEGATIVE
Nitrite: NEGATIVE
Specific Gravity, Urine: 1.02 (ref 1.000–1.030)
Total Protein, Urine: NEGATIVE
Urine Glucose: >=1000 — AB
Urobilinogen, UA: 0.2 (ref 0.0–1.0)
pH: 6 (ref 5.0–8.0)

## 2019-08-27 LAB — VITAMIN D 25 HYDROXY (VIT D DEFICIENCY, FRACTURES): VITD: 31.89 ng/mL (ref 30.00–100.00)

## 2019-08-27 LAB — TSH: TSH: 1.2 u[IU]/mL (ref 0.35–4.50)

## 2019-08-27 LAB — PSA: PSA: 0.69 ng/mL (ref 0.10–4.00)

## 2019-08-29 ENCOUNTER — Encounter: Payer: Self-pay | Admitting: Internal Medicine

## 2019-08-29 NOTE — Assessment & Plan Note (Signed)
stable overall by history and exam, recent data reviewed with pt, and pt to continue medical treatment as before,  to f/u any worsening symptoms or concerns, cont f/u endo

## 2019-08-29 NOTE — Assessment & Plan Note (Signed)

## 2019-11-06 ENCOUNTER — Encounter: Payer: Self-pay | Admitting: Internal Medicine

## 2019-11-13 ENCOUNTER — Ambulatory Visit: Payer: BC Managed Care – PPO | Admitting: Endocrinology

## 2019-12-02 ENCOUNTER — Other Ambulatory Visit: Payer: Self-pay | Admitting: Internal Medicine

## 2019-12-02 NOTE — Telephone Encounter (Signed)
Please refill as per office routine med refill policy (all routine meds refilled for 3 mo or monthly per pt preference up to one year from last visit, then month to month grace period for 3 mo, then further med refills will have to be denied)  

## 2019-12-02 NOTE — Telephone Encounter (Signed)
Sorry - disregard refill ?

## 2019-12-09 ENCOUNTER — Telehealth: Payer: Self-pay | Admitting: General Practice

## 2019-12-09 NOTE — Telephone Encounter (Signed)
William Sheppard from [facility] called to get service dates for heart monitor.

## 2019-12-16 ENCOUNTER — Encounter: Payer: Self-pay | Admitting: Internal Medicine

## 2019-12-22 ENCOUNTER — Encounter: Payer: Self-pay | Admitting: Internal Medicine

## 2020-01-06 ENCOUNTER — Other Ambulatory Visit: Payer: Self-pay

## 2020-01-06 ENCOUNTER — Encounter: Payer: Self-pay | Admitting: Internal Medicine

## 2020-01-06 ENCOUNTER — Ambulatory Visit (INDEPENDENT_AMBULATORY_CARE_PROVIDER_SITE_OTHER): Payer: BC Managed Care – PPO | Admitting: Internal Medicine

## 2020-01-06 VITALS — BP 122/86 | HR 94 | Temp 98.2°F | Ht 74.0 in | Wt 224.0 lb

## 2020-01-06 DIAGNOSIS — E559 Vitamin D deficiency, unspecified: Secondary | ICD-10-CM | POA: Diagnosis not present

## 2020-01-06 DIAGNOSIS — E119 Type 2 diabetes mellitus without complications: Secondary | ICD-10-CM

## 2020-01-06 DIAGNOSIS — E782 Mixed hyperlipidemia: Secondary | ICD-10-CM | POA: Diagnosis not present

## 2020-01-06 DIAGNOSIS — N529 Male erectile dysfunction, unspecified: Secondary | ICD-10-CM | POA: Diagnosis not present

## 2020-01-06 DIAGNOSIS — Z0000 Encounter for general adult medical examination without abnormal findings: Secondary | ICD-10-CM

## 2020-01-06 LAB — POCT GLYCOSYLATED HEMOGLOBIN (HGB A1C): Hemoglobin A1C: 7.2 % — AB (ref 4.0–5.6)

## 2020-01-06 MED ORDER — TADALAFIL 20 MG PO TABS: 10.0000 mg | ORAL_TABLET | ORAL | 11 refills | Status: DC | PRN

## 2020-01-06 NOTE — Patient Instructions (Signed)
Please take all new medication as prescribed - the cialis as needed  Please continue all other medications as before, and refills have been done if requested.  Please have the pharmacy call with any other refills you may need.  Please continue your efforts at being more active, low cholesterol diet, and weight control..  Please keep your appointments with your specialists as you may have planned  Your A1c was [facility] today  Please make an Appointment to return in 6 months, or sooner if needed, also with Lab Appointment for testing done 3-5 days before at the [facility] (so this is for TWO appointments - please see the scheduling desk as you leave)  Due to the ongoing Covid 19 pandemic, our lab now requires an appointment for any labs done at our office.  If you need labs done and do not have an appointment, please call our office ahead of time to schedule before presenting to the lab for your testing.

## 2020-01-06 NOTE — Progress Notes (Signed)
Subjective:    Patient ID: William Sheppard, male    DOB: [birth year], 49 y.o.   MRN: [id number]  HPI  Here to f/u; overall doing ok,  Pt denies chest pain, increasing sob or doe, wheezing, orthopnea, PND, increased LE swelling, palpitations, dizziness or syncope.  Pt denies new neurological symptoms such as new headache, or facial or extremity weakness or numbness.  Pt denies polydipsia, polyuria, or low sugar episode.  Pt states overall good compliance with meds, mostly trying to follow appropriate diet, with wt overall stable,  but little exercise however. Has worsening ED symptoms for 6 mo. Past Medical History:  Diagnosis Date  . Chronic headaches   . Diabetes mellitus   . Hodgkin's lymphoma ([place])   . Hyperlipidemia    Past Surgical History:  Procedure Laterality Date  . [place]  . WISDOM TOOTH EXTRACTION      reports that he has never smoked. He has never used smokeless tobacco. He reports current alcohol use. He reports that he does not use drugs. family history is not on file. He was adopted. No Known Allergies Current Outpatient Medications on File Prior to Visit  Medication Sig Dispense Refill  . ALPRAZolam (XANAX) 1 MG tablet Take 1 tablet (1 mg total) by mouth 4 (four) times daily as needed for anxiety. 20 tablet 0  . aspirin 81 MG EC tablet Take 1 tablet (81 mg total) by mouth daily. Swallow whole. 30 tablet 12  . dapagliflozin propanediol (FARXIGA) 10 MG TABS tablet Take 1 tablet (10 mg total) by mouth daily before breakfast. 90 tablet 3  . doxycycline (VIBRA-TABS) 100 MG tablet Take 1 tablet (100 mg total) by mouth 2 (two) times daily. 20 tablet 0  . glucose blood test strip And lancets 1/day 250.00 100 each 12  . lisinopril (ZESTRIL) 5 MG tablet TAKE 1 TABLET DAILY 90 tablet 3  . metFORMIN (GLUCOPHAGE) 500 MG tablet Take 1 tablet (500 mg total) by mouth daily. 90 tablet 3  . pravastatin (PRAVACHOL) 40 MG tablet TAKE 1 TABLET DAILY 90 tablet 3  . RYBELSUS  7 MG TABS TAKE 1 TABLET DAILY 90 tablet 3   No current facility-administered medications on file prior to visit.   Review of Systems All otherwise neg per pt    Objective:   Physical Exam BP 122/86 (BP Location: Left Arm, Patient Position: Sitting, Cuff Size: Large)   Pulse 94   Temp 98.2 F (36.8 C) (Oral)   Ht 6\' 2"  (1.88 m)   Wt 224 lb (101.6 kg)   SpO2 95%   BMI 28.76 kg/m  VS noted,  Constitutional: Pt appears in NAD HENT: Head: NCAT.  Right Ear: External ear normal.  Left Ear: External ear normal.  Eyes: . Pupils are equal, round, and reactive to light. Conjunctivae and EOM are normal Nose: without d/c or deformity Neck: Neck supple. Gross normal ROM Cardiovascular: Normal rate and regular rhythm.   Pulmonary/Chest: Effort normal and breath sounds without rales or wheezing.  Abd:  Soft, NT, ND, + BS, no organomegaly Neurological: Pt is alert. At baseline orientation, motor grossly intact Skin: Skin is warm. No rashes, other new lesions, no LE edema Psychiatric: Pt behavior is normal without agitation  All otherwise neg per pt  Contains abnormal dataPOCT glycosylated hemoglobin (Hb A1C) Order: [id number] Status:  Final result Visible to patient:  No (scheduled for 01/06/2020 12:05 PM) Dx:  Diabetes mellitus type II, non insuli...  0 Result Notes  1 HM Topic  Ref Range & Units 11:05 5 mo ago 7 mo ago 9 mo ago 1 yr ago  Hemoglobin A1C 4.0 - 5.6 % 7.2Abnormal  7.7Abnormal  9.8Abnormal  8.1High R, CM  8.0High R,           Assessment & Plan:

## 2020-01-09 ENCOUNTER — Encounter: Payer: Self-pay | Admitting: Internal Medicine

## 2020-01-09 DIAGNOSIS — E559 Vitamin D deficiency, unspecified: Secondary | ICD-10-CM

## 2020-01-09 DIAGNOSIS — N529 Male erectile dysfunction, unspecified: Secondary | ICD-10-CM

## 2020-01-09 HISTORY — DX: Vitamin D deficiency, unspecified: E55.9

## 2020-01-09 HISTORY — DX: Male erectile dysfunction, unspecified: N52.9

## 2020-01-09 NOTE — Assessment & Plan Note (Signed)
For vit d 2000 qd

## 2020-01-09 NOTE — Assessment & Plan Note (Addendum)
Mild intermittent worsening, for PDE5 trial,  to f/u any worsening symptoms or concerns  I spent 31 minutes in preparing to see the patient by review of recent labs, imaging and procedures, obtaining and reviewing separately obtained history, communicating with the patient and family or caregiver, ordering medications, tests or procedures, and documenting clinical information in the EHR including the differential Dx, treatment, and any further evaluation and other management of ed, hld, dm, vit d def

## 2020-01-09 NOTE — Assessment & Plan Note (Signed)
stable overall by history and exam, recent data reviewed with pt, and pt to continue medical treatment as before,  to f/u any worsening symptoms or concerns  

## 2020-01-13 ENCOUNTER — Encounter: Payer: Self-pay | Admitting: Endocrinology

## 2020-01-14 MED ORDER — DAPAGLIFLOZIN PROPANEDIOL 10 MG PO TABS
10.0000 mg | ORAL_TABLET | Freq: Every day | ORAL | 0 refills | Status: DC
Start: 2020-01-14 — End: 2020-03-28

## 2020-01-14 MED ORDER — DAPAGLIFLOZIN PROPANEDIOL 10 MG PO TABS
10.0000 mg | ORAL_TABLET | Freq: Every day | ORAL | 0 refills | Status: DC
Start: 2020-01-14 — End: 2020-01-14

## 2020-01-14 NOTE — Addendum Note (Signed)
Addended by: Caprice Beaver T on: 01/14/2020 08:18 AM   Modules accepted: Orders

## 2020-01-19 ENCOUNTER — Other Ambulatory Visit: Payer: Self-pay

## 2020-01-19 ENCOUNTER — Ambulatory Visit (INDEPENDENT_AMBULATORY_CARE_PROVIDER_SITE_OTHER): Payer: BC Managed Care – PPO | Admitting: Endocrinology

## 2020-01-19 ENCOUNTER — Encounter: Payer: Self-pay | Admitting: Endocrinology

## 2020-01-19 VITALS — BP 134/82 | HR 83 | Ht 75.0 in | Wt 232.4 lb

## 2020-01-19 DIAGNOSIS — E119 Type 2 diabetes mellitus without complications: Secondary | ICD-10-CM

## 2020-01-19 MED ORDER — RYBELSUS 14 MG PO TABS: 14.0000 mg | ORAL_TABLET | Freq: Every day | ORAL | 3 refills | Status: DC

## 2020-01-19 NOTE — Progress Notes (Signed)
Subjective:    Patient ID: William Sheppard, male    DOB: January 03, 1971, 49 y.o.   MRN: [id number]  HPI Pt returns for f/u of diabetes mellitus: DM type: 2 Dx'ed: 0998 Complications: none Therapy: 3 oral meds.   DKA: never, but he had nonketotic hyperosmolar hyperglycemic state in 2016.   Severe hypoglycemia: never Pancreatitis: never Pancreatic imaging: normal on 2000 CT Other: he has never been on insulin; pt says metformin causes diarrhea, so we are minimizing dosage.   Interval history: pt states he feels well in general, except for "stomach feeling full.".  Pt says he takes meds as rx'ed.  He does not check cbg's.  Past Medical History:  Diagnosis Date  . Chronic headaches   . Diabetes mellitus   . Hodgkin's lymphoma ([place])   . Hyperlipidemia     Past Surgical History:  Procedure Laterality Date  . [place]  . WISDOM TOOTH EXTRACTION      Social History   Socioeconomic History  . Marital status: Married    Spouse name: Not on file  . Number of children: Not on file  . Years of education: masters  . Highest education level: Not on file  Occupational History  . Occupation: Lobbyist: [employer]  Tobacco Use  . Smoking status: Never Smoker  . Smokeless tobacco: Never Used  Vaping Use  . Vaping Use: Never used  Substance and Sexual Activity  . Alcohol use: Yes    Comment: social  . Drug use: No  . Sexual activity: Not on file  Other Topics Concern  . Not on file  Social History Narrative  . Not on file   Social Determinants of Health   Financial Resource Strain: Not on file  Food Insecurity: Not on file  Transportation Needs: Not on file  Physical Activity: Not on file  Stress: Not on file  Social Connections: Not on file  Intimate Partner Violence: Not on file    Current Outpatient Medications on File Prior to Visit  Medication Sig Dispense Refill  . ALPRAZolam (XANAX) 1 MG tablet Take 1 tablet (1 mg total) by  mouth 4 (four) times daily as needed for anxiety. 20 tablet 0  . aspirin 81 MG EC tablet Take 1 tablet (81 mg total) by mouth daily. Swallow whole. 30 tablet 12  . dapagliflozin propanediol (FARXIGA) 10 MG TABS tablet Take 1 tablet (10 mg total) by mouth daily before breakfast. 90 tablet 0  . glucose blood test strip And lancets 1/day 250.00 100 each 12  . lisinopril (ZESTRIL) 5 MG tablet TAKE 1 TABLET DAILY 90 tablet 3  . metFORMIN (GLUCOPHAGE) 500 MG tablet Take 1 tablet (500 mg total) by mouth daily. 90 tablet 3  . pravastatin (PRAVACHOL) 40 MG tablet TAKE 1 TABLET DAILY 90 tablet 3  . tadalafil (CIALIS) 20 MG tablet Take 0.5-1 tablets (10-20 mg total) by mouth every other day as needed for erectile dysfunction. 5 tablet 11  . Semaglutide (RYBELSUS) 14 MG TABS Take 14 mg by mouth daily. 90 tablet 3   No current facility-administered medications on file prior to visit.    No Known Allergies  Family History  Adopted: Yes    BP 134/82   Pulse 83   Ht 6\' 3"  (1.905 m)   Wt 232 lb 6.4 oz (105.4 kg)   SpO2 96%   BMI 29.05 kg/m    Review of Systems Denies nausea.  Pt reports dry cough  Objective:   Physical Exam VITAL SIGNS:  See vs page GENERAL: no distress Pulses: dorsalis pedis intact bilat.   MSK: no deformity of the feet CV: no leg edema Skin:  no ulcer on the feet.  normal color and temp on the feet.   Neuro: sensation is intact to touch on the feet.   Lab Results  Component Value Date   HGBA1C 7.2 (A) 01/06/2020   Lab Results  Component Value Date   TSH 1.20 08/26/2019       Assessment & Plan:  Type 2 DM: uncontrolled.   Patient Instructions  check your blood sugar once a day.  vary the time of day when you check, between before the 3 meals, and at bedtime.  also check if you have symptoms of your blood sugar being too high or too low.  please keep a record of the readings and bring it to your next appointment here (or you can bring the meter itself).  You  can write it on any piece of paper.  please call us sooner if your blood sugar goes below 70, or if you have a lot of readings over 200. I have sent a prescription to your pharmacy, to double the Rybelsus. Please continue the same other diabetes medications.   Please see Dr Jenny Reichmann about the cough, as this may be caused by lisinopril.   Please come back for a follow-up appointment in 2-3 months.

## 2020-01-19 NOTE — Addendum Note (Signed)
Addended by: Caprice Beaver T on: 01/19/2020 01:18 PM   Modules accepted: Orders

## 2020-01-19 NOTE — Patient Instructions (Addendum)
check your blood sugar once a day.  vary the time of day when you check, between before the 3 meals, and at bedtime.  also check if you have symptoms of your blood sugar being too high or too low.  please keep a record of the readings and bring it to your next appointment here (or you can bring the meter itself).  You can write it on any piece of paper.  please call us sooner if your blood sugar goes below 70, or if you have a lot of readings over 200. I have sent a prescription to your pharmacy, to double the Rybelsus. Please continue the same other diabetes medications.   Please see Dr Jenny Reichmann about the cough, as this may be caused by lisinopril.   Please come back for a follow-up appointment in 2-3 months.

## 2020-03-28 ENCOUNTER — Other Ambulatory Visit: Payer: Self-pay | Admitting: Endocrinology

## 2020-04-21 ENCOUNTER — Other Ambulatory Visit: Payer: Self-pay

## 2020-04-25 ENCOUNTER — Ambulatory Visit (INDEPENDENT_AMBULATORY_CARE_PROVIDER_SITE_OTHER): Payer: BC Managed Care – PPO | Admitting: Endocrinology

## 2020-04-25 ENCOUNTER — Other Ambulatory Visit: Payer: Self-pay

## 2020-04-25 VITALS — BP 124/86 | HR 84 | Ht 76.0 in | Wt 221.4 lb

## 2020-04-25 DIAGNOSIS — E119 Type 2 diabetes mellitus without complications: Secondary | ICD-10-CM | POA: Diagnosis not present

## 2020-04-25 LAB — POCT GLYCOSYLATED HEMOGLOBIN (HGB A1C): Hemoglobin A1C: 7.4 % — AB (ref 4.0–5.6)

## 2020-04-25 MED ORDER — METFORMIN HCL 500 MG PO TABS: 500.0000 mg | ORAL_TABLET | Freq: Two times a day (BID) | ORAL | 3 refills | Status: DC

## 2020-04-25 NOTE — Patient Instructions (Addendum)
check your blood sugar once a day.  vary the time of day when you check, between before the 3 meals, and at bedtime.  also check if you have symptoms of your blood sugar being too high or too low.  please keep a record of the readings and bring it to your next appointment here (or you can bring the meter itself).  You can write it on any piece of paper.  please call us sooner if your blood sugar goes below 70, or if you have a lot of readings over 200. I have sent a prescription to your pharmacy, to double the metformin. Please continue the same other diabetes medications.     Please come back for a follow-up appointment in 2-3 months.

## 2020-04-25 NOTE — Progress Notes (Signed)
Subjective:    Patient ID: William Sheppard, male    DOB: [birth year], 50 y.o.   MRN: [id number]  HPI Pt returns for f/u of diabetes mellitus: DM type: 2 Dx'ed: 5284 Complications: none Therapy: 3 oral meds.   DKA: never, but he had nonketotic hyperosmolar hyperglycemic state in 2016.   Severe hypoglycemia: never Pancreatitis: never Pancreatic imaging: normal on 2000 CT Other: he has never been on insulin; pt says metformin causes diarrhea, so we are minimizing dosage.   Interval history: pt states he feels well in general, except for "stomach feeling full.".  Pt says he takes meds as rx'ed.  He does not check cbg's.  He has mild nausea.   Past Medical History:  Diagnosis Date  . Chronic headaches   . Diabetes mellitus   . Hodgkin's lymphoma ([place])   . Hyperlipidemia     Past Surgical History:  Procedure Laterality Date  . [place]  . WISDOM TOOTH EXTRACTION      Social History   Socioeconomic History  . Marital status: Married    Spouse name: Not on file  . Number of children: Not on file  . Years of education: masters  . Highest education level: Not on file  Occupational History  . Occupation: Lobbyist: [employer]  Tobacco Use  . Smoking status: Never Smoker  . Smokeless tobacco: Never Used  Vaping Use  . Vaping Use: Never used  Substance and Sexual Activity  . Alcohol use: Yes    Comment: social  . Drug use: No  . Sexual activity: Not on file  Other Topics Concern  . Not on file  Social History Narrative  . Not on file   Social Determinants of Health   Financial Resource Strain: Not on file  Food Insecurity: Not on file  Transportation Needs: Not on file  Physical Activity: Not on file  Stress: Not on file  Social Connections: Not on file  Intimate Partner Violence: Not on file    Current Outpatient Medications on File Prior to Visit  Medication Sig Dispense Refill  . ALPRAZolam (XANAX) 1 MG tablet Take 1  tablet (1 mg total) by mouth 4 (four) times daily as needed for anxiety. 20 tablet 0  . aspirin 81 MG EC tablet Take 1 tablet (81 mg total) by mouth daily. Swallow whole. 30 tablet 12  . FARXIGA 10 MG TABS tablet TAKE 1 TABLET DAILY BEFORE BREAKFAST 90 tablet 3  . glucose blood test strip And lancets 1/day 250.00 100 each 12  . lisinopril (ZESTRIL) 5 MG tablet TAKE 1 TABLET DAILY 90 tablet 3  . pravastatin (PRAVACHOL) 40 MG tablet TAKE 1 TABLET DAILY 90 tablet 3  . Semaglutide (RYBELSUS) 14 MG TABS Take 14 mg by mouth daily. 90 tablet 3  . tadalafil (CIALIS) 20 MG tablet Take 0.5-1 tablets (10-20 mg total) by mouth every other day as needed for erectile dysfunction. 5 tablet 11   No current facility-administered medications on file prior to visit.    No Known Allergies  Family History  Adopted: Yes    BP 124/86 (BP Location: Right Arm, Patient Position: Sitting, Cuff Size: Large)   Pulse 84   Ht 6\' 4"  (1.93 m)   Wt 221 lb 6.4 oz (100.4 kg)   SpO2 97%   BMI 26.95 kg/m    Review of Systems     Objective:   Physical Exam VITAL SIGNS:  See vs page GENERAL: no  distress Pulses: dorsalis pedis intact bilat.   MSK: no deformity of the feet CV: no leg edema Skin:  no ulcer on the feet.  normal color and temp on the feet.   Neuro: sensation is intact to touch on the feet.    Lab Results  Component Value Date   CREATININE 1.05 08/26/2019   BUN 17 08/26/2019   NA 139 08/26/2019   K 4.0 08/26/2019   CL 106 08/26/2019   CO2 26 08/26/2019   A1c=7.4%     Assessment & Plan:  Type 2 DM: uncontrolled   Patient Instructions  check your blood sugar once a day.  vary the time of day when you check, between before the 3 meals, and at bedtime.  also check if you have symptoms of your blood sugar being too high or too low.  please keep a record of the readings and bring it to your next appointment here (or you can bring the meter itself).  You can write it on any piece of paper.   please call us sooner if your blood sugar goes below 70, or if you have a lot of readings over 200. I have sent a prescription to your pharmacy, to double the metformin. Please continue the same other diabetes medications.     Please come back for a follow-up appointment in 2-3 months.

## 2020-05-02 ENCOUNTER — Ambulatory Visit (INDEPENDENT_AMBULATORY_CARE_PROVIDER_SITE_OTHER)
Admission: RE | Admit: 2020-05-02 | Discharge: 2020-05-02 | Disposition: A | Discharge status: Home / Self Care | Payer: BC Managed Care – PPO | Source: Ambulatory Visit | Attending: Family | Admitting: Family

## 2020-05-02 ENCOUNTER — Ambulatory Visit (INDEPENDENT_AMBULATORY_CARE_PROVIDER_SITE_OTHER): Payer: BC Managed Care – PPO | Admitting: Family

## 2020-05-02 ENCOUNTER — Encounter: Payer: Self-pay | Admitting: Family

## 2020-05-02 ENCOUNTER — Other Ambulatory Visit: Payer: Self-pay

## 2020-05-02 VITALS — BP 130/80 | HR 101 | Temp 98.5°F | Ht 75.0 in | Wt 218.6 lb

## 2020-05-02 DIAGNOSIS — R109 Unspecified abdominal pain: Secondary | ICD-10-CM

## 2020-05-02 DIAGNOSIS — Z8572 Personal history of non-Hodgkin lymphomas: Secondary | ICD-10-CM | POA: Diagnosis not present

## 2020-05-02 DIAGNOSIS — R3129 Other microscopic hematuria: Secondary | ICD-10-CM | POA: Diagnosis not present

## 2020-05-02 DIAGNOSIS — R252 Cramp and spasm: Secondary | ICD-10-CM | POA: Diagnosis not present

## 2020-05-02 DIAGNOSIS — M47819 Spondylosis without myelopathy or radiculopathy, site unspecified: Secondary | ICD-10-CM | POA: Diagnosis not present

## 2020-05-02 LAB — POCT URINALYSIS DIP (CLINITEK)
Bilirubin, UA: NEGATIVE
Glucose, UA: >=1000 mg/dL — AB
Leukocytes, UA: NEGATIVE
Nitrite, UA: NEGATIVE
POC PROTEIN,UA: 30 — AB
Spec Grav, UA: 1.02 (ref 1.010–1.025)
Urobilinogen, UA: 0.2 E.U./dL
pH, UA: 6 (ref 5.0–8.0)

## 2020-05-02 LAB — COMPREHENSIVE METABOLIC PANEL
ALT: 34 U/L (ref 0–53)
AST: 13 U/L (ref 0–37)
Albumin: 4.6 g/dL (ref 3.5–5.2)
Alkaline Phosphatase: 54 U/L (ref 39–117)
BUN: 18 mg/dL (ref 6–23)
CO2: 29 mEq/L (ref 19–32)
Calcium: 9.6 mg/dL (ref 8.4–10.5)
Chloride: 104 mEq/L (ref 96–112)
Creatinine, Ser: 1.38 mg/dL (ref 0.40–1.50)
GFR: 60.51 mL/min (ref >60.00)
Glucose, Bld: 110 mg/dL — ABNORMAL HIGH (ref 70–99)
Potassium: 4.3 mEq/L (ref 3.5–5.1)
Sodium: 140 mEq/L (ref 135–145)
Total Bilirubin: 0.3 mg/dL (ref 0.2–1.2)
Total Protein: 8.2 g/dL (ref 6.0–8.3)

## 2020-05-02 LAB — CBC WITH DIFFERENTIAL/PLATELET
Basophils Absolute: 0 10*3/uL (ref 0.0–0.1)
Basophils Relative: 0.2 % (ref 0.0–3.0)
Eosinophils Absolute: 0 10*3/uL (ref 0.0–0.7)
Eosinophils Relative: 0.5 % (ref 0.0–5.0)
HCT: 44.4 % (ref 39.0–52.0)
Hemoglobin: 14.1 g/dL (ref 13.0–17.0)
Lymphocytes Relative: 43.9 % (ref 12.0–46.0)
Lymphs Abs: 2.7 10*3/uL (ref 0.7–4.0)
MCHC: 31.7 g/dL (ref 30.0–36.0)
MCV: 69.7 fl — ABNORMAL LOW (ref 78.0–100.0)
Monocytes Absolute: 0.5 10*3/uL (ref 0.1–1.0)
Monocytes Relative: 8.9 % (ref 3.0–12.0)
Neutro Abs: 2.8 10*3/uL (ref 1.4–7.7)
Neutrophils Relative %: 46.5 % (ref 43.0–77.0)
Platelets: 208 10*3/uL (ref 150.0–400.0)
RBC: 6.36 Mil/uL — ABNORMAL HIGH (ref 4.22–5.81)
RDW: 15.4 % (ref 11.5–15.5)
WBC: 6.1 10*3/uL (ref 4.0–10.5)

## 2020-05-02 LAB — MAGNESIUM: Magnesium: 2.1 mg/dL (ref 1.5–2.5)

## 2020-05-02 MED ORDER — CYCLOBENZAPRINE HCL 10 MG PO TABS: 10.0000 mg | ORAL_TABLET | Freq: Three times a day (TID) | ORAL | 0 refills | Status: DC | PRN

## 2020-05-02 MED ORDER — MELOXICAM 15 MG PO TABS: 15.0000 mg | ORAL_TABLET | Freq: Every day | ORAL | 0 refills | Status: DC

## 2020-05-02 NOTE — Progress Notes (Signed)
William Sheppard is a 50 y.o. male with the following history as recorded in [employer]:  Patient Active Problem List   Diagnosis Date Noted  . Erectile dysfunction 01/09/2020  . Vitamin D deficiency 01/09/2020  . Right flank pain 12/08/2018  . Acute upper respiratory infection 02/21/2018  . Pain in right hand 10/09/2017  . Pain of right thumb 09/27/2017  . Acute right-sided low back pain without sciatica 12/07/2016  . Rapid palpitations 11/08/2016  . Neck pain on left side 11/08/2016  . Left hip pain 02/28/2016  . Atypical chest pain 12/20/2015  . Dizziness 07/21/2015  . Hodgkin's lymphoma ([place]) 03/11/2015  . Diabetes mellitus type II, non insulin dependent ([place]) 03/11/2015  . Thalassemia, alpha ([place]) 03/11/2015  . Acute sinus infection 01/19/2015  . 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 Low serum testosterone level 11/04/2013  . Numbness 11/03/2012  . Inadequate sleep hygiene 10/23/2012  . Daytime somnolence 09/18/2012  . Mid back pain on left side 05/31/2012  . Hyperlipidemia   . Preventative health care 05/05/2011    Current Outpatient Medications  Medication Sig Dispense Refill  . ALPRAZolam (XANAX) 1 MG tablet Take 1 tablet (1 mg total) by mouth 4 (four) times daily as needed for anxiety. 20 tablet 0  . aspirin 81 MG EC tablet Take 1 tablet (81 mg total) by mouth daily. Swallow whole. 30 tablet 12  . cyclobenzaprine (FLEXERIL) 10 MG tablet Take 1 tablet (10 mg total) by mouth 3 (three) times daily as needed for muscle spasms. 30 tablet 0  . FARXIGA 10 MG TABS tablet TAKE 1 TABLET DAILY BEFORE BREAKFAST 90 tablet 3  . glucose blood test strip And lancets 1/day 250.00 100 each 12  . lisinopril (ZESTRIL) 5 MG tablet TAKE 1 TABLET DAILY 90 tablet 3  . meloxicam (MOBIC) 15 MG tablet Take 1 tablet (15 mg total) by mouth daily. 30 tablet 0  . metFORMIN (GLUCOPHAGE) 500 MG tablet Take 1 tablet (500 mg total) by mouth 2 (two) times daily with a meal. 180 tablet 3  . pravastatin (PRAVACHOL)  40 MG tablet TAKE 1 TABLET DAILY 90 tablet 3  . Semaglutide (RYBELSUS) 14 MG TABS Take 14 mg by mouth daily. 90 tablet 3  . tadalafil (CIALIS) 20 MG tablet Take 0.5-1 tablets (10-20 mg total) by mouth every other day as needed for erectile dysfunction. 5 tablet 11   No current facility-administered medications for this visit.    Allergies: Patient has no known allergies.  Past Medical History:  Diagnosis Date  . Chronic headaches   . Diabetes mellitus   . Hodgkin's lymphoma ([place])   . Hyperlipidemia     Past Surgical History:  Procedure Laterality Date  . [place]  . WISDOM TOOTH EXTRACTION      Family History  Adopted: Yes    Social History   Tobacco Use  . Smoking status: Never Smoker  . Smokeless tobacco: Never Used  Substance Use Topics  . Alcohol use: Yes    Comment: social    Subjective:   Accompanied by his wife today; 1 week history of left sided flank pain; feels like "spasming" on the inside- feels different than with previous muscle spasms; has taken Mobic over the weekend with limited relief; no prior history of kidney stones; no blood in urine; has to walk in "specific" way to get relief from pain;   Objective:  Vitals:   05/02/20 1413  BP: 130/80  Pulse: (!) 101  Temp: 98.5 F (  36.9 C)  TempSrc: Oral  SpO2: 95%  Weight: 218 lb 9.6 oz (99.2 kg)  Height: '6\' 3"'  (1.905 m)    General: Well developed, well nourished, in no acute distress  Skin : Warm and dry.  Head: Normocephalic and atraumatic  Eyes: Sclera and conjunctiva clear; pupils round and reactive to light; extraocular movements intact  Ears: External normal; canals clear; tympanic membranes normal  Oropharynx: Pink, supple. No suspicious lesions  Neck: Supple without thyromegaly, adenopathy  Lungs: Respirations unlabored;  Musculoskeletal: No deformities; no active joint inflammation  Extremities: No edema, cyanosis, clubbing  Vessels: Symmetric bilaterally  Neurologic: Alert  and oriented; speech intact; face symmetrical; moves all extremities well; CNII-XII intact without focal deficit   Assessment:  1. Flank pain   2. Muscle cramps   3. Abdominal pain, unspecified abdominal location     Plan:  U/A done in office does show hematuria; based on presentation, kidney stone needs to be considered vs muscular source; check CBC, CMP today; check STAT renal stone CT; will also refill Mobic and Flexeril; follow-up to be determined;   This visit occurred during the SARS-CoV-2 public health emergency.  Safety protocols were in place, including screening questions prior to the visit, additional usage of staff PPE, and extensive cleaning of exam room while observing appropriate contact time as indicated for disinfecting solutions.     No follow-ups on file.  Orders Placed This Encounter  Procedures  . CT RENAL STONE STUDY    Standing Status:   Future    Number of Occurrences:   1    Standing Expiration Date:   05/02/2021    Order Specific Question:   Preferred imaging location?    Answer:   [facility]  . CBC with Differential/Platelet    Standing Status:   Future    Number of Occurrences:   1    Standing Expiration Date:   05/02/2021  . Comp Met (CMET)    Standing Status:   Future    Number of Occurrences:   1    Standing Expiration Date:   05/02/2021  . Magnesium    Standing Status:   Future    Number of Occurrences:   1    Standing Expiration Date:   05/02/2021  . POCT URINALYSIS DIP (CLINITEK)    Requested Prescriptions   Signed Prescriptions Disp Refills  . meloxicam (MOBIC) 15 MG tablet 30 tablet 0    Sig: Take 1 tablet (15 mg total) by mouth daily.  . cyclobenzaprine (FLEXERIL) 10 MG tablet 30 tablet 0    Sig: Take 1 tablet (10 mg total) by mouth 3 (three) times daily as needed for muscle spasms.

## 2020-05-03 ENCOUNTER — Other Ambulatory Visit: Payer: Self-pay | Admitting: Family

## 2020-05-03 ENCOUNTER — Other Ambulatory Visit (INDEPENDENT_AMBULATORY_CARE_PROVIDER_SITE_OTHER): Payer: BC Managed Care – PPO

## 2020-05-03 DIAGNOSIS — R319 Hematuria, unspecified: Secondary | ICD-10-CM

## 2020-05-03 DIAGNOSIS — R935 Abnormal findings on diagnostic imaging of other abdominal regions, including retroperitoneum: Secondary | ICD-10-CM

## 2020-05-03 NOTE — Progress Notes (Signed)
Reviewed with patient and wife; LFTs are normal but due to patient's medical history, will start with liver ultrasound and consider MRI if needed;

## 2020-05-03 NOTE — Progress Notes (Signed)
Reviewed with patient and wife.

## 2020-05-04 LAB — URINALYSIS, ROUTINE W REFLEX MICROSCOPIC
Bilirubin Urine: NEGATIVE
Ketones, ur: NEGATIVE
Leukocytes,Ua: NEGATIVE
Nitrite: NEGATIVE
Specific Gravity, Urine: 1.015 (ref 1.000–1.030)
Total Protein, Urine: NEGATIVE
Urine Glucose: 500 — AB
Urobilinogen, UA: 0.2 (ref 0.0–1.0)
pH: 6 (ref 5.0–8.0)

## 2020-05-18 ENCOUNTER — Encounter: Payer: Self-pay | Admitting: Internal Medicine

## 2020-05-18 MED ORDER — SILDENAFIL CITRATE 100 MG PO TABS: 50.0000 mg | ORAL_TABLET | Freq: Every day | ORAL | 11 refills | Status: DC | PRN

## 2020-05-23 ENCOUNTER — Ambulatory Visit
Admission: RE | Admit: 2020-05-23 | Discharge: 2020-05-23 | Disposition: A | Discharge status: Home / Self Care | Payer: BC Managed Care – PPO | Source: Ambulatory Visit | Attending: Family | Admitting: Family

## 2020-05-23 DIAGNOSIS — K760 Fatty (change of) liver, not elsewhere classified: Secondary | ICD-10-CM | POA: Diagnosis not present

## 2020-05-23 DIAGNOSIS — R935 Abnormal findings on diagnostic imaging of other abdominal regions, including retroperitoneum: Secondary | ICD-10-CM

## 2020-05-23 MED ORDER — SILDENAFIL CITRATE 100 MG PO TABS: 50.0000 mg | ORAL_TABLET | Freq: Every day | ORAL | 11 refills | Status: DC | PRN

## 2020-05-23 NOTE — Addendum Note (Signed)
Addended by: Biagio Borg on: 05/23/2020 09:00 PM   Modules accepted: Orders

## 2020-06-13 MED ORDER — TADALAFIL 20 MG PO TABS: 10.0000 mg | ORAL_TABLET | ORAL | 11 refills | Status: DC | PRN

## 2020-06-13 NOTE — Addendum Note (Signed)
Addended by: Biagio Borg on: 06/13/2020 06:10 PM   Modules accepted: Orders

## 2020-07-08 ENCOUNTER — Other Ambulatory Visit: Payer: Self-pay | Admitting: Internal Medicine

## 2020-07-08 NOTE — Telephone Encounter (Signed)
Please refill as per office routine med refill policy (all routine meds refilled for 3 mo or monthly per pt preference up to one year from last visit, then month to month grace period for 3 mo, then further med refills will have to be denied)  

## 2020-08-01 ENCOUNTER — Encounter: Payer: Self-pay | Admitting: Endocrinology

## 2020-08-01 ENCOUNTER — Other Ambulatory Visit: Payer: Self-pay

## 2020-08-01 ENCOUNTER — Ambulatory Visit (INDEPENDENT_AMBULATORY_CARE_PROVIDER_SITE_OTHER): Payer: BC Managed Care – PPO | Admitting: Endocrinology

## 2020-08-01 ENCOUNTER — Telehealth: Payer: Self-pay | Admitting: Endocrinology

## 2020-08-01 VITALS — BP 130/90 | Wt 221.0 lb

## 2020-08-01 DIAGNOSIS — R6889 Other general symptoms and signs: Secondary | ICD-10-CM

## 2020-08-01 DIAGNOSIS — E119 Type 2 diabetes mellitus without complications: Secondary | ICD-10-CM

## 2020-08-01 HISTORY — DX: Other general symptoms and signs: R68.89

## 2020-08-01 LAB — BASIC METABOLIC PANEL
BUN: 22 mg/dL (ref 6–23)
CO2: 25 mEq/L (ref 19–32)
Calcium: 9.7 mg/dL (ref 8.4–10.5)
Chloride: 104 mEq/L (ref 96–112)
Creatinine, Ser: 1.18 mg/dL (ref 0.40–1.50)
GFR: 72.88 mL/min (ref >60.00)
Glucose, Bld: 119 mg/dL — ABNORMAL HIGH (ref 70–99)
Potassium: 3.9 mEq/L (ref 3.5–5.1)
Sodium: 139 mEq/L (ref 135–145)

## 2020-08-01 LAB — POCT GLYCOSYLATED HEMOGLOBIN (HGB A1C): Hemoglobin A1C: 7.8 % — AB (ref 4.0–5.6)

## 2020-08-01 LAB — TSH: TSH: 1.83 u[IU]/mL (ref 0.35–4.50)

## 2020-08-01 LAB — T4, FREE: Free T4: 0.69 ng/dL (ref 0.60–1.60)

## 2020-08-01 MED ORDER — RYBELSUS 7 MG PO TABS: 7.0000 mg | ORAL_TABLET | Freq: Every day | ORAL | 3 refills | Status: DC

## 2020-08-01 NOTE — Progress Notes (Signed)
Subjective:    Patient ID: William Sheppard, male    DOB: [birth year], 50 y.o.   MRN: [id number]  HPI Pt returns for f/u of diabetes mellitus:  DM type: 2 Dx'ed: 1601 Complications: stage 2 CRI.  Therapy: 3 oral meds.   DKA: never, but he had nonketotic hyperosmolar hyperglycemic state in 2016.   Severe hypoglycemia: never Pancreatitis: never Pancreatic imaging: normal on 2000 CT.   Other: he has never been on insulin; pt says metformin causes diarrhea, so we are minimizing dosage.   Interval history: Pt says he takes meds as rx'ed.  He does not check cbg's.  He reports cold intolerance.  Also feels "dehydrated."  He takes either the Rybelsus and [place].   Past Medical History:  Diagnosis Date   Chronic headaches    Diabetes mellitus    Hodgkin's lymphoma ([place])    Hyperlipidemia     Past Surgical History:  Procedure Laterality Date   PORTACATH PLACEMENT  1999   WISDOM TOOTH EXTRACTION      Social History   Socioeconomic History   Marital status: Married    Spouse name: Not on file   Number of children: Not on file   Years of education: masters   Highest education level: Not on file  Occupational History   Occupation: Lobbyist: [employer]  Tobacco Use   Smoking status: Never   Smokeless tobacco: Never  Vaping Use   Vaping Use: Never used  Substance and Sexual Activity   Alcohol use: Yes    Comment: social   Drug use: No   Sexual activity: Not on file  Other Topics Concern   Not on file  Social History Narrative   Not on file   Social Determinants of Health   Financial Resource Strain: Not on file  Food Insecurity: Not on file  Transportation Needs: Not on file  Physical Activity: Not on file  Stress: Not on file  Social Connections: Not on file  Intimate Partner Violence: Not on file    Current Outpatient Medications on File Prior to Visit  Medication Sig Dispense Refill   ALPRAZolam (XANAX) 1 MG tablet Take 1 tablet (1 mg total)  by mouth 4 (four) times daily as needed for anxiety. 20 tablet 0   aspirin 81 MG EC tablet Take 1 tablet (81 mg total) by mouth daily. Swallow whole. 30 tablet 12   cyclobenzaprine (FLEXERIL) 10 MG tablet Take 1 tablet (10 mg total) by mouth 3 (three) times daily as needed for muscle spasms. 30 tablet 0   FARXIGA 10 MG TABS tablet TAKE 1 TABLET DAILY BEFORE BREAKFAST 90 tablet 3   glucose blood test strip And lancets 1/day 250.00 100 each 12   lisinopril (ZESTRIL) 5 MG tablet TAKE 1 TABLET DAILY 90 tablet 3   meloxicam (MOBIC) 15 MG tablet Take 1 tablet (15 mg total) by mouth daily. 30 tablet 0   metFORMIN (GLUCOPHAGE) 500 MG tablet Take 1 tablet (500 mg total) by mouth 2 (two) times daily with a meal. 180 tablet 3   pravastatin (PRAVACHOL) 40 MG tablet TAKE 1 TABLET DAILY 90 tablet 3   tadalafil (CIALIS) 20 MG tablet Take 0.5-1 tablets (10-20 mg total) by mouth every other day as needed for erectile dysfunction. 5 tablet 11   No current facility-administered medications on file prior to visit.    No Known Allergies  Family History  Adopted: Yes    BP 130/90   Wt 221  lb (100.2 kg)   BMI 27.62 kg/m    Review of Systems     Objective:   Physical Exam VITAL SIGNS: limited due to lack of staff.   GENERAL: no distress Pulses: dorsalis pedis intact bilat.   MSK: no deformity of the feet CV: no leg edema Skin:  no ulcer on the feet.  normal color and temp on the feet. Neuro: sensation is intact to touch on the feet.    A1c=7.8%    Assessment & Plan:  Type 2 DM: uncontrolled.  He declines to add another med.  Sensation of dehydration, uncertain etiology. Cold intolerance, uncertain etiology.  Patient Instructions  check your blood sugar once a day.  vary the time of day when you check, between before the 3 meals, and at bedtime.  also check if you have symptoms of your blood sugar being too high or too low.  please keep a record of the readings and bring it to your next  appointment here (or you can bring the meter itself).  You can write it on any piece of paper.  please call us sooner if your blood sugar goes below 70, or if you have a lot of readings over 200.   I have sent a prescription to your pharmacy, to reduce the Rybelsus.   Please continue the same other diabetes medications.   Please come back for a follow-up appointment in 3 months.

## 2020-08-01 NOTE — Telephone Encounter (Signed)
Patient requests to transfer care from Dr. Loanne Drilling. Patient does not a preference. Please advise

## 2020-08-01 NOTE — Patient Instructions (Addendum)
check your blood sugar once a day.  vary the time of day when you check, between before the 3 meals, and at bedtime.  also check if you have symptoms of your blood sugar being too high or too low.  please keep a record of the readings and bring it to your next appointment here (or you can bring the meter itself).  You can write it on any piece of paper.  please call us sooner if your blood sugar goes below 70, or if you have a lot of readings over 200.   I have sent a prescription to your pharmacy, to reduce the Rybelsus.   Please continue the same other diabetes medications.   Please come back for a follow-up appointment in 3 months.

## 2020-11-01 ENCOUNTER — Ambulatory Visit (INDEPENDENT_AMBULATORY_CARE_PROVIDER_SITE_OTHER): Payer: BC Managed Care – PPO | Admitting: Endocrinology

## 2020-11-01 ENCOUNTER — Other Ambulatory Visit: Payer: Self-pay

## 2020-11-01 VITALS — BP 150/80 | HR 86 | Ht 75.0 in | Wt 222.6 lb

## 2020-11-01 DIAGNOSIS — E119 Type 2 diabetes mellitus without complications: Secondary | ICD-10-CM

## 2020-11-01 LAB — POCT GLYCOSYLATED HEMOGLOBIN (HGB A1C): Hemoglobin A1C: 8.6 % — AB (ref 4.0–5.6)

## 2020-11-01 MED ORDER — METFORMIN HCL ER 500 MG PO TB24: 1000.0000 mg | ORAL_TABLET | Freq: Every day | ORAL | 3 refills | Status: DC

## 2020-11-01 MED ORDER — RYBELSUS 14 MG PO TABS: 14.0000 mg | ORAL_TABLET | Freq: Every day | ORAL | 3 refills | Status: DC

## 2020-11-01 NOTE — Progress Notes (Signed)
Subjective:    Patient ID: William Sheppard, male    DOB: [birth year], 50 y.o.   MRN: [id number]  HPI Pt returns for f/u of diabetes mellitus:  DM type: 2 Dx'ed: 5631 Complications: stage 2 CRI.  Therapy: 3 oral meds.   DKA: never, but he had nonketotic hyperosmolar hyperglycemic state in 2016.   Severe hypoglycemia: never Pancreatitis: never Pancreatic imaging: normal on 2000 CT.   SDOH: He does not check cbg's.  Other: he has never been on insulin; pt says metformin dosage is limited by diarrhea.  Interval history: Pt says he takes meds as rx'ed.  pt states he feels well in general. Pt says he is very active.   Past Medical History:  Diagnosis Date   Chronic headaches    Diabetes mellitus    Hodgkin's lymphoma ([place])    Hyperlipidemia     Past Surgical History:  Procedure Laterality Date   PORTACATH PLACEMENT  1999   WISDOM TOOTH EXTRACTION      Social History   Socioeconomic History   Marital status: Married    Spouse name: Not on file   Number of children: Not on file   Years of education: masters   Highest education level: Not on file  Occupational History   Occupation: Lobbyist: [employer]  Tobacco Use   Smoking status: Never   Smokeless tobacco: Never  Vaping Use   Vaping Use: Never used  Substance and Sexual Activity   Alcohol use: Yes    Comment: social   Drug use: No   Sexual activity: Not on file  Other Topics Concern   Not on file  Social History Narrative   Not on file   Social Determinants of Health   Financial Resource Strain: Not on file  Food Insecurity: Not on file  Transportation Needs: Not on file  Physical Activity: Not on file  Stress: Not on file  Social Connections: Not on file  Intimate Partner Violence: Not on file    Current Outpatient Medications on File Prior to Visit  Medication Sig Dispense Refill   ALPRAZolam (XANAX) 1 MG tablet Take 1 tablet (1 mg total) by mouth 4 (four) times daily as needed for  anxiety. 20 tablet 0   aspirin 81 MG EC tablet Take 1 tablet (81 mg total) by mouth daily. Swallow whole. 30 tablet 12   cyclobenzaprine (FLEXERIL) 10 MG tablet Take 1 tablet (10 mg total) by mouth 3 (three) times daily as needed for muscle spasms. 30 tablet 0   FARXIGA 10 MG TABS tablet TAKE 1 TABLET DAILY BEFORE BREAKFAST 90 tablet 3   glucose blood test strip And lancets 1/day 250.00 100 each 12   lisinopril (ZESTRIL) 5 MG tablet TAKE 1 TABLET DAILY 90 tablet 3   meloxicam (MOBIC) 15 MG tablet Take 1 tablet (15 mg total) by mouth daily. 30 tablet 0   pravastatin (PRAVACHOL) 40 MG tablet TAKE 1 TABLET DAILY 90 tablet 3   tadalafil (CIALIS) 20 MG tablet Take 0.5-1 tablets (10-20 mg total) by mouth every other day as needed for erectile dysfunction. 5 tablet 11   No current facility-administered medications on file prior to visit.    No Known Allergies  Family History  Adopted: Yes    BP (!) 150/80 (BP Location: Right Arm, Patient Position: Sitting, Cuff Size: Normal)   Pulse 86   Ht 6\' 3"  (1.905 m)   Wt 222 lb 9.6 oz (101 kg)   SpO2  98%   BMI 27.82 kg/m    Review of Systems Denies N/V/HB    Objective:   Physical Exam Pulses: dorsalis pedis intact bilat.   MSK: no deformity of the feet CV: no leg edema Skin:  no ulcer on the feet.  normal color and temp on the feet. Neuro: sensation is intact to touch on the feet.    A1c=8.6%    Assessment & Plan:  Type 2 DM: uncontrolled.   Patient Instructions  check your blood sugar once a day.  vary the time of day when you check, between before the 3 meals, and at bedtime.  also check if you have symptoms of your blood sugar being too high or too low.  please keep a record of the readings and bring it to your next appointment here (or you can bring the meter itself).  You can write it on any piece of paper.  please call us sooner if your blood sugar goes below 70, or if you have a lot of readings over 200.   I have sent a  prescription to your pharmacy, to increase the Rybelsus.   Please continue the same other diabetes medications.   Please come back for a follow-up appointment in 4 months.

## 2020-11-01 NOTE — Patient Instructions (Addendum)
check your blood sugar once a day.  vary the time of day when you check, between before the 3 meals, and at bedtime.  also check if you have symptoms of your blood sugar being too high or too low.  please keep a record of the readings and bring it to your next appointment here (or you can bring the meter itself).  You can write it on any piece of paper.  please call us sooner if your blood sugar goes below 70, or if you have a lot of readings over 200.   I have sent a prescription to your pharmacy, to increase the Rybelsus.   Please continue the same other diabetes medications.   Please come back for a follow-up appointment in 4 months.

## 2021-02-04 ENCOUNTER — Observation Stay (HOSPITAL_BASED_OUTPATIENT_CLINIC_OR_DEPARTMENT_OTHER)
Admission: EM | Admit: 2021-02-04 | Discharge: 2021-02-06 | Disposition: A | Discharge status: Home / Self Care | Payer: Self-pay | Attending: Internal Medicine | Admitting: Internal Medicine

## 2021-02-04 ENCOUNTER — Encounter (HOSPITAL_BASED_OUTPATIENT_CLINIC_OR_DEPARTMENT_OTHER): Payer: Self-pay | Admitting: Emergency Medicine

## 2021-02-04 ENCOUNTER — Other Ambulatory Visit: Payer: Self-pay

## 2021-02-04 ENCOUNTER — Emergency Department (HOSPITAL_BASED_OUTPATIENT_CLINIC_OR_DEPARTMENT_OTHER): Payer: Self-pay

## 2021-02-04 DIAGNOSIS — N179 Acute kidney failure, unspecified: Secondary | ICD-10-CM

## 2021-02-04 DIAGNOSIS — R059 Cough, unspecified: Secondary | ICD-10-CM | POA: Diagnosis not present

## 2021-02-04 DIAGNOSIS — Z7982 Long term (current) use of aspirin: Secondary | ICD-10-CM | POA: Insufficient documentation

## 2021-02-04 DIAGNOSIS — E1110 Type 2 diabetes mellitus with ketoacidosis without coma: Principal | ICD-10-CM | POA: Insufficient documentation

## 2021-02-04 DIAGNOSIS — Z20822 Contact with and (suspected) exposure to covid-19: Secondary | ICD-10-CM | POA: Insufficient documentation

## 2021-02-04 DIAGNOSIS — Z7984 Long term (current) use of oral hypoglycemic drugs: Secondary | ICD-10-CM | POA: Insufficient documentation

## 2021-02-04 DIAGNOSIS — E119 Type 2 diabetes mellitus without complications: Secondary | ICD-10-CM

## 2021-02-04 DIAGNOSIS — Z79899 Other long term (current) drug therapy: Secondary | ICD-10-CM | POA: Insufficient documentation

## 2021-02-04 DIAGNOSIS — E875 Hyperkalemia: Secondary | ICD-10-CM | POA: Insufficient documentation

## 2021-02-04 HISTORY — DX: Acute kidney failure, unspecified: N17.9

## 2021-02-04 HISTORY — DX: Type 2 diabetes mellitus with ketoacidosis without coma: E11.10

## 2021-02-04 LAB — BASIC METABOLIC PANEL
Anion gap: 17 — ABNORMAL HIGH (ref 5–15)
Anion gap: 9 (ref 5–15)
BUN: 40 mg/dL — ABNORMAL HIGH (ref 6–20)
BUN: 34 mg/dL — ABNORMAL HIGH (ref 6–20)
CO2: 16 mmol/L — ABNORMAL LOW (ref 22–32)
CO2: 24 mmol/L (ref 22–32)
Calcium: 9.4 mg/dL (ref 8.9–10.3)
Calcium: 9.8 mg/dL (ref 8.9–10.3)
Chloride: 106 mmol/L (ref 98–111)
Chloride: 115 mmol/L — ABNORMAL HIGH (ref 98–111)
Creatinine, Ser: 1.82 mg/dL — ABNORMAL HIGH (ref 0.61–1.24)
Creatinine, Ser: 1.45 mg/dL — ABNORMAL HIGH (ref 0.61–1.24)
GFR, Estimated: 45 mL/min — ABNORMAL LOW (ref >60)
GFR, Estimated: 59 mL/min — ABNORMAL LOW (ref >60)
Glucose, Bld: 714 mg/dL (ref 70–99)
Glucose, Bld: 236 mg/dL — ABNORMAL HIGH (ref 70–99)
Potassium: 4.4 mmol/L (ref 3.5–5.1)
Potassium: 3.9 mmol/L (ref 3.5–5.1)
Sodium: 139 mmol/L (ref 135–145)
Sodium: 148 mmol/L — ABNORMAL HIGH (ref 135–145)

## 2021-02-04 LAB — COMPREHENSIVE METABOLIC PANEL
ALT: 30 U/L (ref 0–44)
AST: 21 U/L (ref 15–41)
Albumin: 4.9 g/dL (ref 3.5–5.0)
Alkaline Phosphatase: 100 U/L (ref 38–126)
Anion gap: 23 — ABNORMAL HIGH (ref 5–15)
BUN: 47 mg/dL — ABNORMAL HIGH (ref 6–20)
CO2: 16 mmol/L — ABNORMAL LOW (ref 22–32)
Calcium: 10.3 mg/dL (ref 8.9–10.3)
Chloride: 87 mmol/L — ABNORMAL LOW (ref 98–111)
Creatinine, Ser: 2.45 mg/dL — ABNORMAL HIGH (ref 0.61–1.24)
GFR, Estimated: 31 mL/min — ABNORMAL LOW (ref >60)
Glucose, Bld: 1379 mg/dL (ref 70–99)
Potassium: 6.8 mmol/L (ref 3.5–5.1)
Sodium: 126 mmol/L — ABNORMAL LOW (ref 135–145)
Total Bilirubin: 1.6 mg/dL — ABNORMAL HIGH (ref 0.3–1.2)
Total Protein: 10 g/dL — ABNORMAL HIGH (ref 6.5–8.1)

## 2021-02-04 LAB — URINALYSIS, MICROSCOPIC (REFLEX)

## 2021-02-04 LAB — BETA-HYDROXYBUTYRIC ACID
Beta-Hydroxybutyric Acid: 6.09 mmol/L — ABNORMAL HIGH (ref 0.05–0.27)
Beta-Hydroxybutyric Acid: 1.49 mmol/L — ABNORMAL HIGH (ref 0.05–0.27)

## 2021-02-04 LAB — GLUCOSE, CAPILLARY
Glucose-Capillary: 186 mg/dL — ABNORMAL HIGH (ref 70–99)
Glucose-Capillary: 196 mg/dL — ABNORMAL HIGH (ref 70–99)
Glucose-Capillary: 203 mg/dL — ABNORMAL HIGH (ref 70–99)
Glucose-Capillary: 215 mg/dL — ABNORMAL HIGH (ref 70–99)
Glucose-Capillary: 227 mg/dL — ABNORMAL HIGH (ref 70–99)
Glucose-Capillary: 231 mg/dL — ABNORMAL HIGH (ref 70–99)

## 2021-02-04 LAB — URINALYSIS, ROUTINE W REFLEX MICROSCOPIC
Bilirubin Urine: NEGATIVE
Glucose, UA: >=500 mg/dL — AB
Ketones, ur: 40 mg/dL — AB
Leukocytes,Ua: NEGATIVE
Nitrite: NEGATIVE
Protein, ur: NEGATIVE mg/dL
Specific Gravity, Urine: 1.01 (ref 1.005–1.030)
pH: 5 (ref 5.0–8.0)

## 2021-02-04 LAB — I-STAT VENOUS BLOOD GAS, ED
Acid-base deficit: 9 mmol/L — ABNORMAL HIGH (ref 0.0–2.0)
Bicarbonate: 19.2 mmol/L — ABNORMAL LOW (ref 20.0–28.0)
Calcium, Ion: 1.28 mmol/L (ref 1.15–1.40)
HCT: 54 % — ABNORMAL HIGH (ref 39.0–52.0)
Hemoglobin: 18.4 g/dL — ABNORMAL HIGH (ref 13.0–17.0)
O2 Saturation: 53 %
Patient temperature: 37
Potassium: 6.3 mmol/L (ref 3.5–5.1)
Sodium: 126 mmol/L — ABNORMAL LOW (ref 135–145)
TCO2: 21 mmol/L — ABNORMAL LOW (ref 22–32)
pCO2, Ven: 49.7 mmHg (ref 44.0–60.0)
pH, Ven: 7.195 — CL (ref 7.250–7.430)
pO2, Ven: 35 mmHg (ref 32.0–45.0)

## 2021-02-04 LAB — CBG MONITORING, ED
Glucose-Capillary: >600 mg/dL (ref 70–99)
Glucose-Capillary: >600 mg/dL (ref 70–99)
Glucose-Capillary: >600 mg/dL (ref 70–99)
Glucose-Capillary: >600 mg/dL (ref 70–99)
Glucose-Capillary: >600 mg/dL (ref 70–99)
Glucose-Capillary: 529 mg/dL (ref 70–99)
Glucose-Capillary: 371 mg/dL — ABNORMAL HIGH (ref 70–99)
Glucose-Capillary: 450 mg/dL — ABNORMAL HIGH (ref 70–99)
Glucose-Capillary: 297 mg/dL — ABNORMAL HIGH (ref 70–99)

## 2021-02-04 LAB — DIFFERENTIAL
Abs Immature Granulocytes: 0.05 10*3/uL (ref 0.00–0.07)
Basophils Absolute: 0 10*3/uL (ref 0.0–0.1)
Basophils Relative: 0 %
Eosinophils Absolute: 0 10*3/uL (ref 0.0–0.5)
Eosinophils Relative: 0 %
Immature Granulocytes: 1 %
Lymphocytes Relative: 6 %
Lymphs Abs: 0.4 10*3/uL — ABNORMAL LOW (ref 0.7–4.0)
Monocytes Absolute: 0.2 10*3/uL (ref 0.1–1.0)
Monocytes Relative: 4 %
Neutro Abs: 5.5 10*3/uL (ref 1.7–7.7)
Neutrophils Relative %: 89 %

## 2021-02-04 LAB — CBC
HCT: 50.9 % (ref 39.0–52.0)
Hemoglobin: 14.9 g/dL (ref 13.0–17.0)
MCH: 21.7 pg — ABNORMAL LOW (ref 26.0–34.0)
MCHC: 29.3 g/dL — ABNORMAL LOW (ref 30.0–36.0)
MCV: 74.2 fL — ABNORMAL LOW (ref 80.0–100.0)
Platelets: 229 10*3/uL (ref 150–400)
RBC: 6.86 MIL/uL — ABNORMAL HIGH (ref 4.22–5.81)
RDW: 18 % — ABNORMAL HIGH (ref 11.5–15.5)
WBC: 6.2 10*3/uL (ref 4.0–10.5)
nRBC: 0 % (ref 0.0–0.2)

## 2021-02-04 LAB — RESP PANEL BY RT-PCR (FLU A&B, COVID) ARPGX2
Influenza A by PCR: NEGATIVE
Influenza B by PCR: NEGATIVE
SARS Coronavirus 2 by RT PCR: NEGATIVE

## 2021-02-04 LAB — MRSA NEXT GEN BY PCR, NASAL: MRSA by PCR Next Gen: NOT DETECTED

## 2021-02-04 MED ORDER — SODIUM ZIRCONIUM CYCLOSILICATE 10 G PO PACK
10.0000 g | PACK | Freq: Once | ORAL | Status: AC
  Administered 2021-02-04: 12:00:00 10 g via ORAL
  Filled 2021-02-04: qty 1

## 2021-02-04 MED ORDER — ENOXAPARIN SODIUM 40 MG/0.4ML IJ SOSY
40.0000 mg | PREFILLED_SYRINGE | INTRAMUSCULAR | Status: DC
  Administered 2021-02-04 – 2021-02-05 (×2): 40 mg via SUBCUTANEOUS
  Filled 2021-02-04 (×2): qty 0.4

## 2021-02-04 MED ORDER — CHLORHEXIDINE GLUCONATE CLOTH 2 % EX PADS
6.0000 | MEDICATED_PAD | Freq: Every day | CUTANEOUS | Status: DC
  Administered 2021-02-04 – 2021-02-05 (×2): 6 via TOPICAL

## 2021-02-04 MED ORDER — SODIUM CHLORIDE 0.9 % IV SOLN: INTRAVENOUS | Status: DC

## 2021-02-04 MED ORDER — SODIUM CHLORIDE 0.9 % IV BOLUS
1000.0000 mL | Freq: Once | INTRAVENOUS | Status: AC
  Administered 2021-02-04: 09:00:00 1000 mL via INTRAVENOUS

## 2021-02-04 MED ORDER — ONDANSETRON HCL 4 MG/2ML IJ SOLN: 4.0000 mg | Freq: Four times a day (QID) | INTRAMUSCULAR | Status: DC | PRN

## 2021-02-04 MED ORDER — LACTATED RINGERS IV SOLN: INTRAVENOUS | Status: DC

## 2021-02-04 MED ORDER — DEXTROSE IN LACTATED RINGERS 5 % IV SOLN: INTRAVENOUS | Status: DC

## 2021-02-04 MED ORDER — CALCIUM GLUCONATE-NACL 1-0.675 GM/50ML-% IV SOLN
1.0000 g | Freq: Once | INTRAVENOUS | Status: AC
  Administered 2021-02-04: 12:00:00 1000 mg via INTRAVENOUS
  Filled 2021-02-04: qty 50

## 2021-02-04 MED ORDER — SODIUM CHLORIDE 0.9 % IV SOLN: INTRAVENOUS | Status: DC | PRN

## 2021-02-04 MED ORDER — INSULIN REGULAR(HUMAN) IN NACL 100-0.9 UT/100ML-% IV SOLN
INTRAVENOUS | Status: DC
  Administered 2021-02-04: 20:00:00 5 [IU]/h via INTRAVENOUS
  Administered 2021-02-04: 11:00:00 11 [IU]/h via INTRAVENOUS
  Filled 2021-02-04 (×2): qty 100

## 2021-02-04 MED ORDER — DEXTROSE 50 % IV SOLN: 0.0000 mL | INTRAVENOUS | Status: DC | PRN

## 2021-02-04 MED ORDER — INSULIN REGULAR(HUMAN) IN NACL 100-0.9 UT/100ML-% IV SOLN: INTRAVENOUS | Status: DC

## 2021-02-04 MED ORDER — SODIUM CHLORIDE 0.9 % IV BOLUS
1000.0000 mL | Freq: Once | INTRAVENOUS | Status: AC
  Administered 2021-02-04: 10:00:00 1000 mL via INTRAVENOUS

## 2021-02-04 NOTE — H&P (Signed)
History and Physical    William Sheppard QMV:[id number] DOB: 1970/05/05 DOA: 02/04/2021  PCP: Biagio Borg, MD   Patient coming from: Home  I have personally briefly reviewed patient's old medical records in [place]  CC: fatigue, polyuria HPI: 50 year old African-American male with a history of type 2 diabetes non-insulin-dependent, prior history of lymphoma who presents to the ER with a 2-week history of dysgeusia.  Patient states that everything he eats or drinks taste like cardboard.  His wife is noticed that he has lost tremendous amount of weight in the last few days.  Patient normally drinks about a gallon of water a day but has been having polydipsia and polyuria for about 2 weeks.  Patient does not check his sugars at all.  He knows when he is feeling hypoglycemic but has never noted hyperglycemia.  No fever no chills.  No nausea vomiting or diarrhea.  On arrival to the ER, patient tachycardic with a heart rate 124 blood pressure 139/91 satting 92% on room air.  Initial lab work VBG pH 7.19 PCO2 of 49.  Serum glucose of 1379, potassium 6.8, BUN of 47 creatinine 2.45.  Bicarbonate of 16.  Beta hydroxybutyrate was 1.49.  Patient diagnosed with DKA started on insulin drip.  Patient transferred to [facility] for further care.   ED Course:  Labs consistent with DKA.  Started on insulin drip.  Review of Systems:  Review of Systems  Constitutional:  Positive for malaise/fatigue and weight loss. Negative for chills and fever.  HENT: Negative.    Eyes:  Positive for blurred vision.  Respiratory: Negative.    Cardiovascular: Negative.   Gastrointestinal:  Negative for abdominal pain, heartburn, nausea and vomiting.  Genitourinary: Negative.   Musculoskeletal: Negative.   Skin: Negative.   Neurological:  Positive for weakness.  Endo/Heme/Allergies:  Positive for polydipsia.       Polyuria  Psychiatric/Behavioral: Negative.    All other systems reviewed and are  negative.  Past Medical History:  Diagnosis Date   Chronic headaches    Diabetes mellitus    Hodgkin's lymphoma ([place])    Hyperlipidemia     Past Surgical History:  Procedure Laterality Date   PORTACATH PLACEMENT  37   WISDOM TOOTH EXTRACTION       reports that he has never smoked. He has never used smokeless tobacco. He reports current alcohol use. He reports that he does not use drugs.  No Known Allergies  Family History  Adopted: Yes    Prior to Admission medications   Medication Sig Start Date End Date Taking? Authorizing Provider  aspirin 81 MG EC tablet Take 1 tablet (81 mg total) by mouth daily. Swallow whole. 05/07/12  Yes Biagio Borg, MD  FARXIGA 10 MG TABS tablet TAKE 1 TABLET DAILY BEFORE BREAKFAST Patient taking differently: 10 mg daily. 03/28/20  Yes Renato Shin, MD  lisinopril (ZESTRIL) 5 MG tablet TAKE 1 TABLET DAILY Patient taking differently: Take 5 mg by mouth daily. TAKE 1 TABLET DAILY 07/08/20  Yes Biagio Borg, MD  metFORMIN (GLUCOPHAGE-XR) 500 MG 24 hr tablet Take 2 tablets (1,000 mg total) by mouth daily. Patient taking differently: Take 500 mg by mouth daily. 11/01/20  Yes Renato Shin, MD  pravastatin (PRAVACHOL) 40 MG tablet TAKE 1 TABLET DAILY Patient taking differently: Take 40 mg by mouth daily. TAKE 1 TABLET DAILY 07/08/20  Yes Biagio Borg, MD  Semaglutide (RYBELSUS) 14 MG TABS Take 14 mg by mouth daily. 11/01/20  Yes Renato Shin, MD  ALPRAZolam Duanne Moron) 1 MG tablet Take 1 tablet (1 mg total) by mouth 4 (four) times daily as needed for anxiety. Patient not taking: Reported on 02/04/2021 03/26/18   Biagio Borg, MD  cyclobenzaprine (FLEXERIL) 10 MG tablet Take 1 tablet (10 mg total) by mouth 3 (three) times daily as needed for muscle spasms. Patient not taking: Reported on 02/04/2021 05/02/20   Marrian Salvage, FNP  glucose blood test strip And lancets 1/day 250.00 12/29/15   Biagio Borg, MD  meloxicam (MOBIC) 15 MG tablet Take 1  tablet (15 mg total) by mouth daily. Patient not taking: Reported on 02/04/2021 05/02/20   Marrian Salvage, FNP  tadalafil (CIALIS) 20 MG tablet Take 0.5-1 tablets (10-20 mg total) by mouth every other day as needed for erectile dysfunction. Patient not taking: Reported on 02/04/2021 06/13/20   Biagio Borg, MD    Physical Exam: Vitals:   02/04/21 1600 02/04/21 1705 02/04/21 1807 02/04/21 1900  BP:  (!) 126/95 135/83   Pulse: (!) 114 (!) 119 (!) 116   Resp: 16 12 15    Temp:   98.2 F (36.8 C) (!) 97.5 F (36.4 C)  TempSrc:   Oral Oral  SpO2: 94% 97% 96%   Weight:   84.8 kg   Height:   6\' 2"  (1.88 m)     Physical Exam Vitals and nursing note reviewed.  Constitutional:      General: He is not in acute distress.    Appearance: Normal appearance. He is not ill-appearing, toxic-appearing or diaphoretic.  HENT:     Head: Normocephalic and atraumatic.     Nose: Nose normal. No rhinorrhea.  Eyes:     General:        Right eye: No discharge.        Left eye: No discharge.  Cardiovascular:     Rate and Rhythm: Regular rhythm. Tachycardia present.     Pulses: Normal pulses.  Pulmonary:     Effort: Pulmonary effort is normal. No respiratory distress.     Breath sounds: Normal breath sounds. No wheezing or rales.  Abdominal:     General: Abdomen is flat. Bowel sounds are normal. There is no distension.     Tenderness: There is no abdominal tenderness. There is no guarding or rebound.  Musculoskeletal:     Right lower leg: No edema.     Left lower leg: No edema.  Skin:    General: Skin is warm and dry.     Capillary Refill: Capillary refill takes less than 2 seconds.  Neurological:     General: No focal deficit present.     Mental Status: He is alert and oriented to person, place, and time.     Labs on Admission: I have personally reviewed following labs and imaging studies  CBC: Recent Labs  Lab 02/04/21 0832 02/04/21 0903  WBC  --  6.2  NEUTROABS  --  5.5  HGB  18.4* 14.9  HCT 54.0* 50.9  MCV  --  74.2*  PLT  --  650   Basic Metabolic Panel: Recent Labs  Lab 02/04/21 0832 02/04/21 0903 02/04/21 1233  NA 126* 126* 139  K 6.3* 6.8* 4.4  CL  --  87* 106  CO2  --  16* 16*  GLUCOSE  --  1,379* 714*  BUN  --  47* 40*  CREATININE  --  2.45* 1.82*  CALCIUM  --  10.3 9.4   GFR:  Estimated Creatinine Clearance: 57.1 mL/min (A) (by C-G formula based on SCr of 1.82 mg/dL (H)). Liver Function Tests: Recent Labs  Lab 02/04/21 0903  AST 21  ALT 30  ALKPHOS 100  BILITOT 1.6*  PROT 10.0*  ALBUMIN 4.9   No results for input(s): LIPASE, AMYLASE in the last 168 hours. No results for input(s): AMMONIA in the last 168 hours. Coagulation Profile: No results for input(s): INR, PROTIME in the last 168 hours. Cardiac Enzymes: No results for input(s): CKTOTAL, CKMB, CKMBINDEX, TROPONINI in the last 168 hours. BNP (last 3 results) No results for input(s): PROBNP in the last 8760 hours. HbA1C: No results for input(s): HGBA1C in the last 72 hours. CBG: Recent Labs  Lab 02/04/21 1433 02/04/21 1528 02/04/21 1645 02/04/21 1815 02/04/21 1936  GLUCAP 450* 371* 297* 215* 227*   Lipid Profile: No results for input(s): CHOL, HDL, LDLCALC, TRIG, CHOLHDL, LDLDIRECT in the last 72 hours. Thyroid Function Tests: No results for input(s): TSH, T4TOTAL, FREET4, T3FREE, THYROIDAB in the last 72 hours. Anemia Panel: No results for input(s): VITAMINB12, FOLATE, FERRITIN, TIBC, IRON, RETICCTPCT in the last 72 hours. Urine analysis:    Component Value Date/Time   COLORURINE STRAW (A) 02/04/2021 0819   APPEARANCEUR CLEAR 02/04/2021 0819   LABSPEC 1.010 02/04/2021 0819   PHURINE 5.0 02/04/2021 0819   GLUCOSEU >=500 (A) 02/04/2021 0819   GLUCOSEU 500 (A) 05/03/2020 1644   HGBUR TRACE (A) 02/04/2021 0819   BILIRUBINUR NEGATIVE 02/04/2021 0819   BILIRUBINUR negative 05/02/2020 1442   BILIRUBINUR neg. 05/31/2012 1053   KETONESUR 40 (A) 02/04/2021 0819    PROTEINUR NEGATIVE 02/04/2021 0819   UROBILINOGEN 0.2 05/03/2020 1644   NITRITE NEGATIVE 02/04/2021 0819   LEUKOCYTESUR NEGATIVE 02/04/2021 0819    Radiological Exams on Admission: I have personally reviewed images DG Chest Portable 1 View  Result Date: 02/04/2021 CLINICAL DATA:  Cough EXAM: PORTABLE CHEST 1 VIEW COMPARISON:  Previous studies including the examination of 04/28/2004 FINDINGS: The heart size and mediastinal contours are within normal limits. Both lungs are clear. The visualized skeletal structures are unremarkable. IMPRESSION: No active disease is seen in the chest. Electronically Signed   By: Elmer Picker M.D.   On: 02/04/2021 09:17    EKG: I have personally reviewed EKG: sinus tachycardia    Assessment/Plan Principal Problem:   DKA (diabetic ketoacidosis) ([place]) Active Problems:   Diabetes mellitus type II, non insulin dependent ([place])   AKI (acute kidney injury) ([place])    DKA (diabetic ketoacidosis) ([place]) Admit to SDU. DKA protocol. Pt is in-between health insurance due to starting a new job. Will need vial insulin and syringes. Recommended Relion brand from [place]. He will need to followup with PCP for insulin titration and getting back onto oral meds. Discussed extensively with pt and his wife. NPO except ice chips, water. Estimate he is still several liters of free water deficient.  Diabetes mellitus type II, non insulin dependent ([place]) Will need to stop oral DM meds at discharge and switch to basal/bolus insulin regimen until he sees his PCP.  AKI (acute kidney injury) ([place]) AKI due to severe dehydration from his DKA.  Continue IV fluid resuscitation.  Hold any nephrotoxic drugs.  DVT prophylaxis: Lovenox Code Status: Full Code Family Communication: discussed with pt and wife jacqueline at bedside  Disposition Plan: return home  Consults called: none  Admission status: Observation, Step Down Unit   Kristopher Oppenheim, DO [facility] 02/04/2021, 7:49  PM

## 2021-02-04 NOTE — ED Notes (Signed)
Endotool recommended continuing insulin drip at 11 units/hr

## 2021-02-04 NOTE — Subjective & Objective (Signed)
CC: fatigue, polyuria HPI: 50 year old African-American male with a history of type 2 diabetes non-insulin-dependent, prior history of lymphoma who presents to the ER with a 2-week history of dysgeusia.  Patient states that everything he eats or drinks taste like cardboard.  His wife is noticed that he has lost tremendous amount of weight in the last few days.  Patient normally drinks about a gallon of water a day but has been having polydipsia and polyuria for about 2 weeks.  Patient does not check his sugars at all.  He knows when he is feeling hypoglycemic but has never noted hyperglycemia.  No fever no chills.  No nausea vomiting or diarrhea.  On arrival to the ER, patient tachycardic with a heart rate 124 blood pressure 139/91 satting 92% on room air.  Initial lab work VBG pH 7.19 PCO2 of 49.  Serum glucose of 1379, potassium 6.8, BUN of 47 creatinine 2.45.  Bicarbonate of 16.  Beta hydroxybutyrate was 1.49.  Patient diagnosed with DKA started on insulin drip.  Patient transferred to [facility] for further care.

## 2021-02-04 NOTE — ED Notes (Signed)
Endotool recommends continuing insulin drip at 8.5 units/hr.

## 2021-02-04 NOTE — ED Triage Notes (Signed)
Pt reports everything tastes like cardboard and has not been eating or drinking much for the past 2 weeks. Pt reports he has lost weight. No N/V/D or pain. Says he has been having a little shortness of breath upon asking.

## 2021-02-04 NOTE — Assessment & Plan Note (Signed)
AKI due to severe dehydration from his DKA.  Continue IV fluid resuscitation.  Hold any nephrotoxic drugs.

## 2021-02-04 NOTE — ED Notes (Signed)
[facility] at bedside 

## 2021-02-04 NOTE — ED Notes (Signed)
Endotool recommends continuing insulin drip at 11 units/hr

## 2021-02-04 NOTE — Assessment & Plan Note (Signed)
Will need to stop oral DM meds at discharge and switch to basal/bolus insulin regimen until he sees his PCP.

## 2021-02-04 NOTE — Assessment & Plan Note (Signed)
Admit to SDU. DKA protocol. Pt is in-between health insurance due to starting a new job. Will need vial insulin and syringes. Recommended Relion brand from [place]. He will need to followup with PCP for insulin titration and getting back onto oral meds. Discussed extensively with pt and his wife. NPO except ice chips, water. Estimate he is still several liters of free water deficient.

## 2021-02-04 NOTE — ED Provider Notes (Signed)
[facility] EMERGENCY DEPARTMENT Provider Note   CSN: [id number] Arrival date & time: 02/04/21  0102     History Chief Complaint  Patient presents with   Dehydration    William Sheppard is a 50 y.o. male.  The history is provided by the patient.  Illness Location:  General Severity:  Mild Onset quality:  Gradual Timing:  Constant Progression:  Unchanged Chronicity:  New Context:  Feels dehydrated. Things taste dry, urinating frequently. Relieved by:  Nothing Worsened by:  Nothing Associated symptoms: no abdominal pain, no chest pain, no congestion, no cough, no diarrhea, no ear pain, no fatigue, no fever, no headaches, no loss of consciousness, no nausea, no rash, no shortness of breath, no sore throat and no vomiting       Past Medical History:  Diagnosis Date   Chronic headaches    Diabetes mellitus    Hodgkin's lymphoma ([place])    Hyperlipidemia     Patient Active Problem List   Diagnosis Date Noted   Cold intolerance 08/01/2020   Erectile dysfunction 01/09/2020   Vitamin D deficiency 01/09/2020   Right flank pain 12/08/2018   Acute upper respiratory infection 02/21/2018   Pain in right hand 10/09/2017   Pain of right thumb 09/27/2017   Acute right-sided low back pain without sciatica 12/07/2016   Rapid palpitations 11/08/2016   Neck pain on left side 11/08/2016   Left hip pain 02/28/2016   Atypical chest pain 12/20/2015   Dizziness 07/21/2015   Hodgkin's lymphoma ([place]) 03/11/2015   Diabetes mellitus type II, non insulin dependent ([place]) 03/11/2015   Thalassemia, 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[place]) 03/11/2015   Acute sinus infection 01/19/2015   Male hypogonadism 11/05/2013   Low serum testosterone level 11/04/2013   Numbness 11/03/2012   Inadequate sleep hygiene 10/23/2012   Daytime somnolence 09/18/2012   Mid back pain on left side 05/31/2012   Hyperlipidemia    Preventative health care 05/05/2011    Past Surgical History:  Procedure Laterality Date   PORTACATH  PLACEMENT  1999   WISDOM TOOTH EXTRACTION         Family History  Adopted: Yes    Social History   Tobacco Use   Smoking status: Never   Smokeless tobacco: Never  Vaping Use   Vaping Use: Never used  Substance Use Topics   Alcohol use: Yes    Comment: social   Drug use: No    Home Medications Prior to Admission medications   Medication Sig Start Date End Date Taking? Authorizing Provider  aspirin 81 MG EC tablet Take 1 tablet (81 mg total) by mouth daily. Swallow whole. 05/07/12  Yes Biagio Borg, MD  FARXIGA 10 MG TABS tablet TAKE 1 TABLET DAILY BEFORE BREAKFAST 03/28/20  Yes Renato Shin, MD  lisinopril (ZESTRIL) 5 MG tablet TAKE 1 TABLET DAILY 07/08/20  Yes Biagio Borg, MD  metFORMIN (GLUCOPHAGE-XR) 500 MG 24 hr tablet Take 2 tablets (1,000 mg total) by mouth daily. 11/01/20  Yes Renato Shin, MD  pravastatin (PRAVACHOL) 40 MG tablet TAKE 1 TABLET DAILY 07/08/20  Yes Biagio Borg, MD  ALPRAZolam Duanne Moron) 1 MG tablet Take 1 tablet (1 mg total) by mouth 4 (four) times daily as needed for anxiety. 03/26/18   Biagio Borg, MD  cyclobenzaprine (FLEXERIL) 10 MG tablet Take 1 tablet (10 mg total) by mouth 3 (three) times daily as needed for muscle spasms. 05/02/20   Marrian Salvage, FNP  glucose blood test strip And lancets 1/day 250.00  12/29/15   Biagio Borg, MD  meloxicam (MOBIC) 15 MG tablet Take 1 tablet (15 mg total) by mouth daily. 05/02/20   Marrian Salvage, FNP  Semaglutide (RYBELSUS) 14 MG TABS Take 14 mg by mouth daily. 11/01/20   Renato Shin, MD  tadalafil (CIALIS) 20 MG tablet Take 0.5-1 tablets (10-20 mg total) by mouth every other day as needed for erectile dysfunction. 06/13/20   Biagio Borg, MD    Allergies    Patient has no known allergies.  Review of Systems   Review of Systems  Constitutional:  Negative for chills, fatigue and fever.  HENT:  Negative for congestion, ear pain and sore throat.   Eyes:  Negative for pain and visual disturbance.   Respiratory:  Negative for cough and shortness of breath.   Cardiovascular:  Negative for chest pain and palpitations.  Gastrointestinal:  Negative for abdominal pain, diarrhea, nausea and vomiting.  Endocrine: Positive for polydipsia and polyuria.  Genitourinary:  Negative for dysuria and hematuria.  Musculoskeletal:  Negative for arthralgias and back pain.  Skin:  Negative for color change and rash.  Neurological:  Negative for seizures, loss of consciousness, syncope and headaches.  All other systems reviewed and are negative.  Physical Exam Updated Vital Signs BP (!) 146/103    Pulse (!) 112    Temp 97.7 F (36.5 C) (Oral)    Resp 16    Ht 6\' 2"  (1.88 m)    Wt 86.6 kg    SpO2 93%    BMI 24.52 kg/m   Physical Exam Vitals and nursing note reviewed.  Constitutional:      General: He is not in acute distress.    Appearance: He is well-developed. He is not ill-appearing.  HENT:     Head: Normocephalic and atraumatic.     Mouth/Throat:     Mouth: Mucous membranes are dry.  Eyes:     Extraocular Movements: Extraocular movements intact.     Conjunctiva/sclera: Conjunctivae normal.     Pupils: Pupils are equal, round, and reactive to light.  Cardiovascular:     Rate and Rhythm: Regular rhythm. Tachycardia present.     Heart sounds: Normal heart sounds. No murmur heard. Pulmonary:     Effort: Pulmonary effort is normal. No respiratory distress.     Breath sounds: Normal breath sounds.  Abdominal:     Palpations: Abdomen is soft.     Tenderness: There is no abdominal tenderness.  Musculoskeletal:        General: No swelling.     Cervical back: Normal range of motion and neck supple.  Skin:    General: Skin is warm and dry.     Capillary Refill: Capillary refill takes less than 2 seconds.  Neurological:     General: No focal deficit present.     Mental Status: He is alert and oriented to person, place, and time.     Cranial Nerves: No cranial nerve deficit.     Sensory: No  sensory deficit.     Motor: No weakness.     Coordination: Coordination normal.     Gait: Gait normal.  Psychiatric:        Mood and Affect: Mood normal.    ED Results / Procedures / Treatments   Labs (all labs ordered are listed, but only abnormal results are displayed) Labs Reviewed  URINALYSIS, ROUTINE W REFLEX MICROSCOPIC - Abnormal; Notable for the following components:      Result Value   Color,  Urine STRAW (*)    Glucose, UA >=500 (*)    Hgb urine dipstick TRACE (*)    Ketones, ur 40 (*)    All other components within normal limits  URINALYSIS, MICROSCOPIC (REFLEX) - Abnormal; Notable for the following components:   Bacteria, UA MANY (*)    All other components within normal limits  CBC - Abnormal; Notable for the following components:   RBC 6.86 (*)    MCV 74.2 (*)    MCH 21.7 (*)    MCHC 29.3 (*)    RDW 18.0 (*)    All other components within normal limits  COMPREHENSIVE METABOLIC PANEL - Abnormal; Notable for the following components:   Sodium 126 (*)    Potassium 6.8 (*)    Chloride 87 (*)    CO2 16 (*)    Glucose, Bld 1,379 (*)    BUN 47 (*)    Creatinine, Ser 2.45 (*)    Total Protein 10.0 (*)    Total Bilirubin 1.6 (*)    GFR, Estimated 31 (*)    Anion gap 23 (*)    All other components within normal limits  DIFFERENTIAL - Abnormal; Notable for the following components:   Lymphs Abs 0.4 (*)    All other components within normal limits  CBG MONITORING, ED - Abnormal; Notable for the following components:   Glucose-Capillary >600 (*)    All other components within normal limits  I-STAT VENOUS BLOOD GAS, ED - Abnormal; Notable for the following components:   pH, Ven 7.195 (*)    Bicarbonate 19.2 (*)    TCO2 21 (*)    Acid-base deficit 9.0 (*)    Sodium 126 (*)    Potassium 6.3 (*)    HCT 54.0 (*)    Hemoglobin 18.4 (*)    All other components within normal limits  RESP PANEL BY RT-PCR (FLU A&B, COVID) ARPGX2  CBC WITH DIFFERENTIAL/PLATELET   BETA-HYDROXYBUTYRIC ACID  BETA-HYDROXYBUTYRIC ACID  CBG MONITORING, ED    EKG EKG Interpretation  Date/Time:  Saturday February 04 2021 08:29:10 EST Ventricular Rate:  120 PR Interval:  142 QRS Duration: 97 QT Interval:  323 QTC Calculation: 457 R Axis:   -68 Text Interpretation: Sinus tachycardia Consider right atrial enlargement Left anterior fascicular block Confirmed by Ronnald Nian, Zyiah Withington ([id number]) on 02/04/2021 8:34:04 AM  Radiology DG Chest Portable 1 View  Result Date: 02/04/2021 CLINICAL DATA:  Cough EXAM: PORTABLE CHEST 1 VIEW COMPARISON:  Previous studies including the examination of 04/28/2004 FINDINGS: The heart size and mediastinal contours are within normal limits. Both lungs are clear. The visualized skeletal structures are unremarkable. IMPRESSION: No active disease is seen in the chest. Electronically Signed   By: Elmer Picker M.D.   On: 02/04/2021 09:17    Procedures .Critical Care Performed by: Lennice Sites, DO Authorized by: Lennice Sites, DO   Critical care provider statement:    Critical care time (minutes):  35   Critical care was necessary to treat or prevent imminent or life-threatening deterioration of the following conditions:  Dehydration and metabolic crisis   Critical care was time spent personally by me on the following activities:  Blood draw for specimens, development of treatment plan with patient or surrogate, discussions with primary provider, evaluation of patient's response to treatment, examination of patient, obtaining history from patient or surrogate, ordering and performing treatments and interventions, ordering and review of radiographic studies, ordering and review of laboratory studies, re-evaluation of patient's condition, pulse oximetry and review  of old charts   I assumed direction of critical care for this patient from another provider in my specialty: no     Care discussed with: admitting provider     Medications Ordered in  ED Medications  sodium zirconium cyclosilicate (LOKELMA) packet 10 g (has no administration in time range)  insulin regular, human (MYXREDLIN) 100 units/ 100 mL infusion (has no administration in time range)  dextrose 5 % in lactated ringers infusion (has no administration in time range)  dextrose 50 % solution 0-50 mL (has no administration in time range)  sodium chloride 0.9 % bolus 1,000 mL (has no administration in time range)  0.9 %  sodium chloride infusion (has no administration in time range)  calcium gluconate 1 g/ 50 mL sodium chloride IVPB (has no administration in time range)  sodium chloride 0.9 % bolus 1,000 mL (0 mLs Intravenous Stopped 02/04/21 0946)  sodium chloride 0.9 % bolus 1,000 mL (1,000 mLs Intravenous New Bag/Given 02/04/21 0914)    ED Course  I have reviewed the triage vital signs and the nursing notes.  Pertinent labs & imaging results that were available during my care of the patient were reviewed by me and considered in my medical decision making (see chart for details).    MDM Rules/Calculators/A&P                          William Sheppard is here with concern for dehydration.  Patient with history of diabetes.  Unremarkable vitals except for tachycardia.  He looks clinically dehydrated.  Having more frequent urination.  Overall suspect diabetes related process.  It is severe hyperglycemia or DKA.  Denies any infectious symptoms but does state he has decreased taste.  Will evaluate for COVID.  Denies any infectious symptoms.  Denies any cough, chest pain, shortness of breath.  No abdominal pain, nausea, vomiting, diarrhea.  Blood sugar greater than 600.  pH is 7.1, bicarb 19, urinalysis positive for ketones.  Overall patient with mild DKA. Tachycardia is improving with IV fluids.  Blood sugar is 1379.  Bicarb is 16.  Creatinine elevated to 2.45.  Potassium is 6.8.  Anion gap is 23.  Patient given 3 L of normal saline.  Started on maintenance fluids.  Started on IV  insulin per DKA protocol.  Has been given Lokelma, calcium for hyperkalemia.  Will admit to medicine for further care.  This chart was dictated using voice recognition software.  Despite best efforts to proofread,  errors can occur which can change the documentation meaning.      Final Clinical Impression(s) / ED Diagnoses Final diagnoses:  Diabetic ketoacidosis without coma associated with type 2 diabetes mellitus ([place])  Hyperkalemia    Rx / DC Orders ED Discharge Orders     None        Lennice Sites, DO 02/04/21 1010

## 2021-02-04 NOTE — ED Notes (Signed)
[place] bloodwork

## 2021-02-05 LAB — BASIC METABOLIC PANEL
Anion gap: 9 (ref 5–15)
Anion gap: 8 (ref 5–15)
BUN: 29 mg/dL — ABNORMAL HIGH (ref 6–20)
BUN: 28 mg/dL — ABNORMAL HIGH (ref 6–20)
CO2: 23 mmol/L (ref 22–32)
CO2: 24 mmol/L (ref 22–32)
Calcium: 8.9 mg/dL (ref 8.9–10.3)
Calcium: 8.7 mg/dL — ABNORMAL LOW (ref 8.9–10.3)
Chloride: 110 mmol/L (ref 98–111)
Chloride: 103 mmol/L (ref 98–111)
Creatinine, Ser: 1.11 mg/dL (ref 0.61–1.24)
Creatinine, Ser: 1.29 mg/dL — ABNORMAL HIGH (ref 0.61–1.24)
GFR, Estimated: >60 mL/min (ref >60)
GFR, Estimated: >60 mL/min (ref >60)
Glucose, Bld: 189 mg/dL — ABNORMAL HIGH (ref 70–99)
Glucose, Bld: 500 mg/dL — ABNORMAL HIGH (ref 70–99)
Potassium: 3.6 mmol/L (ref 3.5–5.1)
Potassium: 3.6 mmol/L (ref 3.5–5.1)
Sodium: 142 mmol/L (ref 135–145)
Sodium: 135 mmol/L (ref 135–145)

## 2021-02-05 LAB — CBC WITH DIFFERENTIAL/PLATELET
Abs Immature Granulocytes: 0.03 10*3/uL (ref 0.00–0.07)
Basophils Absolute: 0 10*3/uL (ref 0.0–0.1)
Basophils Relative: 0 %
Eosinophils Absolute: 0 10*3/uL (ref 0.0–0.5)
Eosinophils Relative: 0 %
HCT: 40.8 % (ref 39.0–52.0)
Hemoglobin: 12.4 g/dL — ABNORMAL LOW (ref 13.0–17.0)
Immature Granulocytes: 0 %
Lymphocytes Relative: 26 %
Lymphs Abs: 2 10*3/uL (ref 0.7–4.0)
MCH: 22.1 pg — ABNORMAL LOW (ref 26.0–34.0)
MCHC: 30.4 g/dL (ref 30.0–36.0)
MCV: 72.6 fL — ABNORMAL LOW (ref 80.0–100.0)
Monocytes Absolute: 0.5 10*3/uL (ref 0.1–1.0)
Monocytes Relative: 6 %
Neutro Abs: 5.2 10*3/uL (ref 1.7–7.7)
Neutrophils Relative %: 68 %
Platelets: 166 10*3/uL (ref 150–400)
RBC: 5.62 MIL/uL (ref 4.22–5.81)
RDW: 16 % — ABNORMAL HIGH (ref 11.5–15.5)
WBC: 7.7 10*3/uL (ref 4.0–10.5)
nRBC: 0 % (ref 0.0–0.2)

## 2021-02-05 LAB — HIV ANTIBODY (ROUTINE TESTING W REFLEX): HIV Screen 4th Generation wRfx: NONREACTIVE

## 2021-02-05 LAB — GLUCOSE, CAPILLARY
Glucose-Capillary: 164 mg/dL — ABNORMAL HIGH (ref 70–99)
Glucose-Capillary: 172 mg/dL — ABNORMAL HIGH (ref 70–99)
Glucose-Capillary: 188 mg/dL — ABNORMAL HIGH (ref 70–99)
Glucose-Capillary: 295 mg/dL — ABNORMAL HIGH (ref 70–99)
Glucose-Capillary: 300 mg/dL — ABNORMAL HIGH (ref 70–99)
Glucose-Capillary: 304 mg/dL — ABNORMAL HIGH (ref 70–99)
Glucose-Capillary: 336 mg/dL — ABNORMAL HIGH (ref 70–99)

## 2021-02-05 LAB — TROPONIN I (HIGH SENSITIVITY)
Troponin I (High Sensitivity): 34 ng/L — ABNORMAL HIGH (ref <18)
Troponin I (High Sensitivity): 29 ng/L — ABNORMAL HIGH (ref <18)

## 2021-02-05 LAB — BETA-HYDROXYBUTYRIC ACID: Beta-Hydroxybutyric Acid: 1.01 mmol/L — ABNORMAL HIGH (ref 0.05–0.27)

## 2021-02-05 LAB — COMPREHENSIVE METABOLIC PANEL
ALT: 27 U/L (ref 0–44)
AST: 22 U/L (ref 15–41)
Albumin: 3.6 g/dL (ref 3.5–5.0)
Alkaline Phosphatase: 50 U/L (ref 38–126)
Anion gap: 12 (ref 5–15)
BUN: 28 mg/dL — ABNORMAL HIGH (ref 6–20)
CO2: 20 mmol/L — ABNORMAL LOW (ref 22–32)
Calcium: 8.6 mg/dL — ABNORMAL LOW (ref 8.9–10.3)
Chloride: 106 mmol/L (ref 98–111)
Creatinine, Ser: 1.2 mg/dL (ref 0.61–1.24)
GFR, Estimated: >60 mL/min (ref >60)
Glucose, Bld: 410 mg/dL — ABNORMAL HIGH (ref 70–99)
Potassium: 3.7 mmol/L (ref 3.5–5.1)
Sodium: 138 mmol/L (ref 135–145)
Total Bilirubin: 1.2 mg/dL (ref 0.3–1.2)
Total Protein: 6.5 g/dL (ref 6.5–8.1)

## 2021-02-05 LAB — MAGNESIUM: Magnesium: 2.2 mg/dL (ref 1.7–2.4)

## 2021-02-05 MED ORDER — INSULIN ASPART 100 UNIT/ML IJ SOLN
3.0000 [IU] | Freq: Three times a day (TID) | INTRAMUSCULAR | Status: DC
  Administered 2021-02-05 – 2021-02-06 (×3): 3 [IU] via SUBCUTANEOUS

## 2021-02-05 MED ORDER — INSULIN ASPART 100 UNIT/ML IJ SOLN
0.0000 [IU] | INTRAMUSCULAR | Status: DC
  Administered 2021-02-05: 23:00:00 2 [IU] via SUBCUTANEOUS
  Administered 2021-02-05: 08:00:00 8 [IU] via SUBCUTANEOUS
  Administered 2021-02-05 (×2): 11 [IU] via SUBCUTANEOUS
  Administered 2021-02-05 – 2021-02-06 (×2): 8 [IU] via SUBCUTANEOUS
  Administered 2021-02-06 (×2): 3 [IU] via SUBCUTANEOUS

## 2021-02-05 MED ORDER — ACETAMINOPHEN 325 MG PO TABS
650.0000 mg | ORAL_TABLET | Freq: Four times a day (QID) | ORAL | Status: DC | PRN
  Administered 2021-02-06: 650 mg via ORAL
  Filled 2021-02-05: qty 2

## 2021-02-05 MED ORDER — ASPIRIN EC 81 MG PO TBEC
81.0000 mg | DELAYED_RELEASE_TABLET | Freq: Every day | ORAL | Status: DC
  Administered 2021-02-05 – 2021-02-06 (×2): 81 mg via ORAL
  Filled 2021-02-05 (×2): qty 1

## 2021-02-05 MED ORDER — INSULIN GLARGINE-YFGN 100 UNIT/ML ~~LOC~~ SOLN: 12.0000 [IU] | Freq: Every day | SUBCUTANEOUS | Status: DC

## 2021-02-05 MED ORDER — METOPROLOL TARTRATE 5 MG/5ML IV SOLN: 5.0000 mg | INTRAVENOUS | Status: DC | PRN

## 2021-02-05 MED ORDER — INSULIN GLARGINE-YFGN 100 UNIT/ML ~~LOC~~ SOLN: 10.0000 [IU] | Freq: Two times a day (BID) | SUBCUTANEOUS | Status: DC

## 2021-02-05 MED ORDER — LIDOCAINE VISCOUS HCL 2 % MT SOLN
15.0000 mL | Freq: Four times a day (QID) | OROMUCOSAL | Status: DC | PRN
  Administered 2021-02-06: 15 mL via OROMUCOSAL
  Filled 2021-02-05: qty 15

## 2021-02-05 MED ORDER — INSULIN GLARGINE-YFGN 100 UNIT/ML ~~LOC~~ SOLN
15.0000 [IU] | Freq: Two times a day (BID) | SUBCUTANEOUS | Status: DC
  Administered 2021-02-05 – 2021-02-06 (×2): 15 [IU] via SUBCUTANEOUS
  Filled 2021-02-05 (×3): qty 0.15

## 2021-02-05 MED ORDER — SODIUM CHLORIDE 0.9 % IV SOLN: INTRAVENOUS | Status: DC

## 2021-02-05 MED ORDER — PRAVASTATIN SODIUM 20 MG PO TABS
40.0000 mg | ORAL_TABLET | Freq: Every day | ORAL | Status: DC
  Administered 2021-02-05: 17:00:00 40 mg via ORAL
  Filled 2021-02-05: qty 2

## 2021-02-05 MED ORDER — INSULIN ASPART 100 UNIT/ML IJ SOLN
18.0000 [IU] | Freq: Once | INTRAMUSCULAR | Status: AC
  Administered 2021-02-05: 04:00:00 18 [IU] via SUBCUTANEOUS

## 2021-02-05 MED ORDER — INSULIN GLARGINE-YFGN 100 UNIT/ML ~~LOC~~ SOLN
10.0000 [IU] | Freq: Two times a day (BID) | SUBCUTANEOUS | Status: DC
  Administered 2021-02-05: 08:00:00 10 [IU] via SUBCUTANEOUS
  Filled 2021-02-05 (×2): qty 0.1

## 2021-02-05 NOTE — Progress Notes (Signed)
Inpatient Diabetes Program Recommendations  AACE/ADA: New Consensus Statement on Inpatient Glycemic Control (2015)  Target Ranges:  Prepandial:   less than 140 mg/dL      Peak postprandial:   less than 180 mg/dL (1-2 hours)      Critically ill patients:  140 - 180 mg/dL    Latest Reference Range & Units 02/04/21 09:03  Glucose 70 - 99 mg/dL 1,379 [facility])    Latest Reference Range & Units 02/04/21 20:35 02/04/21 21:38 02/04/21 22:37 02/04/21 23:48 02/05/21 00:47 02/05/21 01:51 02/05/21 07:55  Glucose-Capillary 70 - 99 mg/dL 231 (H) 203 (H) 196 (H) 186 (H) 188 (H) 172 (H)  IV Insulin Stopped at 0205 am  18 units Novolog @0409  300 (H)  8 units Novolog  10 units Semglee    Latest Reference Range & Units 02/04/21 08:19 02/04/21 18:21 02/05/21 00:34  Beta-Hydroxybutyric Acid 0.05 - 0.27 mmol/L 6.09 (H) 1.49 (H) 1.01 (H)    Admit with: DKA  History: DM2  Home DM Meds: Farxiga 10 mg daily       Metformin 500 mg daily       Rybelsus 14 mg daily  Current Orders: Semglee 10 units BID      Novolog Moderate Correction Scale/ SSI (0-15 units) Q4 hours    Endocrinologist: Dr. Sarita Bottom seen 11/01/2020--Rybelsus oral med dose was increased at that visit  According to MD notes: Pt is in-between health insurance due to starting a new job. Will need vial insulin and syringes. Recommended Relion brand from [place]. He will need to followup with PCP for insulin titration and getting back onto oral meds.  Will need to stop oral DM meds at discharge and switch to basal/bolus insulin regimen until he sees his PCP  Unfortunately IV Insulin Drip was stopped at 2am and Basal insulin was not started until 8am--CBG 300 this AM as a result  MD- Note conversation with pt about starting basal/bolus Insulin regimen for home until pt can get back to see his Endocrinologist when his insurance starts at his new job.  Note that Reli-On insulin from [employer] recommended by MD.  Please consider NPH  Insulin BID (Breakfast and Bedtime) + Regular TID with meals    --Will follow patient during hospitalization--  Wyn Quaker RN, MSN, CDE Diabetes Coordinator Inpatient Glycemic Control Team Team Pager: [phone] (8a-5p)

## 2021-02-05 NOTE — Progress Notes (Addendum)
Pt developed ectopy (multifocal PVCs) followed by a sustained HR 160s. Pt complaint of feeling like his heart was racing and some dizziness off and on. EKG obtained. [place], NP made aware and orders received for labs.

## 2021-02-05 NOTE — Progress Notes (Signed)
PROGRESS NOTE  William Sheppard JAS:[id number] DOB: 03-15-1970 DOA: 02/04/2021 PCP: Biagio Borg, MD  HPI/Recap of past 55 hours: 50 year old male with a history of type 2 diabetes non-insulin-dependent, prior history of lymphoma who presents to the ER with a 2-week history of dysgeusia.  Patient states that everything he eats or drinks tasted like cardboard. His wife noticed that he has lost tremendous amount of weight in the last few days. Patient normally drinks about a gallon of water a day but has been having polydipsia and polyuria for about 2 weeks. Patient does not check his sugars at all.  He knows when he is feeling hypoglycemic but has never noted hyperglycemia. On arrival to the ER, patient tachycardic with a heart rate 124 blood pressure 139/91 satting 92% on room air. Initial lab work VBG pH 7.19 PCO2 of 49. Serum glucose of 1379, potassium 6.8, BUN of 47 creatinine 2.45.  Bicarbonate of 16, anion gap 23, Beta hydroxybutyrate was 6.09. Patient found to be in DKA and started on insulin drip.    Today, patient reports occasional palpitations, noted to have multiple PVCs on telemetry, also reports some dizziness.  Patient denies any chest pain, shortness of breath, abdominal pain, nausea/vomiting, fever/chills.    Assessment/Plan: Principal Problem:   DKA (diabetic ketoacidosis) ([place]) Active Problems:   Diabetes mellitus type II, non insulin dependent ([place])   AKI (acute kidney injury) ([employer])   DKA Diabetes mellitus type 2, uncontrolled CBGs remains uncontrolled Last A1c 8.6 on 10/2020, repeat pending On presentation, serum glucose of 1379, potassium 6.8, BUN of 47 creatinine 2.45. Bicarbonate of 16, anion gap 23, Beta hydroxybutyrate was 6.09 Patient was started on insulin drip, and was stopped without any long acting insulin prior to stopping the drip Resumed diet Continue SSI, NovoLog 3 times daily, glargine, Accu-Cheks, hypoglycemic protocol Plan to DC patient on insulin  regimen Monitor closely  AKI Likely 2/2 above Baseline creatinine normal On admission Cr 2.45 Continue IV fluids Daily BMP  Hyperlipidemia Continue statins  Multiple PVCs Reported intermittently palpitations Potassium and magnesium WNL EKG with PACs Echocardiogram pending Telemetry     Estimated body mass index is 24 kg/m as calculated from the following:   Height as of this encounter: 6\' 2"  (1.88 m).   Weight as of this encounter: 84.8 kg.     Code Status: Full  Family Communication: Discussed with wife over the phone  Disposition Plan: Status is: Observation  The patient will require care spanning > 2 midnights and should be moved to inpatient because: Level of care     Consultants: None  Procedures: None  Antimicrobials: None  DVT prophylaxis: Lovenox   Objective: Vitals:   02/05/21 0807 02/05/21 0900 02/05/21 1000 02/05/21 1100  BP:  127/66 112/87 104/80  Pulse:  (!) 59 (!) 107 100  Resp:  19 18 18   Temp: 97.9 F (36.6 C)     TempSrc: Oral     SpO2:  97% 96% 96%  Weight:      Height:        Intake/Output Summary (Last 24 hours) at 02/05/2021 1116 Last data filed at 02/05/2021 0418 Gross per 24 hour  Intake 3526.3 ml  Output 3600 ml  Net -73.7 ml   Filed Weights   02/04/21 0754 02/04/21 1807  Weight: 86.6 kg 84.8 kg    Exam: General: NAD  Cardiovascular: S1, S2 present Respiratory: CTAB Abdomen: Soft, nontender, nondistended, bowel sounds present Musculoskeletal: No bilateral pedal edema noted Skin: Normal  Psychiatry: Normal mood     Data Reviewed: CBC: Recent Labs  Lab 02/04/21 0832 02/04/21 0903 02/05/21 0511  WBC  --  6.2 7.7  NEUTROABS  --  5.5 5.2  HGB 18.4* 14.9 12.4*  HCT 54.0* 50.9 40.8  MCV  --  74.2* 72.6*  PLT  --  229 093   Basic Metabolic Panel: Recent Labs  Lab 02/04/21 1233 02/04/21 1821 02/05/21 0034 02/05/21 0511 02/05/21 0928  NA 139 148* 142 138 135  K 4.4 3.9 3.6 3.7 3.6  CL 106  115* 110 106 103  CO2 16* 24 23 20* 24  GLUCOSE 714* 236* 189* 410* 500*  BUN 40* 34* 29* 28* 28*  CREATININE 1.82* 1.45* 1.11 1.20 1.29*  CALCIUM 9.4 9.8 8.9 8.6* 8.7*  MG  --   --   --  2.2  --    GFR: Estimated Creatinine Clearance: 80.5 mL/min (A) (by C-G formula based on SCr of 1.29 mg/dL (H)). Liver Function Tests: Recent Labs  Lab 02/04/21 0903 02/05/21 0511  AST 21 22  ALT 30 27  ALKPHOS 100 50  BILITOT 1.6* 1.2  PROT 10.0* 6.5  ALBUMIN 4.9 3.6   No results for input(s): LIPASE, AMYLASE in the last 168 hours. No results for input(s): AMMONIA in the last 168 hours. Coagulation Profile: No results for input(s): INR, PROTIME in the last 168 hours. Cardiac Enzymes: No results for input(s): CKTOTAL, CKMB, CKMBINDEX, TROPONINI in the last 168 hours. BNP (last 3 results) No results for input(s): PROBNP in the last 8760 hours. HbA1C: No results for input(s): HGBA1C in the last 72 hours. CBG: Recent Labs  Lab 02/04/21 2237 02/04/21 2348 02/05/21 0047 02/05/21 0151 02/05/21 0755  GLUCAP 196* 186* 188* 172* 300*   Lipid Profile: No results for input(s): CHOL, HDL, LDLCALC, TRIG, CHOLHDL, LDLDIRECT in the last 72 hours. Thyroid Function Tests: No results for input(s): TSH, T4TOTAL, FREET4, T3FREE, THYROIDAB in the last 72 hours. Anemia Panel: No results for input(s): VITAMINB12, FOLATE, FERRITIN, TIBC, IRON, RETICCTPCT in the last 72 hours. Urine analysis:    Component Value Date/Time   COLORURINE STRAW (A) 02/04/2021 0819   APPEARANCEUR CLEAR 02/04/2021 0819   LABSPEC 1.010 02/04/2021 0819   PHURINE 5.0 02/04/2021 0819   GLUCOSEU >=500 (A) 02/04/2021 0819   GLUCOSEU 500 (A) 05/03/2020 1644   HGBUR TRACE (A) 02/04/2021 0819   BILIRUBINUR NEGATIVE 02/04/2021 0819   BILIRUBINUR negative 05/02/2020 1442   BILIRUBINUR neg. 05/31/2012 1053   KETONESUR 40 (A) 02/04/2021 0819   PROTEINUR NEGATIVE 02/04/2021 0819   UROBILINOGEN 0.2 05/03/2020 1644   NITRITE  NEGATIVE 02/04/2021 0819   LEUKOCYTESUR NEGATIVE 02/04/2021 0819   Sepsis Labs: @LABRCNTIP (procalcitonin:4,lacticidven:4)  ) Recent Results (from the past 240 hour(s))  Resp Panel by RT-PCR (Flu A&B, Covid) Urine, Clean Catch     Status: None   Collection Time: 02/04/21  8:35 AM   Specimen: Urine, Clean Catch; Nasopharyngeal(NP) swabs in vial transport medium  Result Value Ref Range Status   SARS Coronavirus 2 by RT PCR NEGATIVE NEGATIVE Final    Comment: (NOTE) SARS-CoV-2 target nucleic acids are NOT DETECTED.  The SARS-CoV-2 RNA is generally detectable in upper respiratory specimens during the acute phase of infection. The lowest concentration of SARS-CoV-2 viral copies this assay can detect is 138 copies/mL. A negative result does not preclude SARS-Cov-2 infection and should not be used as the sole basis for treatment or other patient management decisions. A negative result may occur with  improper specimen collection/handling, submission of specimen other than nasopharyngeal swab, presence of viral mutation(s) within the areas targeted by this assay, and inadequate number of viral copies(<138 copies/mL). A negative result must be combined with clinical observations, patient history, and epidemiological information. The expected result is Negative.  Fact Sheet for Patients:  EntrepreneurPulse.com.au  Fact Sheet for Healthcare Providers:  IncredibleEmployment.be  This test is no t yet approved or cleared by the [place] FDA and  has been authorized for detection and/or diagnosis of SARS-CoV-2 by FDA under an Emergency Use Authorization (EUA). This EUA will remain  in effect (meaning this test can be used) for the duration of the COVID-19 declaration under Section 564(b)(1) of the Act, 21 U.S.C.section 360bbb-3(b)(1), unless the authorization is terminated  or revoked sooner.       Influenza A by PCR NEGATIVE NEGATIVE Final    Influenza B by PCR NEGATIVE NEGATIVE Final    Comment: (NOTE) The Xpert Xpress SARS-CoV-2/FLU/RSV plus assay is intended as an aid in the diagnosis of influenza from Nasopharyngeal swab specimens and should not be used as a sole basis for treatment. Nasal washings and aspirates are unacceptable for Xpert Xpress SARS-CoV-2/FLU/RSV testing.  Fact Sheet for Patients: EntrepreneurPulse.com.au  Fact Sheet for Healthcare Providers: IncredibleEmployment.be  This test is not yet approved or cleared by the [place] FDA and has been authorized for detection and/or diagnosis of SARS-CoV-2 by FDA under an Emergency Use Authorization (EUA). This EUA will remain in effect (meaning this test can be used) for the duration of the COVID-19 declaration under Section 564(b)(1) of the Act, 21 U.S.C. section 360bbb-3(b)(1), unless the authorization is terminated or revoked.  Performed at [facility], [address] [place], [place] [address]   MRSA Next Gen by PCR, Nasal     Status: None   Collection Time: 02/04/21  6:10 PM   Specimen: Nasal Mucosa; Nasal Swab  Result Value Ref Range Status   MRSA by PCR Next Gen NOT DETECTED NOT DETECTED Final    Comment: (NOTE) The GeneXpert MRSA Assay (FDA approved for NASAL specimens only), is one component of a comprehensive MRSA colonization surveillance program. It is not intended to diagnose MRSA infection nor to guide or monitor treatment for MRSA infections. Test performance is not FDA approved in patients less than 48 years old. Performed at [facility], [place] [address] [place], [place] [address]       Studies: No results found.  Scheduled Meds:  Chlorhexidine Gluconate Cloth  6 each Topical Daily   enoxaparin (LOVENOX) injection  40 mg Subcutaneous Q24H   insulin aspart  0-15 Units Subcutaneous Q4H   insulin glargine-yfgn  10 Units Subcutaneous BID    Continuous  Infusions:   LOS: 0 days     Alma Friendly, MD [facility]  If 7PM-7AM, please contact night-coverage [email or web address] 02/05/2021, 11:16 AM

## 2021-02-06 ENCOUNTER — Observation Stay (HOSPITAL_BASED_OUTPATIENT_CLINIC_OR_DEPARTMENT_OTHER): Payer: Self-pay

## 2021-02-06 DIAGNOSIS — R9431 Abnormal electrocardiogram [ECG] [EKG]: Secondary | ICD-10-CM

## 2021-02-06 LAB — BASIC METABOLIC PANEL
Anion gap: 7 (ref 5–15)
BUN: 18 mg/dL (ref 6–20)
CO2: 25 mmol/L (ref 22–32)
Calcium: 8.2 mg/dL — ABNORMAL LOW (ref 8.9–10.3)
Chloride: 107 mmol/L (ref 98–111)
Creatinine, Ser: 1 mg/dL (ref 0.61–1.24)
GFR, Estimated: >60 mL/min (ref >60)
Glucose, Bld: 176 mg/dL — ABNORMAL HIGH (ref 70–99)
Potassium: 3.3 mmol/L — ABNORMAL LOW (ref 3.5–5.1)
Sodium: 139 mmol/L (ref 135–145)

## 2021-02-06 LAB — ECHOCARDIOGRAM COMPLETE
Area-P 1/2: 5.75 cm2
Calc EF: 57.1 %
Height: 74 in
S' Lateral: 3.8 cm
Single Plane A2C EF: 54.3 %
Single Plane A4C EF: 58.6 %
Weight: 2991.2 oz

## 2021-02-06 LAB — CBC WITH DIFFERENTIAL/PLATELET
Abs Immature Granulocytes: 0.01 10*3/uL (ref 0.00–0.07)
Basophils Absolute: 0 10*3/uL (ref 0.0–0.1)
Basophils Relative: 0 %
Eosinophils Absolute: 0 10*3/uL (ref 0.0–0.5)
Eosinophils Relative: 1 %
HCT: 37.8 % — ABNORMAL LOW (ref 39.0–52.0)
Hemoglobin: 11.6 g/dL — ABNORMAL LOW (ref 13.0–17.0)
Immature Granulocytes: 0 %
Lymphocytes Relative: 48 %
Lymphs Abs: 2.8 10*3/uL (ref 0.7–4.0)
MCH: 22.2 pg — ABNORMAL LOW (ref 26.0–34.0)
MCHC: 30.7 g/dL (ref 30.0–36.0)
MCV: 72.4 fL — ABNORMAL LOW (ref 80.0–100.0)
Monocytes Absolute: 0.4 10*3/uL (ref 0.1–1.0)
Monocytes Relative: 7 %
Neutro Abs: 2.5 10*3/uL (ref 1.7–7.7)
Neutrophils Relative %: 44 %
Platelets: 156 10*3/uL (ref 150–400)
RBC: 5.22 MIL/uL (ref 4.22–5.81)
RDW: 15.4 % (ref 11.5–15.5)
WBC: 5.8 10*3/uL (ref 4.0–10.5)
nRBC: 0 % (ref 0.0–0.2)

## 2021-02-06 LAB — GLUCOSE, CAPILLARY
Glucose-Capillary: 411 mg/dL — ABNORMAL HIGH (ref 70–99)
Glucose-Capillary: 173 mg/dL — ABNORMAL HIGH (ref 70–99)
Glucose-Capillary: 173 mg/dL — ABNORMAL HIGH (ref 70–99)
Glucose-Capillary: 271 mg/dL — ABNORMAL HIGH (ref 70–99)
Glucose-Capillary: 270 mg/dL — ABNORMAL HIGH (ref 70–99)

## 2021-02-06 LAB — LIPID PANEL
Cholesterol: 167 mg/dL (ref 0–200)
HDL: 40 mg/dL — ABNORMAL LOW (ref >40)
LDL Cholesterol: 91 mg/dL (ref 0–99)
Total CHOL/HDL Ratio: 4.2 RATIO
Triglycerides: 180 mg/dL — ABNORMAL HIGH (ref <150)
VLDL: 36 mg/dL (ref 0–40)

## 2021-02-06 MED ORDER — POTASSIUM CHLORIDE CRYS ER 20 MEQ PO TBCR
40.0000 meq | EXTENDED_RELEASE_TABLET | Freq: Once | ORAL | Status: AC
  Administered 2021-02-06: 11:00:00 40 meq via ORAL
  Filled 2021-02-06: qty 2

## 2021-02-06 MED ORDER — INSULIN NPH (HUMAN) (ISOPHANE) 100 UNIT/ML ~~LOC~~ SUSP: 15.0000 [IU] | Freq: Two times a day (BID) | SUBCUTANEOUS | 0 refills | Status: DC

## 2021-02-06 MED ORDER — INSULIN SYRINGES (DISPOSABLE) U-100 0.3 ML MISC: 0 refills | Status: DC

## 2021-02-06 MED ORDER — INSULIN REGULAR HUMAN 100 UNIT/ML IJ SOLN: 6.0000 [IU] | Freq: Three times a day (TID) | INTRAMUSCULAR | 0 refills | Status: DC

## 2021-02-06 NOTE — Progress Notes (Signed)
°  Echocardiogram 2D Echocardiogram has been performed.  Bobbye Charleston 02/06/2021, 8:42 AM

## 2021-02-06 NOTE — Discharge Summary (Signed)
Discharge Summary  William Sheppard XNA:[id number] DOB: Dec 28, 1970  PCP: Biagio Borg, MD  Admit date: 02/04/2021 Discharge date: 02/06/2021  Time spent: 40 mins  Recommendations for Outpatient Follow-up:  Call [facility] to establish care with a PCP for now before your insurance is activated in 1 week Follow-up with endocrinologist once insurance starts  Discharge Diagnoses:  Active Hospital Problems   Diagnosis Date Noted   DKA (diabetic ketoacidosis) ([place]) 02/04/2021   AKI (acute kidney injury) ([place]) 02/04/2021   Diabetes mellitus type II, non insulin dependent ([place]) 03/11/2015    Resolved Hospital Problems  No resolved problems to display.    Discharge Condition: Stable  Diet recommendation: Modified carb/heart healthy  Vitals:   02/06/21 1200 02/06/21 1300  BP: (!) 141/89 119/76  Pulse: 98 93  Resp: 13 19  Temp:    SpO2: 100% 97%    History of present illness:  50 year old male with a history of type 2 diabetes non-insulin-dependent, prior history of lymphoma who presents to the ER with a 2-week history of dysgeusia.  Patient states that everything he eats or drinks tasted like cardboard. His wife noticed that he has lost tremendous amount of weight in the last few days. Patient normally drinks about a gallon of water a day but has been having polydipsia and polyuria for about 2 weeks. Patient does not check his sugars at all.  He knows when he is feeling hypoglycemic but has never noted hyperglycemia. On arrival to the ER, patient tachycardic with a heart rate 124 blood pressure 139/91 satting 92% on room air. Initial lab work VBG pH 7.19 PCO2 of 49. Serum glucose of 1379, potassium 6.8, BUN of 47 creatinine 2.45.  Bicarbonate of 16, anion gap 23, Beta hydroxybutyrate was 6.09. Patient found to be in DKA and started on insulin drip.    Today, patient CBGs noted to be much better, denies any chest pain, worsening palpitations, shortness  of breath, abdominal pain, nausea/vomiting, fever/chills.  Patient advised to adhere to lifestyle changes including healthy diet and regular exercise.  Discussed with patient and wife about holding all oral hypoglycemics and switching over to insulin regimen for now until seen by endocrinologist.  Advised to establish care with [facility] to follow-up with PCP before his insurance kicks in January 2023.    Hospital Course:  Principal Problem:   DKA (diabetic ketoacidosis) ([place]) Active Problems:   Diabetes mellitus type II, non insulin dependent ([facility])   AKI (acute kidney injury) ([place])   DKA-resolved Diabetes mellitus type 2, uncontrolled Last A1c 8.6 on 10/2020, repeat pending (PCP to follow-up) On presentation, serum glucose of 1379, potassium 6.8, BUN of 47 creatinine 2.45. Bicarbonate of 16, anion gap 23, Beta hydroxybutyrate was 6.09 Patient was started on insulin drip, and was transitioned to subcu insulin Patient will be discharged on NPH 15 units twice daily, Novolog 6 units 3 times daily Advised to hold off any oral hypoglycemics, Farxiga, Rybelsus, metformin until seen by his endocrinologist once insurance kicks in Advised healthy diet and regular exercise Follow-up with PCP for now   AKI Resolved Likely 2/2 above On admission Cr 2.45 S/P IV fluids Follow-up with PCP  Hypokalemia Replace as needed   Hyperlipidemia Continue statins  Hypertension Continue lisinopril   Multiple PVCs Improved Reported intermittently palpitations Potassium and magnesium WNL EKG with PACs Echocardiogram showed EF of 50 to 55%, no regional wall motion abnormality, left ventricular diastolic parameters were normal Patient  will need outpatient follow-up with PCP and cardiology     Estimated body mass index is 24 kg/m as calculated from the following:   Height as of this encounter: 6\' 2"  (1.88 m).   Weight as of this encounter: 84.8 kg.     Procedures: None  Consultations: Diabetes coordinator  Discharge Exam: BP 119/76    Pulse 93    Temp 98.2 F (36.8 C) (Oral)    Resp 19    Ht 6\' 2"  (1.88 m)    Wt 84.8 kg    SpO2 97%    BMI 24.00 kg/m   General: NAD  Cardiovascular: S1, S2 present Respiratory: CTAB Abdomen: Soft, nontender, nondistended, bowel sounds present Musculoskeletal: No bilateral pedal edema noted Skin: Normal Psychiatry: Normal mood    Discharge Instructions You were cared for by a hospitalist during your hospital stay. If you have any questions about your discharge medications or the care you received while you were in the hospital after you are discharged, you can call the unit and asked to speak with the hospitalist on call if the hospitalist that took care of you is not available. Once you are discharged, your primary care physician will handle any further medical issues. Please note that NO REFILLS for any discharge medications will be authorized once you are discharged, as it is imperative that you return to your primary care physician (or establish a relationship with a primary care physician if you do not have one) for your aftercare needs so that they can reassess your need for medications and monitor your lab values.   Allergies as of 02/06/2021   No Known Allergies      Medication List     STOP taking these medications    ALPRAZolam 1 MG tablet Commonly known as: XANAX   cyclobenzaprine 10 MG tablet Commonly known as: FLEXERIL   Farxiga 10 MG Tabs tablet Generic drug: dapagliflozin propanediol   meloxicam 15 MG tablet Commonly known as: MOBIC   metFORMIN 500 MG 24 hr tablet Commonly known as: GLUCOPHAGE-XR   Rybelsus 14 MG Tabs Generic drug: Semaglutide       TAKE these medications    aspirin 81 MG EC tablet Take 1 tablet (81 mg total) by mouth daily. Swallow whole.   glucose blood test strip And lancets 1/day 250.00   insulin NPH Human 100 UNIT/ML  injection Commonly known as: NovoLIN N ReliOn Inject 0.15 mLs (15 Units total) into the skin 2 (two) times daily at 8 am and 10 pm.   insulin regular 100 units/mL injection Commonly known as: NovoLIN R ReliOn Inject 0.06 mLs (6 Units total) into the skin 3 (three) times daily before meals. Only give half (3U) with meals if eating small meal or hold if unable to eat   Insulin Syringes (Disposable) U-100 0.3 ML Misc Insulin syringes   lisinopril 5 MG tablet Commonly known as: ZESTRIL TAKE 1 TABLET DAILY What changed: additional instructions   pravastatin 40 MG tablet Commonly known as: PRAVACHOL TAKE 1 TABLET DAILY What changed: additional instructions   tadalafil 20 MG tablet Commonly known as: CIALIS Take 0.5-1 tablets (10-20 mg total) by mouth every other day as needed for erectile dysfunction.       No Known Allergies  Follow-up Information     [facility]. Schedule an appointment as soon as possible for a visit in 1 week(s).   Why: Call to make an appointment for follow up with a primary care  doctor for now. Let them know you were just discharged from the hospital and you currently have no insurance Contact information: [address] [place] [address] [phone]                 The results of significant diagnostics from this hospitalization (including imaging, microbiology, ancillary and laboratory) are listed below for reference.    Significant Diagnostic Studies: DG Chest Portable 1 View  Result Date: 02/04/2021 CLINICAL DATA:  Cough EXAM: PORTABLE CHEST 1 VIEW COMPARISON:  Previous studies including the examination of 04/28/2004 FINDINGS: The heart size and mediastinal contours are within normal limits. Both lungs are clear. The visualized skeletal structures are unremarkable. IMPRESSION: No active disease is seen in the chest. Electronically Signed   By: Elmer Picker M.D.   On: 02/04/2021  09:17   ECHOCARDIOGRAM COMPLETE  Result Date: 02/06/2021    ECHOCARDIOGRAM REPORT   Patient Name:   NICHOLA WARREN Rothermel Date of Exam: 02/06/2021 Medical Rec #:  [id number]     Height:       74.0 in Accession #:    [id number]    Weight:       186.9 lb Date of Birth:  1971/01/28    BSA:          2.112 m Patient Age:    50 years      BP:           139/96 mmHg Patient Gender: M             HR:           94 bpm. Exam Location:  Inpatient Procedure: 2D Echo, 3D Echo, Cardiac Doppler and Color Doppler Indications:    R94.31 Abnormal EKG  History:        Patient has no prior history of Echocardiogram examinations.                 Signs/Symptoms:Dizziness/Lightheadedness; Risk Factors:Diabetes                 and Dyslipidemia. Lymphoma.  Sonographer:    Roseanna Rainbow RDCS Referring Phys: [id number] [place]  1. Left ventricular ejection fraction, by estimation, is 50 to 55%. The left ventricle has low normal function. The left ventricle has no regional wall motion abnormalities. Left ventricular diastolic parameters were normal.  2. Right ventricular systolic function is normal. The right ventricular size is normal.  3. The mitral valve is normal in structure. Trivial mitral valve regurgitation. No evidence of mitral stenosis.  4. The aortic valve is tricuspid. Aortic valve regurgitation is not visualized. No aortic stenosis is present.  5. The inferior vena cava is normal in size with greater than 50% respiratory variability, suggesting right atrial pressure of 3 mmHg. Comparison(s): No prior Echocardiogram. FINDINGS  Left Ventricle: Left ventricular ejection fraction, by estimation, is 50 to 55%. The left ventricle has low normal function. The left ventricle has no regional wall motion abnormalities. The left ventricular internal cavity size was normal in size. There is no left ventricular hypertrophy. Left ventricular diastolic parameters were normal. Right Ventricle: The right ventricular size is normal.  Right ventricular systolic function is normal. Left Atrium: Left atrial size was normal in size. Right Atrium: Right atrial size was normal in size. Pericardium: There is no evidence of pericardial effusion. Mitral Valve: The mitral valve is normal in structure. Trivial mitral valve regurgitation. No evidence of mitral valve stenosis. Tricuspid Valve: The tricuspid valve is normal in  structure. Tricuspid valve regurgitation is trivial. No evidence of tricuspid stenosis. Aortic Valve: The aortic valve is tricuspid. Aortic valve regurgitation is not visualized. No aortic stenosis is present. Pulmonic Valve: The pulmonic valve was normal in structure. Pulmonic valve regurgitation is trivial. No evidence of pulmonic stenosis. Aorta: The aortic root is normal in size and structure. Venous: The inferior vena cava is normal in size with greater than 50% respiratory variability, suggesting right atrial pressure of 3 mmHg. IAS/Shunts: No atrial level shunt detected by color flow Doppler.  LEFT VENTRICLE PLAX 2D LVIDd:         5.25 cm     Diastology LVIDs:         3.80 cm     LV e' medial:    9.79 cm/s LV PW:         0.95 cm     LV E/e' medial:  6.3 LV IVS:        1.47 cm     LV e' lateral:   10.90 cm/s LVOT diam:     2.40 cm     LV E/e' lateral: 5.6 LV SV:         83 LV SV Index:   39 LVOT Area:     4.52 cm                             3D Volume EF: LV Volumes (MOD)           3D EF:        56 % LV vol d, MOD A2C: 79.6 ml LV EDV:       138 ml LV vol d, MOD A4C: 89.4 ml LV ESV:       61 ml LV vol s, MOD A2C: 36.4 ml LV SV:        77 ml LV vol s, MOD A4C: 37.1 ml LV SV MOD A2C:     43.2 ml LV SV MOD A4C:     89.4 ml LV SV MOD BP:      49.6 ml RIGHT VENTRICLE             IVC RV S prime:     10.60 cm/s  IVC diam: 2.10 cm TAPSE (M-mode): 1.5 cm LEFT ATRIUM             Index        RIGHT ATRIUM           Index LA diam:        2.60 cm 1.23 cm/m   RA Area:     13.60 cm LA Vol (A2C):   18.4 ml 8.71 ml/m   RA Volume:   34.70 ml   16.43 ml/m LA Vol (A4C):   23.2 ml 10.98 ml/m LA Biplane Vol: 21.8 ml 10.32 ml/m  AORTIC VALVE             PULMONIC VALVE LVOT Vmax:   117.00 cm/s PR End Diast Vel: 1.49 msec LVOT Vmean:  78.200 cm/s LVOT VTI:    0.183 m  AORTA Ao Root diam: 3.50 cm Ao Asc diam:  3.20 cm MITRAL VALVE MV Area (PHT): 5.75 cm    SHUNTS MV Decel Time: 132 msec    Systemic VTI:  0.18 m MV E velocity: 61.40 cm/s  Systemic Diam: 2.40 cm MV A velocity: 70.75 cm/s MV E/A ratio:  0.87 Kirk Ruths MD Electronically signed by Kirk Ruths MD Signature Date/Time:  02/06/2021/12:27:18 PM    Final     Microbiology: Recent Results (from the past 240 hour(s))  Resp Panel by RT-PCR (Flu A&B, Covid) Urine, Clean Catch     Status: None   Collection Time: 02/04/21  8:35 AM   Specimen: Urine, Clean Catch; Nasopharyngeal(NP) swabs in vial transport medium  Result Value Ref Range Status   SARS Coronavirus 2 by RT PCR NEGATIVE NEGATIVE Final    Comment: (NOTE) SARS-CoV-2 target nucleic acids are NOT DETECTED.  The SARS-CoV-2 RNA is generally detectable in upper respiratory specimens during the acute phase of infection. The lowest concentration of SARS-CoV-2 viral copies this assay can detect is 138 copies/mL. A negative result does not preclude SARS-Cov-2 infection and should not be used as the sole basis for treatment or other patient management decisions. A negative result may occur with  improper specimen collection/handling, submission of specimen other than nasopharyngeal swab, presence of viral mutation(s) within the areas targeted by this assay, and inadequate number of viral copies(<138 copies/mL). A negative result must be combined with clinical observations, patient history, and epidemiological information. The expected result is Negative.  Fact Sheet for Patients:  EntrepreneurPulse.com.au  Fact Sheet for Healthcare Providers:  IncredibleEmployment.be  This test is no t yet  approved or cleared by the [place] FDA and  has been authorized for detection and/or diagnosis of SARS-CoV-2 by FDA under an Emergency Use Authorization (EUA). This EUA will remain  in effect (meaning this test can be used) for the duration of the COVID-19 declaration under Section 564(b)(1) of the Act, 21 U.S.C.section 360bbb-3(b)(1), unless the authorization is terminated  or revoked sooner.       Influenza A by PCR NEGATIVE NEGATIVE Final   Influenza B by PCR NEGATIVE NEGATIVE Final    Comment: (NOTE) The Xpert Xpress SARS-CoV-2/FLU/RSV plus assay is intended as an aid in the diagnosis of influenza from Nasopharyngeal swab specimens and should not be used as a sole basis for treatment. Nasal washings and aspirates are unacceptable for Xpert Xpress SARS-CoV-2/FLU/RSV testing.  Fact Sheet for Patients: EntrepreneurPulse.com.au  Fact Sheet for Healthcare Providers: IncredibleEmployment.be  This test is not yet approved or cleared by the [place] FDA and has been authorized for detection and/or diagnosis of SARS-CoV-2 by FDA under an Emergency Use Authorization (EUA). This EUA will remain in effect (meaning this test can be used) for the duration of the COVID-19 declaration under Section 564(b)(1) of the Act, 21 U.S.C. section 360bbb-3(b)(1), unless the authorization is terminated or revoked.  Performed at [facility], [address] [place], [place] [address]   MRSA Next Gen by PCR, Nasal     Status: None   Collection Time: 02/04/21  6:10 PM   Specimen: Nasal Mucosa; Nasal Swab  Result Value Ref Range Status   MRSA by PCR Next Gen NOT DETECTED NOT DETECTED Final    Comment: (NOTE) The GeneXpert MRSA Assay (FDA approved for NASAL specimens only), is one component of a comprehensive MRSA colonization surveillance program. It is not intended to diagnose MRSA infection nor to guide or monitor treatment for MRSA  infections. Test performance is not FDA approved in patients less than 49 years old. Performed at [facility], [place] [address] [place], [place] [address]      Labs: Basic Metabolic Panel: Recent Labs  Lab 02/04/21 1821 02/05/21 0034 02/05/21 0511 02/05/21 0928 02/06/21 0255  NA 148* 142 138 135 139  K 3.9 3.6 3.7 3.6 3.3*  CL 115* 110 106 103 107  CO2 24 23 20* 24 25  GLUCOSE 236* 189* 410* 500* 176*  BUN 34* 29* 28* 28* 18  CREATININE 1.45* 1.11 1.20 1.29* 1.00  CALCIUM 9.8 8.9 8.6* 8.7* 8.2*  MG  --   --  2.2  --   --    Liver Function Tests: Recent Labs  Lab 02/04/21 0903 02/05/21 0511  AST 21 22  ALT 30 27  ALKPHOS 100 50  BILITOT 1.6* 1.2  PROT 10.0* 6.5  ALBUMIN 4.9 3.6   No results for input(s): LIPASE, AMYLASE in the last 168 hours. No results for input(s): AMMONIA in the last 168 hours. CBC: Recent Labs  Lab 02/04/21 0832 02/04/21 0903 02/05/21 0511 02/06/21 0255  WBC  --  6.2 7.7 5.8  NEUTROABS  --  5.5 5.2 2.5  HGB 18.4* 14.9 12.4* 11.6*  HCT 54.0* 50.9 40.8 37.8*  MCV  --  74.2* 72.6* 72.4*  PLT  --  229 166 156   Cardiac Enzymes: No results for input(s): CKTOTAL, CKMB, CKMBINDEX, TROPONINI in the last 168 hours. BNP: BNP (last 3 results) No results for input(s): BNP in the last 8760 hours.  ProBNP (last 3 results) No results for input(s): PROBNP in the last 8760 hours.  CBG: Recent Labs  Lab 02/05/21 2004 02/05/21 2318 02/06/21 0342 02/06/21 0747 02/06/21 1235  GLUCAP 304* 164* 173* 173* 271*       Signed:  Alma Friendly, MD [facility] 02/06/2021, 2:13 PM

## 2021-02-06 NOTE — Progress Notes (Addendum)
Inpatient Diabetes Program Recommendations  AACE/ADA: New Consensus Statement on Inpatient Glycemic Control (2015)  Target Ranges:  Prepandial:   less than 140 mg/dL      Peak postprandial:   less than 180 mg/dL (1-2 hours)      Critically ill patients:  140 - 180 mg/dL    Latest Reference Range & Units 02/05/21 07:55 02/05/21 11:29 02/05/21 16:53 02/05/21 20:04  Glucose-Capillary 70 - 99 mg/dL 300 (H)  8 units Novolog  10 units Semglee  336 (H)  11 units Novolog  295 (H)  11 units Novolog  304 (H)  11 units Novolog  15 units Semglee @2222      Latest Reference Range & Units 02/06/21 07:47 02/06/21 12:35  Glucose-Capillary 70 - 99 mg/dL 173 (H)  6 units Novolog  271 (H)  11 units Novolog  15 units Semglee @1048    Admit with: DKA   History: DM2   Home DM Meds: Farxiga 10 mg daily                             Metformin 500 mg daily                             Rybelsus 14 mg daily   Current Orders: Semglee 10 units BID                            Novolog Moderate Correction Scale/ SSI (0-15 units) Q4 hours     Endocrinologist: Dr. Sarita Bottom seen 11/01/2020--Rybelsus oral med dose was increased at that visit   According to MD notes: Pt is in-between health insurance due to starting a new job. Will need vial insulin and syringes. Recommended Relion brand from [place]. He will need to followup with PCP for insulin titration and getting back onto oral meds.  Will need to stop oral DM meds at discharge and switch to basal/bolus insulin regimen until he sees his PCP  Note conversation with Dr. Bridgett Larsson and pt about starting basal/bolus Insulin regimen for home until pt can get back to see his Endocrinologist when his insurance starts at his new job.  Note that Reli-On insulin from [employer] recommended by MD.    MD- Based on CBGs and Insulin needs over the last 24 hours, recommend the following:  Reli-On NPH (N) Insulin 15 units BID (Breakfast and Bedtime)  Reli-On  Regular (R) Insulin-- Could send home on set dose of Novlog food coverage or could send pt home with hospital SSI until pt follows up with Endocrinologist:  Recommend either Regular Insulin 6 units TID with meals (only give half if eating small meal or hold if unable to eat) Or  Could send home with Regular SSI TID before meals: 120-150- 2 units  151-200- 3 units 201-250- 5 units 251-300- 8 units 301-350- 11 units 351-400- 15 units   Order Numbers for these insulins are: NPH ([id number]) Regular ([id number]) Insulin Syringes (01779)    Addendum 2:50pm--Called pt's wife by phone this afternoon (this diabetes RN is covering both [place] and [place] [facility] and is currently in [place]--Dr. Horris Latino aware and is OK with me calling wife and pt to review insulin for home in order to not delay d/c home).    Reviewed discharge insulins with wife.  Explained what NPH and Regular are, how they work, when to give, etc.  Encouraged pt to  try to take the Regular insulin at least 20-30 minutes prior to eating to allow the R insulin time to start working prior to food intake.  Explained how the R insulin is meant to cover food intake and if pt only eating very small amount to only give half (3 units) or to hold if unable to eat. Also explained to wife that pt can take small amount of the R insulin (1-2 units) if unable to eat and CBG >200.  Wife stated she and pt do not want pt to follow up with Dr. Buelah Manis prefer different ENDO.  Explained to wife that [facility] has 3 other ENDOs they could try switching to or pt could call Dr. Buddy Duty with [facility] for appt.  Also reviewed signs and symptoms of Hypoglycemia and how to treat HYPO at home.      --Will follow patient during hospitalization--  Wyn Quaker RN, MSN, CDE Diabetes Coordinator Inpatient Glycemic Control Team Team Pager: [phone] (8a-5p)

## 2021-02-06 NOTE — Progress Notes (Signed)
Discharge paper given to patient and spouse at bedside. Patient verbalized understanding. Patient's IV was removed by Anderson Malta NA and patient was taken down to the front of the hospital @ 1520 in a wheelchair where their spouse picked them up.

## 2021-02-10 ENCOUNTER — Ambulatory Visit (INDEPENDENT_AMBULATORY_CARE_PROVIDER_SITE_OTHER): Payer: Self-pay | Admitting: Primary Care

## 2021-02-10 ENCOUNTER — Encounter (INDEPENDENT_AMBULATORY_CARE_PROVIDER_SITE_OTHER): Payer: Self-pay | Admitting: Primary Care

## 2021-02-10 ENCOUNTER — Other Ambulatory Visit: Payer: Self-pay

## 2021-02-10 ENCOUNTER — Telehealth: Payer: Self-pay

## 2021-02-10 VITALS — BP 106/68 | HR 98 | Temp 97.5°F | Ht 74.0 in | Wt 207.1 lb

## 2021-02-10 DIAGNOSIS — R739 Hyperglycemia, unspecified: Secondary | ICD-10-CM

## 2021-02-10 DIAGNOSIS — E119 Type 2 diabetes mellitus without complications: Secondary | ICD-10-CM

## 2021-02-10 LAB — HEMOGLOBIN A1C
Hgb A1c MFr Bld: 15 % — ABNORMAL HIGH (ref 4.8–5.6)
Mean Plasma Glucose: 384 mg/dL

## 2021-02-10 LAB — POCT URINALYSIS DIP (CLINITEK)
Blood, UA: NEGATIVE
Glucose, UA: 500 mg/dL — AB
Leukocytes, UA: NEGATIVE
Nitrite, UA: NEGATIVE
POC PROTEIN,UA: 100 — AB
Spec Grav, UA: >=1.03 — AB (ref 1.010–1.025)
Urobilinogen, UA: 0.2 E.U./dL
pH, UA: 5.5 (ref 5.0–8.0)

## 2021-02-10 LAB — GLUCOSE, POCT (MANUAL RESULT ENTRY): POC Glucose: 298 mg/dl — AB (ref 70–99)

## 2021-02-10 MED ORDER — INSULIN NPH (HUMAN) (ISOPHANE) 100 UNIT/ML ~~LOC~~ SUSP: 15.0000 [IU] | Freq: Two times a day (BID) | SUBCUTANEOUS | 3 refills | Status: DC

## 2021-02-10 MED ORDER — INSULIN REGULAR HUMAN 100 UNIT/ML IJ SOLN
6.0000 [IU] | Freq: Three times a day (TID) | INTRAMUSCULAR | 3 refills | Status: DC
Start: 2021-02-10 — End: 2021-03-08

## 2021-02-10 NOTE — Patient Instructions (Addendum)
Follow up with EndocrinologistDiabetes Mellitus and Nutrition, Adult When you have diabetes, or diabetes mellitus, it is very important to have healthy eating habits because your blood sugar (glucose) levels are greatly affected by what you eat and drink. Eating healthy foods in the right amounts, at about the same times every day, can help you: Manage your blood glucose. Lower your risk of heart disease. Improve your blood pressure. Reach or maintain a healthy weight. What can affect my meal plan? Every person with diabetes is different, and each person has different needs for a meal plan. Your health care provider may recommend that you work with a dietitian to make a meal plan that is best for you. Your meal plan may vary depending on factors such as: The calories you need. The medicines you take. Your weight. Your blood glucose, blood pressure, and cholesterol levels. Your activity level. Other health conditions you have, such as heart or kidney disease. How do carbohydrates affect me? Carbohydrates, also called carbs, affect your blood glucose level more than any other type of food. Eating carbs raises the amount of glucose in your blood. It is important to know how many carbs you can safely have in each meal. This is different for every person. Your dietitian can help you calculate how many carbs you should have at each meal and for each snack. How does alcohol affect me? Alcohol can cause a decrease in blood glucose (hypoglycemia), especially if you use insulin or take certain diabetes medicines by mouth. Hypoglycemia can be a life-threatening condition. Symptoms of hypoglycemia, such as sleepiness, dizziness, and confusion, are similar to symptoms of having too much alcohol. Do not drink alcohol if: Your health care provider tells you not to drink. You are pregnant, may be pregnant, or are planning to become pregnant. If you drink alcohol: Limit how much you have to: 0-1 drink a day  for women. 0-2 drinks a day for men. Know how much alcohol is in your drink. In the [place] one drink equals one 12 oz bottle of beer (355 mL), one 5 oz glass of wine (148 mL), or one 1 oz glass of hard liquor (44 mL). Keep yourself hydrated with water, diet soda, or unsweetened iced tea. Keep in mind that regular soda, juice, and other mixers may contain a lot of sugar and must be counted as carbs. What are tips for following this plan? Reading food labels Start by checking the serving size on the Nutrition Facts label of packaged foods and drinks. The number of calories and the amount of carbs, fats, and other nutrients listed on the label are based on one serving of the item. Many items contain more than one serving per package. Check the total grams (g) of carbs in one serving. Check the number of grams of saturated fats and trans fats in one serving. Choose foods that have a low amount or none of these fats. Check the number of milligrams (mg) of salt (sodium) in one serving. Most people should limit total sodium intake to less than 2,300 mg per day. Always check the nutrition information of foods labeled as "low-fat" or "nonfat." These foods may be higher in added sugar or refined carbs and should be avoided. Talk to your dietitian to identify your daily goals for nutrients listed on the label. Shopping Avoid buying canned, pre-made, or processed foods. These foods tend to be high in fat, sodium, and added sugar. Shop around the outside edge of the grocery store. This is  where you will most often find fresh fruits and vegetables, bulk grains, fresh meats, and fresh dairy products. Cooking Use low-heat cooking methods, such as baking, instead of high-heat cooking methods, such as deep frying. Cook using healthy oils, such as olive, canola, or sunflower oil. Avoid cooking with butter, cream, or high-fat meats. Meal planning Eat meals and snacks regularly, preferably at the same times every  day. Avoid going long periods of time without eating. Eat foods that are high in fiber, such as fresh fruits, vegetables, beans, and whole grains. Eat 4-6 oz (112-168 g) of lean protein each day, such as lean meat, chicken, fish, eggs, or tofu. One ounce (oz) (28 g) of lean protein is equal to: 1 oz (28 g) of meat, chicken, or fish. 1 egg.  cup (62 g) of tofu. Eat some foods each day that contain healthy fats, such as avocado, nuts, seeds, and fish. What foods should I eat? Fruits Berries. Apples. Oranges. Peaches. Apricots. Plums. Grapes. Mangoes. Papayas. Pomegranates. Kiwi. Cherries. Vegetables Leafy greens, including lettuce, spinach, kale, chard, collard greens, mustard greens, and cabbage. Beets. Cauliflower. Broccoli. Carrots. Green beans. Tomatoes. Peppers. Onions. Cucumbers. Brussels sprouts. Grains Whole grains, such as whole-wheat or whole-grain bread, crackers, tortillas, cereal, and pasta. Unsweetened oatmeal. Quinoa. Brown or wild G[place]. Meats and other proteins Seafood. Poultry without skin. Lean cuts of poultry and beef. Tofu. Nuts. Seeds. Dairy Low-fat or fat-free dairy products such as milk, yogurt, and cheese. The items listed above may not be a complete list of foods and beverages you can eat and drink. Contact a dietitian for more information. What foods should I avoid? Fruits Fruits canned with syrup. Vegetables Canned vegetables. Frozen vegetables with butter or cream sauce. Grains Refined white flour and flour products such as bread, pasta, snack foods, and cereals. Avoid all processed foods. Meats and other proteins Fatty cuts of meat. Poultry with skin. Breaded or fried meats. Processed meat. Avoid saturated fats. Dairy Full-fat yogurt, cheese, or milk. Beverages Sweetened drinks, such as soda or iced tea. The items listed above may not be a complete list of foods and beverages you should avoid. Contact a dietitian for more information. Questions to ask a  health care provider Do I need to meet with a certified diabetes care and education specialist? Do I need to meet with a dietitian? What number can I call if I have questions? When are the best times to check my blood glucose? Where to find more information: American Diabetes Association: diabetes.org Academy of Nutrition and Dietetics: eatright.[employer] of Diabetes and Digestive and Kidney Diseases: AmenCredit.is Association of Diabetes Care & Education Specialists: diabeteseducator.org Summary It is important to have healthy eating habits because your blood sugar (glucose) levels are greatly affected by what you eat and drink. It is important to use alcohol carefully. A healthy meal plan will help you manage your blood glucose and lower your risk of heart disease. Your health care provider may recommend that you work with a dietitian to make a meal plan that is best for you. This information is not intended to replace advice given to you by your health care provider. Make sure you discuss any questions you have with your health care provider. Document Revised: 09/02/2019 Document Reviewed: 09/02/2019 Elsevier Patient Education  2022 [employer].  and PCP

## 2021-02-10 NOTE — Telephone Encounter (Signed)
Direction with continuous Insulin injection with being released from the HOS.   Pt and wife is requesting a call back with further instruction.

## 2021-02-10 NOTE — Progress Notes (Signed)
[facility]   Subjective:  [name] is a 50 y.o. male presents for hospital follow up, he has a PCP. Presents to the ER on 02/04/21 with a 2-week history of dysgeusia.  Patient states that everything he eats or drinks taste like cardboard.  His wife had noticed that he had lost  weight in the last few days.  Patient normally drinks about a gallon of water a day but has been having polydipsia and polyuria for about 2 weeks. Admit date to the hospital was 02/04/21, patient was discharged from the hospital on 02/06/21, patient was admitted for: Diabetes mellitus type II, non insulin dependent,DKA (diabetic ketoacidosis) and AKI (acute kidney injury). Today, he feels a lot better he was having warning sign prior to admission, polyuria and polydipsia and ignored them. Denies shortness of breath, headaches, chest pain or lower extremity edema   Past Medical History:  Diagnosis Date   Chronic headaches    Diabetes mellitus    Hodgkin's lymphoma (HCC)    Hyperlipidemia      No Known Allergies    Current Outpatient Medications on File Prior to Visit  Medication Sig Dispense Refill   aspirin 81 MG EC tablet Take 1 tablet (81 mg total) by mouth daily. Swallow whole. 30 tablet 12   glucose blood test strip And lancets 1/day 250.00 100 each 12   insulin NPH Human (NOVOLIN N RELION) 100 UNIT/ML injection Inject 0.15 mLs (15 Units total) into the skin 2 (two) times daily at 8 am and 10 pm. 9 mL 0   insulin regular (NOVOLIN R RELION) 100 units/mL injection Inject 0.06 mLs (6 Units total) into the skin 3 (three) times daily before meals. Only give half (3U) with meals if eating small meal or hold if unable to eat 5.4 mL 0   Insulin Syringes, Disposable, U-100 0.3 ML MISC Insulin syringes 100 each 0   lisinopril (ZESTRIL) 5 MG tablet TAKE 1 TABLET DAILY (Patient taking differently: Take 5 mg by mouth daily. TAKE 1 TABLET DAILY) 90 tablet 3   pravastatin (PRAVACHOL) 40 MG tablet TAKE  1 TABLET DAILY (Patient taking differently: Take 40 mg by mouth daily. TAKE 1 TABLET DAILY) 90 tablet 3   tadalafil (CIALIS) 20 MG tablet Take 0.5-1 tablets (10-20 mg total) by mouth every other day as needed for erectile dysfunction. 5 tablet 11   No current facility-administered medications on file prior to visit.     Review of System: Comprehensive review of system pertinent positives and negatives noted in HPI  Objective:  BP 106/68 (BP Location: Right Arm, Patient Position: Sitting, Cuff Size: Large)    Pulse 98    Temp (!) 97.5 F (36.4 C) (Oral)    Ht 6\' 2"  (1.88 m)    Wt 207 lb 1.6 oz (93.9 kg)    SpO2 97%    BMI 26.59 kg/m   Filed Weights   02/10/21 0956  Weight: 207 lb 1.6 oz (93.9 kg)   Physical Exam: General Appearance: Well nourished, slightly overweight with BMI 26.59 in no apparent distress. Eyes: PERRLA, EOMs, conjunctiva no swelling or erythema Sinuses: No Frontal/maxillary tenderness ENT/Mouth: Ext aud canals clear, TMs without erythema, bulging.Hearing normal.  Neck: Supple, thyroid normal.  Respiratory: Respiratory effort normal, BS equal bilaterally without rales, rhonchi, wheezing or stridor.  Cardio: RRR with no MRGs. Brisk peripheral pulses without edema.  Abdomen: Soft, + BS.  Non tender, no guarding, rebound, hernias, masses. Lymphatics: Non tender without lymphadenopathy.  Musculoskeletal: Full ROM, 5/5 strength, normal gait.  Skin: Warm, dry without rashes, lesions, ecchymosis.  Neuro: Cranial nerves intact. Normal muscle tone, no cerebellar symptoms. Sensation intact.  Psych: Awake and oriented X 3, normal affect, Insight and Judgment appropriate.    Assessment:  Caleel was seen today for hospitalization follow-up.  Diagnoses and all orders for this visit:  Type 2 diabetes mellitus without complication, without long-term current use of insulin (HCC) Discussed  co- morbidities with uncontrol diabetes  Complications -diabetic retinopathy, (close  your eyes ? What do you see nothing) nephropathy decrease in kidney function- can lead to dialysis-on a machine 3 days a week to filter your kidney, neuropathy- numbness and tinging in your hands and feet,  increase risk of heart attack and stroke, and amputation due to decrease wound healing and circulation. Decrease your risk by taking medication daily as prescribed, monitor carbohydrates- foods that are high in carbohydrates are the following [place], potatoes, breads, sugars, and pastas.  Reduction in the intake (eating) will assist in lowering your blood sugars. Exercise daily at least 30 minutes daily.  -     Glucose (CBG)  Hyperglycemia -     POCT URINALYSIS DIP (CLINITEK)  Other orders -     insulin NPH Human (NOVOLIN N RELION) 100 UNIT/ML injection; Inject 0.15 mLs (15 Units total) into the skin 2 (two) times daily at 8 am and 10 pm. -     insulin regular (NOVOLIN R RELION) 100 units/mL injection; Inject 0.06 mLs (6 Units total) into the skin 3 (three) times daily before meals. Only give half (3U) with meals if eating small meal or hold if unable to eat    This note has been created with Surveyor, quantity. Any transcriptional errors are unintentional.   Kerin Perna, NP 02/10/2021, 10:34 AM

## 2021-02-14 ENCOUNTER — Telehealth: Payer: Self-pay | Admitting: Internal Medicine

## 2021-02-14 NOTE — Telephone Encounter (Signed)
Pt stated that he is in between insurances and in the grace period at his new employer. He stated that he would keep his scheduled OV on 1/25 @ 8am to discuss his recent ER visit. I offered to move the appointment up sooner for his to discuss concerns with PCP but he declined.

## 2021-02-14 NOTE — Telephone Encounter (Signed)
Copied from [place] [id number]. Topic: Appointment Scheduling - Scheduling Inquiry for Clinic >> Feb 10, 2021 12:40 PM Greggory Keen D wrote: Reason for CRM: Pt needs to schedule an appt for an orange card .  He had an appt on 12/30 and he does not have insurance.  CB#  [phone]

## 2021-02-14 NOTE — Telephone Encounter (Signed)
I return Pt call, LVM to call back

## 2021-02-20 ENCOUNTER — Ambulatory Visit: Payer: Self-pay

## 2021-02-23 ENCOUNTER — Encounter: Payer: Self-pay | Admitting: Internal Medicine

## 2021-02-23 DIAGNOSIS — E119 Type 2 diabetes mellitus without complications: Secondary | ICD-10-CM

## 2021-03-02 NOTE — Telephone Encounter (Signed)
Patient checking status of referral  *see below*  Patient states he would like to see endocrinologist by 03-08-2021

## 2021-03-07 ENCOUNTER — Ambulatory Visit: Payer: BC Managed Care – PPO | Admitting: Endocrinology

## 2021-03-08 ENCOUNTER — Encounter: Payer: Self-pay | Admitting: Internal Medicine

## 2021-03-08 ENCOUNTER — Other Ambulatory Visit: Payer: Self-pay

## 2021-03-08 ENCOUNTER — Ambulatory Visit (INDEPENDENT_AMBULATORY_CARE_PROVIDER_SITE_OTHER): Payer: Commercial Managed Care - PPO | Admitting: Internal Medicine

## 2021-03-08 VITALS — BP 110/68 | HR 84 | Ht 74.0 in | Wt 208.0 lb

## 2021-03-08 DIAGNOSIS — M545 Low back pain, unspecified: Secondary | ICD-10-CM | POA: Insufficient documentation

## 2021-03-08 DIAGNOSIS — E119 Type 2 diabetes mellitus without complications: Secondary | ICD-10-CM

## 2021-03-08 DIAGNOSIS — E559 Vitamin D deficiency, unspecified: Secondary | ICD-10-CM

## 2021-03-08 DIAGNOSIS — Z1211 Encounter for screening for malignant neoplasm of colon: Secondary | ICD-10-CM

## 2021-03-08 DIAGNOSIS — E782 Mixed hyperlipidemia: Secondary | ICD-10-CM | POA: Diagnosis not present

## 2021-03-08 DIAGNOSIS — E538 Deficiency of other specified B group vitamins: Secondary | ICD-10-CM

## 2021-03-08 DIAGNOSIS — Z0001 Encounter for general adult medical examination with abnormal findings: Secondary | ICD-10-CM

## 2021-03-08 HISTORY — DX: Low back pain, unspecified: M54.50

## 2021-03-08 HISTORY — DX: Encounter for general adult medical examination with abnormal findings: Z00.01

## 2021-03-08 LAB — CBC WITH DIFFERENTIAL/PLATELET
Basophils Absolute: 0 10*3/uL (ref 0.0–0.1)
Basophils Relative: 0.3 % (ref 0.0–3.0)
Eosinophils Absolute: 0 10*3/uL (ref 0.0–0.7)
Eosinophils Relative: 1.4 % (ref 0.0–5.0)
HCT: 39.6 % (ref 39.0–52.0)
Hemoglobin: 12.3 g/dL — ABNORMAL LOW (ref 13.0–17.0)
Lymphocytes Relative: 51.9 % — ABNORMAL HIGH (ref 12.0–46.0)
Lymphs Abs: 1.4 10*3/uL (ref 0.7–4.0)
MCHC: 31 g/dL (ref 30.0–36.0)
MCV: 70.4 fl — ABNORMAL LOW (ref 78.0–100.0)
Monocytes Absolute: 0.2 10*3/uL (ref 0.1–1.0)
Monocytes Relative: 8.9 % (ref 3.0–12.0)
Neutro Abs: 1 10*3/uL — ABNORMAL LOW (ref 1.4–7.7)
Neutrophils Relative %: 37.5 % — ABNORMAL LOW (ref 43.0–77.0)
Platelets: 208 10*3/uL (ref 150.0–400.0)
RBC: 5.63 Mil/uL (ref 4.22–5.81)
RDW: 15.7 % — ABNORMAL HIGH (ref 11.5–15.5)
WBC: 2.7 10*3/uL — ABNORMAL LOW (ref 4.0–10.5)

## 2021-03-08 LAB — BASIC METABOLIC PANEL
BUN: 12 mg/dL (ref 6–23)
CO2: 30 mEq/L (ref 19–32)
Calcium: 9.5 mg/dL (ref 8.4–10.5)
Chloride: 104 mEq/L (ref 96–112)
Creatinine, Ser: 1.1 mg/dL (ref 0.40–1.50)
GFR: 78.96 mL/min (ref >60.00)
Glucose, Bld: 217 mg/dL — ABNORMAL HIGH (ref 70–99)
Potassium: 4.3 mEq/L (ref 3.5–5.1)
Sodium: 141 mEq/L (ref 135–145)

## 2021-03-08 LAB — LIPID PANEL
Cholesterol: 154 mg/dL (ref 0–200)
HDL: 49.8 mg/dL (ref >39.00)
LDL Cholesterol: 85 mg/dL (ref 0–99)
NonHDL: 104.16
Total CHOL/HDL Ratio: 3
Triglycerides: 96 mg/dL (ref 0.0–149.0)
VLDL: 19.2 mg/dL (ref 0.0–40.0)

## 2021-03-08 LAB — URINALYSIS, ROUTINE W REFLEX MICROSCOPIC
Bilirubin Urine: NEGATIVE
Hgb urine dipstick: NEGATIVE
Ketones, ur: NEGATIVE
Leukocytes,Ua: NEGATIVE
Nitrite: NEGATIVE
RBC / HPF: NONE SEEN (ref 0–?)
Specific Gravity, Urine: 1.025 (ref 1.000–1.030)
Total Protein, Urine: NEGATIVE
Urine Glucose: >=1000 — AB
Urobilinogen, UA: 0.2 (ref 0.0–1.0)
pH: 6 (ref 5.0–8.0)

## 2021-03-08 LAB — TSH: TSH: 1.24 u[IU]/mL (ref 0.35–5.50)

## 2021-03-08 LAB — HEPATIC FUNCTION PANEL
ALT: 28 U/L (ref 0–53)
AST: 15 U/L (ref 0–37)
Albumin: 4.2 g/dL (ref 3.5–5.2)
Alkaline Phosphatase: 45 U/L (ref 39–117)
Bilirubin, Direct: 0.1 mg/dL (ref 0.0–0.3)
Total Bilirubin: 0.5 mg/dL (ref 0.2–1.2)
Total Protein: 7 g/dL (ref 6.0–8.3)

## 2021-03-08 LAB — MICROALBUMIN / CREATININE URINE RATIO
Creatinine,U: 191.4 mg/dL
Microalb Creat Ratio: 1.9 mg/g (ref 0.0–30.0)
Microalb, Ur: 3.7 mg/dL — ABNORMAL HIGH (ref 0.0–1.9)

## 2021-03-08 LAB — VITAMIN D 25 HYDROXY (VIT D DEFICIENCY, FRACTURES): VITD: 15.51 ng/mL — ABNORMAL LOW (ref 30.00–100.00)

## 2021-03-08 LAB — PSA: PSA: 0.57 ng/mL (ref 0.10–4.00)

## 2021-03-08 LAB — HEMOGLOBIN A1C: Hgb A1c MFr Bld: 12.9 % — ABNORMAL HIGH (ref 4.6–6.5)

## 2021-03-08 LAB — VITAMIN B12: Vitamin B-12: 481 pg/mL (ref 211–911)

## 2021-03-08 MED ORDER — INSULIN SYRINGES (DISPOSABLE) U-100 0.3 ML MISC: 0 refills | Status: DC

## 2021-03-08 MED ORDER — LISINOPRIL 5 MG PO TABS: 5.0000 mg | ORAL_TABLET | Freq: Every day | ORAL | 3 refills | Status: DC

## 2021-03-08 MED ORDER — PRAVASTATIN SODIUM 40 MG PO TABS: 40.0000 mg | ORAL_TABLET | Freq: Every day | ORAL | 3 refills | Status: DC

## 2021-03-08 MED ORDER — INSULIN REGULAR HUMAN 100 UNIT/ML IJ SOLN: 6.0000 [IU] | Freq: Three times a day (TID) | INTRAMUSCULAR | 3 refills | Status: DC

## 2021-03-08 MED ORDER — GLUCOSE BLOOD VI STRP: ORAL_STRIP | 12 refills | Status: DC

## 2021-03-08 MED ORDER — INSULIN NPH (HUMAN) (ISOPHANE) 100 UNIT/ML ~~LOC~~ SUSP: 15.0000 [IU] | Freq: Two times a day (BID) | SUBCUTANEOUS | 3 refills | Status: DC

## 2021-03-08 MED ORDER — METHOCARBAMOL 500 MG PO TABS: 500.0000 mg | ORAL_TABLET | Freq: Four times a day (QID) | ORAL | 2 refills | Status: DC | PRN

## 2021-03-08 NOTE — Patient Instructions (Addendum)
Please take all new medication as prescribed - the robaxin for the muscle pain  Please continue all other medications as before, and refills have been done if requested.  Please have the pharmacy call with any other refills you may need.  Please continue your efforts at being more active, low cholesterol diet, and weight control.  You are otherwise up to date with prevention measures today.  Please keep your appointments with your specialists as you may have planned   You will be contacted regarding the referral for: endocrinology, eye doctor, and colonoscopy  Please go to the LAB at the blood drawing area for the tests to be done  You will be contacted by phone if any changes need to be made immediately.  Otherwise, you will receive a letter about your results with an explanation, but please check with MyChart first.  Please remember to sign up for MyChart if you have not done so, as this will be important to you in the future with finding out test results, communicating by private email, and scheduling acute appointments online when needed.  Please make an Appointment to return in 6 months, or sooner if needed

## 2021-03-08 NOTE — Progress Notes (Signed)
Patient ID: William Sheppard, male   DOB: 1970-07-27, 51 y.o.   MRN: [id number]         Chief Complaint:: wellness exam and Hospitalization Follow-up (High glucose)         HPI:  William Sheppard is a 51 y.o. male here for wellness exam; due for yearly eye exam and colonoscopy, declines covid booster, flu shot and tdap                         Also Pt denies chest pain, increased sob or doe, wheezing, orthopnea, PND, increased LE swelling, palpitations, dizziness or syncope.   Pt denies polydipsia, polyuria, or low sugar symptoms with CBG's in the 79-170 range.  Plans to f/u with endo as well.  Pt continues to have recurring right LBP for 1 wk mild to mod, but no bowel or bladder change, fever, wt loss,  worsening LE pain/numbness/weakness, gait change or falls.  Pt denies fever, wt loss, night sweats, loss of appetite, or other constitutional symptoms     Wt Readings from Last 3 Encounters:  03/08/21 208 lb (94.3 kg)  02/10/21 207 lb 1.6 oz (93.9 kg)  02/04/21 186 lb 15.2 oz (84.8 kg)   BP Readings from Last 3 Encounters:  03/08/21 110/68  02/10/21 106/68  02/06/21 129/81   Immunization History  Administered Date(s) Administered   PFIZER(Purple Top)SARS-COV-2 Vaccination 05/30/2019, 06/21/2019   Health Maintenance Due  Topic Date Due   COLONOSCOPY (Pts 45-19yrs Insurance coverage will need to be confirmed)  Never done   OPHTHALMOLOGY EXAM  03/18/2019      Past Medical History:  Diagnosis Date   Chronic headaches    Diabetes mellitus    Hodgkin's lymphoma ([place])    Hyperlipidemia    Past Surgical History:  Procedure Laterality Date   PORTACATH PLACEMENT  1999   WISDOM TOOTH EXTRACTION      reports that he has never smoked. He has never used smokeless tobacco. He reports current alcohol use. He reports that he does not use drugs. family history is not on file. He was adopted. No Known Allergies Current Outpatient Medications on File Prior to Visit  Medication Sig Dispense Refill    tadalafil (CIALIS) 20 MG tablet Take 0.5-1 tablets (10-20 mg total) by mouth every other day as needed for erectile dysfunction. 5 tablet 11   No current facility-administered medications on file prior to visit.        ROS:  All others reviewed and negative.  Objective        PE:  BP 110/68 (BP Location: Right Arm, Patient Position: Sitting, Cuff Size: Large)    Pulse 84    Ht 6\' 2"  (1.88 m)    Wt 208 lb (94.3 kg)    SpO2 98%    BMI 26.71 kg/m                 Constitutional: Pt appears in NAD               HENT: Head: NCAT.                Right Ear: External ear normal.                 Left Ear: External ear normal.                Eyes: . Pupils are equal, round, and reactive to light. Conjunctivae and EOM are normal  Nose: without d/c or deformity               Neck: Neck supple. Gross normal ROM               Cardiovascular: Normal rate and regular rhythm.                 Pulmonary/Chest: Effort normal and breath sounds without rales or wheezing.                Abd:  Soft, NT, ND, + BS, no organomegaly; right lumbar paravertebral tender spasm noted               Neurological: Pt is alert. At baseline orientation, motor grossly intact               Skin: Skin is warm. No rashes, no other new lesions, LE edema - none               Psychiatric: Pt behavior is normal without agitation   Micro: none  Cardiac tracings I have personally interpreted today:  none  Pertinent Radiological findings (summarize): none   Lab Results  Component Value Date   WBC 2.7 (L) 03/08/2021   HGB 12.3 (L) 03/08/2021   HCT 39.6 03/08/2021   PLT 208.0 03/08/2021   GLUCOSE 217 (H) 03/08/2021   CHOL 154 03/08/2021   TRIG 96.0 03/08/2021   HDL 49.80 03/08/2021   LDLDIRECT 82.0 08/26/2019   LDLCALC 85 03/08/2021   ALT 28 03/08/2021   AST 15 03/08/2021   NA 141 03/08/2021   K 4.3 03/08/2021   CL 104 03/08/2021   CREATININE 1.10 03/08/2021   BUN 12 03/08/2021   CO2 30 03/08/2021    TSH 1.24 03/08/2021   PSA 0.57 03/08/2021   HGBA1C 12.9 (H) 03/08/2021   MICROALBUR 3.7 (H) 03/08/2021   Assessment/Plan:  William Sheppard is a 51 y.o. Black or African American [2] male with  has a past medical history of Chronic headaches, Diabetes mellitus, Hodgkin's lymphoma ([place]), and Hyperlipidemia.  Vitamin D deficiency . Last vitamin D Lab Results  Component Value Date   VD25OH 31.89 08/26/2019   Low, to start oral replacement   Encounter for well adult exam with abnormal findings Age and sex appropriate education and counseling updated with regular exercise and diet Referrals for preventative services - for colonoscopy, and eye exam referrals Immunizations addressed - declines covid booster, flu shot, tdap Smoking counseling  - none needed Evidence for depression or other mood disorder - none significant Most recent labs reviewed. I have personally reviewed and have noted: 1) the patient's medical and social history 2) The patient's current medications and supplements 3) The patient's height, weight, and BMI have been recorded in the chart   Diabetes mellitus type II, non insulin dependent ([place]) Lab Results  Component Value Date   HGBA1C 12.9 (H) 03/08/2021   Recent severe uncontrolled now improved post hospn, pt to continue current medical treatment NPH and regular insulin and f/u endo as well   Hyperlipidemia Lab Results  Component Value Date   LDLCALC 85 03/08/2021   uncontrolled, goal ldl < 70, pt to continue current statin pravachol 40 and lower chol DM diet as declines change for now   Low back pain C/w msk strain - for robaxin prn muscle relaxer  Followup: Return in about 6 months (around 09/05/2021).  William Cower, MD 03/12/2021 7:58 AM [facility]

## 2021-03-08 NOTE — Assessment & Plan Note (Signed)
. °  Last vitamin D Lab Results  Component Value Date   VD25OH 31.89 08/26/2019   Low, to start oral replacement

## 2021-03-10 MED ORDER — DEXCOM G6 TRANSMITTER MISC: 3 refills | Status: DC

## 2021-03-10 MED ORDER — DEXCOM G6 SENSOR MISC: 3 refills | Status: DC

## 2021-03-10 NOTE — Telephone Encounter (Signed)
Please send dexcom g6 meter to pharmacy for patient

## 2021-03-12 ENCOUNTER — Encounter: Payer: Self-pay | Admitting: Internal Medicine

## 2021-03-12 NOTE — Assessment & Plan Note (Signed)
Lab Results  Component Value Date   [place] 85 03/08/2021   uncontrolled, goal ldl < 70, pt to continue current statin pravachol 40 and lower chol DM diet as declines change for now

## 2021-03-12 NOTE — Assessment & Plan Note (Signed)
Lab Results  Component Value Date   HGBA1C 12.9 (H) 03/08/2021   Recent severe uncontrolled now improved post hospn, pt to continue current medical treatment NPH and regular insulin and f/u endo as well

## 2021-03-12 NOTE — Assessment & Plan Note (Signed)
C/w msk strain - for robaxin prn muscle relaxer

## 2021-03-12 NOTE — Assessment & Plan Note (Signed)
Age and sex appropriate education and counseling updated with regular exercise and diet Referrals for preventative services - for colonoscopy, and eye exam referrals Immunizations addressed - declines covid booster, flu shot, tdap Smoking counseling  - none needed Evidence for depression or other mood disorder - none significant Most recent labs reviewed. I have personally reviewed and have noted: 1) the patient's medical and social history 2) The patient's current medications and supplements 3) The patient's height, weight, and BMI have been recorded in the chart

## 2021-03-15 NOTE — Telephone Encounter (Signed)
Ok I will forward this to the [facility]

## 2021-03-23 ENCOUNTER — Encounter: Payer: Self-pay | Admitting: Internal Medicine

## 2021-03-23 DIAGNOSIS — R002 Palpitations: Secondary | ICD-10-CM

## 2021-04-10 ENCOUNTER — Other Ambulatory Visit: Payer: Self-pay | Admitting: Internal Medicine

## 2021-04-10 MED ORDER — INSULIN REGULAR HUMAN 100 UNIT/ML IJ SOLN: 6.0000 [IU] | Freq: Three times a day (TID) | INTRAMUSCULAR | 3 refills | Status: DC

## 2021-04-10 MED ORDER — INSULIN NPH (HUMAN) (ISOPHANE) 100 UNIT/ML ~~LOC~~ SUSP: 15.0000 [IU] | Freq: Two times a day (BID) | SUBCUTANEOUS | 3 refills | Status: DC

## 2021-04-10 MED ORDER — INSULIN SYRINGES (DISPOSABLE) U-100 0.3 ML MISC: 0 refills | Status: AC

## 2021-04-10 NOTE — Telephone Encounter (Signed)
Ok to contact pharmacy  I think this message is asking for a medication change - but is it asking for a different brand of NPH or is patient asking for a change in therapy away from insulin?

## 2021-05-09 DIAGNOSIS — C8190 Hodgkin lymphoma, unspecified, unspecified site: Secondary | ICD-10-CM | POA: Insufficient documentation

## 2021-05-09 DIAGNOSIS — E1165 Type 2 diabetes mellitus with hyperglycemia: Secondary | ICD-10-CM

## 2021-05-09 DIAGNOSIS — I10 Essential (primary) hypertension: Secondary | ICD-10-CM

## 2021-05-09 DIAGNOSIS — R519 Headache, unspecified: Secondary | ICD-10-CM | POA: Insufficient documentation

## 2021-05-09 DIAGNOSIS — E7800 Pure hypercholesterolemia, unspecified: Secondary | ICD-10-CM

## 2021-05-09 HISTORY — DX: Type 2 diabetes mellitus with hyperglycemia: E11.65

## 2021-05-09 HISTORY — DX: Essential (primary) hypertension: I10

## 2021-05-09 HISTORY — DX: Pure hypercholesterolemia, unspecified: E78.00

## 2021-05-09 HISTORY — DX: Hodgkin lymphoma, unspecified, unspecified site: C81.90

## 2021-05-10 ENCOUNTER — Encounter: Payer: Self-pay | Admitting: Cardiology

## 2021-05-10 ENCOUNTER — Ambulatory Visit (INDEPENDENT_AMBULATORY_CARE_PROVIDER_SITE_OTHER): Payer: Commercial Managed Care - PPO | Admitting: Cardiology

## 2021-05-10 VITALS — BP 126/62 | HR 97 | Ht 74.0 in | Wt 220.8 lb

## 2021-05-10 DIAGNOSIS — I10 Essential (primary) hypertension: Secondary | ICD-10-CM

## 2021-05-10 DIAGNOSIS — E782 Mixed hyperlipidemia: Secondary | ICD-10-CM | POA: Diagnosis not present

## 2021-05-10 DIAGNOSIS — R079 Chest pain, unspecified: Secondary | ICD-10-CM

## 2021-05-10 DIAGNOSIS — E088 Diabetes mellitus due to underlying condition with unspecified complications: Secondary | ICD-10-CM

## 2021-05-10 HISTORY — DX: Chest pain, unspecified: R07.9

## 2021-05-10 HISTORY — DX: Diabetes mellitus due to underlying condition with unspecified complications: E08.8

## 2021-05-10 NOTE — Progress Notes (Signed)
?Cardiology Office Note:   ? ?Date:  05/10/2021  ? ?William Sheppard, DOB 23-Oct-1970, MRN [id number] ? ?PCP:  Biagio Borg, MD  ?Cardiologist:  Jenean Lindau, MD  ? ?Referring MD: Biagio Borg, MD  ? ? ?ASSESSMENT:   ? ?1. Essential hypertension   ?2. Mixed hyperlipidemia   ?3. Diabetes mellitus due to underlying condition with unspecified complications ([place])   ? ?PLAN:   ? ?In order of problems listed above: ? ?Primary prevention stressed with the patient.  Importance of compliance with diet medication stressed and vocalized understanding. ?Chest pain: Atypical in nature but in view of risk factors we will do an exercise stress Cardiolite and is agreeable. ?Essential hypertension: Blood pressure stable and diet was emphasized. ?Uncontrolled diabetes mellitus: I had a long chat with him about diet and compliance with medical therapy for diabetes mellitus.  Risks of uncontrolled diabetes explained and he vocalized understanding and questions were answered to his satisfaction. ?Mixed dyslipidemia: Stable at this time.  Lipids were reviewed and discussed with him.  Diet emphasized. ?Patient will be seen in follow-up appointment in 9 months or earlier if the patient has any concerns ? ? ? ?Medication Adjustments/Labs and Tests Ordered: ?Current medicines are reviewed at length with the patient today.  Concerns regarding medicines are outlined above.  ?No orders of the defined types were placed in this encounter. ? ?No orders of the defined types were placed in this encounter. ? ? ? ?History of Present Illness:   ? ?William Sheppard is a 51 y.o. male who is being seen today for the evaluation of chest pain at the request of Biagio Borg, MD. patient is a pleasant 51 year old male.  He has past medical history of essential hypertension, uncontrolled diabetes mellitus and mixed dyslipidemia.  He recently had diabetic ketoacidosis.  Subsequently he was referred to an endocrinologist and cardiologist also.  No orthopnea  or PND.  His chest discomfort is atypical and substernal burning in nature.  This is not related to exertion.  He has an active job but does not exercise on a regular basis.  Sexual activity does not bring around the symptoms.  At the time of my evaluation, the patient is alert awake oriented and in no distress. ? ?Past Medical History:  ?Diagnosis Date  ? AKI (acute kidney injury) ([place]) 02/04/2021  ? Chronic headaches   ? Cold intolerance 08/01/2020  ? Daytime somnolence 09/18/2012  ? Diabetes mellitus type II, non insulin dependent ([place]) 03/11/2015  ? Dizziness 07/21/2015  ? DKA (diabetic ketoacidosis) ([place]) 02/04/2021  ? Encounter for well adult exam with abnormal findings 03/08/2021  ? Erectile dysfunction 01/09/2020  ? Essential hypertension 05/09/2021  ? Hodgkin lymphoma, unspecified, unspecified site ([place]) 05/09/2021  ? Hodgkin's lymphoma ([place])   ? Hyperglycemia due to type 2 diabetes mellitus ([place]) 05/09/2021  ? Hyperlipidemia   ? Inadequate sleep hygiene 10/23/2012  ? Left hip pain 02/28/2016  ? Low back pain 03/08/2021  ? Low serum testosterone level 11/04/2013  ? Male hypogonadism 11/05/2013  ? Mid back pain on left side 05/31/2012  ? Neck pain on left side 11/08/2016  ? Numbness 11/03/2012  ? Pain in right hand 10/09/2017  ? Pain of right thumb 09/27/2017  ? Pure hypercholesterolemia 05/09/2021  ? Right flank pain 12/08/2018  ? Thalassemia, alpha ([place]) 03/11/2015  ? Vitamin D deficiency 01/09/2020  ? ? ?Past Surgical History:  ?Procedure Laterality Date  ? [place]  ?  WISDOM TOOTH EXTRACTION    ? ? ?Current Medications: ?Current Meds  ?Medication Sig  ? Continuous Blood Gluc Sensor (DEXCOM G6 SENSOR) MISC Use as directed for 10 days E11.9  ? Continuous Blood Gluc Transmit (DEXCOM G6 TRANSMITTER) MISC Use as directed 1 every 3 months E11.9  ? glucose blood test strip And lancets 1/day 250.00  ? insulin NPH Human (NOVOLIN N RELION) 100 UNIT/ML injection Inject 0.15 mLs (15 Units total) into the skin 2 (two)  times daily at 8 am and 10 pm.  ? insulin regular (NOVOLIN R RELION) 100 units/mL injection Inject 0.06 mLs (6 Units total) into the skin 3 (three) times daily before meals. Only give half (3U) with meals if eating small meal or hold if unable to eat  ? Insulin Syringes, Disposable, U-100 0.3 ML MISC Insulin syringes  ? lisinopril (ZESTRIL) 5 MG tablet Take 1 tablet (5 mg total) by mouth daily.  ? methocarbamol (ROBAXIN) 500 MG tablet Take 1 tablet (500 mg total) by mouth every 6 (six) hours as needed for muscle spasms.  ? pravastatin (PRAVACHOL) 40 MG tablet Take 1 tablet (40 mg total) by mouth daily.  ? tadalafil (CIALIS) 20 MG tablet Take 0.5-1 tablets (10-20 mg total) by mouth every other day as needed for erectile dysfunction.  ?  ? ?Allergies:   Dapagliflozin  ? ?Social History  ? ?Socioeconomic History  ? Marital status: Married  ?  Spouse name: Not on file  ? Number of children: Not on file  ? Years of education: masters  ? Highest education level: Not on file  ?Occupational History  ? Occupation: Chief Financial Officer  ?  Employer: [employer]  ?Tobacco Use  ? Smoking status: Never  ? Smokeless tobacco: Never  ?Vaping Use  ? Vaping Use: Never used  ?Substance and Sexual Activity  ? Alcohol use: Yes  ?  Comment: social  ? Drug use: No  ? Sexual activity: Not on file  ?Other Topics Concern  ? Not on file  ?Social History Narrative  ? Not on file  ? ?Social Determinants of Health  ? ?Financial Resource Strain: Not on file  ?Food Insecurity: Not on file  ?Transportation Needs: Not on file  ?Physical Activity: Not on file  ?Stress: Not on file  ?Social Connections: Not on file  ?  ? ?Family History: ?The patient's family history is not on file. He was adopted. ? ?ROS:   ?Please see the history of present illness.    ?All other systems reviewed and are negative. ? ?EKGs/Labs/Other Studies Reviewed:   ? ?The following studies were reviewed today: ?EKG reveals sinus rhythm and nonspecific ST changes ? ? ?Recent  Labs: ?02/05/2021: Magnesium 2.2 ?03/08/2021: ALT 28; BUN 12; Creatinine, Ser 1.10; Hemoglobin 12.3; Platelets 208.0; Potassium 4.3; Sodium 141; TSH 1.24  ?Recent Lipid Panel ?   ?Component Value Date/Time  ? CHOL 154 03/08/2021 0853  ? TRIG 96.0 03/08/2021 0853  ? HDL 49.80 03/08/2021 0853  ? CHOLHDL 3 03/08/2021 0853  ? VLDL 19.2 03/08/2021 0853  ? [place] 85 03/08/2021 0853  ? LDLDIRECT 82.0 08/26/2019 1637  ? ? ?Physical Exam:   ? ?VS:  BP 126/62   Pulse 97   Ht '6\' 2"'$  (1.88 m)   Wt 220 lb 12.8 oz (100.2 kg)   SpO2 96%   BMI 28.35 kg/m?    ? ?Wt Readings from Last 3 Encounters:  ?05/10/21 220 lb 12.8 oz (100.2 kg)  ?03/08/21 208 lb (94.3 kg)  ?02/10/21  207 lb 1.6 oz (93.9 kg)  ?  ? ?GEN: Patient is in no acute distress ?HEENT: Normal ?NECK: No JVD; No carotid bruits ?LYMPHATICS: No lymphadenopathy ?CARDIAC: S1 S2 regular, 2/6 systolic murmur at the apex. ?RESPIRATORY:  Clear to auscultation without rales, wheezing or rhonchi  ?ABDOMEN: Soft, non-tender, non-distended ?MUSCULOSKELETAL:  No edema; No deformity  ?SKIN: Warm and dry ?NEUROLOGIC:  Alert and oriented x 3 ?PSYCHIATRIC:  Normal affect  ? ? ?Signed, ?Jenean Lindau, MD  ?05/10/2021 3:59 PM    ?[place]   ?

## 2021-05-10 NOTE — Patient Instructions (Signed)
Medication Instructions:  Your physician recommends that you continue on your current medications as directed. Please refer to the Current Medication list given to you today.  *If you need a refill on your cardiac medications before your next appointment, please call your pharmacy*   Lab Work: None ordered If you have labs (blood work) drawn today and your tests are completely normal, you will receive your results only by: MyChart Message (if you have MyChart) OR A paper copy in the mail If you have any lab test that is abnormal or we need to change your treatment, we will call you to review the results.   Testing/Procedures: Your physician has requested that you have a lexiscan myoview. For further information please visit [email or web address]. Please follow instruction sheet, as given.  The test will take approximately 3 to 4 hours to complete; you may bring reading material.  If someone comes with you to your appointment, they will need to remain in the main lobby due to limited space in the testing area. **If you are pregnant or breastfeeding, please notify the nuclear lab prior to your appointment**  How to prepare for your Myocardial Perfusion Test: Do not eat or drink 3 hours prior to your test, except you may have water. Do not consume products containing caffeine (regular or decaffeinated) 12 hours prior to your test. (ex: coffee, chocolate, sodas, tea). Do bring a list of your current medications with you.  If not listed below, you may take your medications as normal. Do wear comfortable clothes (no dresses or overalls) and walking shoes, tennis shoes preferred (No heels or open toe shoes are allowed). Do NOT wear cologne, perfume, aftershave, or lotions (deodorant is allowed). If these instructions are not followed, your test will have to be rescheduled.    Follow-Up: At [facility], you and your health needs are our priority.  As part of our continuing mission to provide  you with exceptional heart care, we have created designated Provider Care Teams.  These Care Teams include your primary Cardiologist (physician) and Advanced Practice Providers (APPs -  Physician Assistants and Nurse Practitioners) who all work together to provide you with the care you need, when you need it.  We recommend signing up for the patient portal called "MyChart".  Sign up information is provided on this After Visit Summary.  MyChart is used to connect with patients for Virtual Visits (Telemedicine).  Patients are able to view lab/test results, encounter notes, upcoming appointments, etc.  Non-urgent messages can be sent to your provider as well.   To learn more about what you can do with MyChart, go to [email or web address].    Your next appointment:   9 month(s)  The format for your next appointment:   In Person  Provider:   Rajan Revankar, MD   Other Instructions Cardiac Nuclear Scan A cardiac nuclear scan is a test that is done to check the flow of blood to your heart. It is done when you are resting and when you are exercising. The test looks for problems such as: Not enough blood reaching a portion of the heart. The heart muscle not working as it should. You may need this test if: You have heart disease. You have had lab results that are not normal. You have had heart surgery or a balloon procedure to open up blocked arteries (angioplasty). You have chest pain. You have shortness of breath. In this test, a special dye (tracer) is put into your bloodstream. The tracer will travel   to your heart. A camera will then take pictures of your heart to see how the tracer moves through your heart. This test is usually done at a hospital and takes 2-4 hours. Tell a doctor about: Any allergies you have. All medicines you are taking, including vitamins, herbs, eye drops, creams, and over-the-counter medicines. Any problems you or family members have had with anesthetic  medicines. Any blood disorders you have. Any surgeries you have had. Any medical conditions you have. Whether you are pregnant or may be pregnant. What are the risks? Generally, this is a safe test. However, problems may occur, such as: Serious chest pain and heart attack. This is only a risk if the stress portion of the test is done. Rapid heartbeat. A feeling of warmth in your chest. This feeling usually does not last long. Allergic reaction to the tracer. What happens before the test? Ask your doctor about changing or stopping your normal medicines. This is important. Follow instructions from your doctor about what you cannot eat or drink. Remove your jewelry on the day of the test. What happens during the test? An IV tube will be inserted into one of your veins. Your doctor will give you a small amount of tracer through the IV tube. You will wait for 20-40 minutes while the tracer moves through your bloodstream. Your heart will be monitored with an electrocardiogram (ECG). You will lie down on an exam table. Pictures of your heart will be taken for about 15-20 minutes. You may also have a stress test. For this test, one of these things may be done: You will be asked to exercise on a treadmill or a stationary bike. You will be given medicines that will make your heart work harder. This is done if you are unable to exercise. When blood flow to your heart has peaked, a tracer will again be given through the IV tube. After 20-40 minutes, you will get back on the exam table. More pictures will be taken of your heart. Depending on the tracer that is used, more pictures may need to be taken 3-4 hours later. Your IV tube will be removed when the test is over. The test may vary among doctors and hospitals. What happens after the test? Ask your doctor: Whether you can return to your normal schedule, including diet, activities, and medicines. Whether you should drink more fluids. This will  help to remove the tracer from your body. Drink enough fluid to keep your pee (urine) pale yellow. Ask your doctor, or the department that is doing the test: When will my results be ready? How will I get my results? Summary A cardiac nuclear scan is a test that is done to check the flow of blood to your heart. Tell your doctor whether you are pregnant or may be pregnant. Before the test, ask your doctor about changing or stopping your normal medicines. This is important. Ask your doctor whether you can return to your normal activities. You may be asked to drink more fluids. This information is not intended to replace advice given to you by your health care provider. Make sure you discuss any questions you have with your health care provider. Document Revised: 05/21/2018 Document Reviewed: 07/15/2017 Elsevier Patient Education  2021 Elsevier Inc.    

## 2021-05-22 ENCOUNTER — Encounter: Payer: Self-pay | Admitting: Internal Medicine

## 2021-05-22 MED ORDER — ALPRAZOLAM 0.5 MG PO TABS: 0.5000 mg | ORAL_TABLET | Freq: Two times a day (BID) | ORAL | 0 refills | Status: DC | PRN

## 2021-05-26 ENCOUNTER — Other Ambulatory Visit: Payer: Self-pay | Admitting: Internal Medicine

## 2021-05-30 ENCOUNTER — Telehealth (HOSPITAL_COMMUNITY): Payer: Self-pay | Admitting: *Deleted

## 2021-05-30 NOTE — Telephone Encounter (Signed)
Patient given detailed instructions per Myocardial Perfusion Study Information Sheet for the test on 06/06/21 Patient notified to arrive 15 minutes early and that it is imperative to arrive on time for appointment to keep from having the test rescheduled. ? If you need to cancel or reschedule your appointment, please call the office within 24 hours of your appointment. . Patient verbalized understanding. William Sheppard ? ? ?

## 2021-06-01 ENCOUNTER — Ambulatory Visit (INDEPENDENT_AMBULATORY_CARE_PROVIDER_SITE_OTHER): Payer: Commercial Managed Care - PPO

## 2021-06-01 DIAGNOSIS — R079 Chest pain, unspecified: Secondary | ICD-10-CM

## 2021-06-01 MED ORDER — TECHNETIUM TC 99M TETROFOSMIN IV KIT
9.8000 | PACK | Freq: Once | INTRAVENOUS | Status: AC | PRN
  Administered 2021-06-01: 9.8 via INTRAVENOUS

## 2021-06-01 MED ORDER — TECHNETIUM TC 99M TETROFOSMIN IV KIT
28.9000 | PACK | Freq: Once | INTRAVENOUS | Status: AC | PRN
Start: 2021-06-01 — End: 2021-06-01
  Administered 2021-06-01: 28.9 via INTRAVENOUS

## 2021-06-02 ENCOUNTER — Telehealth: Payer: Self-pay

## 2021-06-02 LAB — MYOCARDIAL PERFUSION IMAGING
Angina Index: 0
Duke Treadmill Score: 10
Estimated workload: 12.1
Exercise duration (min): 10 min
Exercise duration (sec): 15 s
LV dias vol: 143 mL (ref 62–150)
LV sys vol: 75 mL
MPHR: 171 {beats}/min
Nuc Stress EF: 48 %
Peak HR: 155 {beats}/min
Percent HR: 90 %
RPE: 18
Rest HR: 77 {beats}/min
Rest Nuclear Isotope Dose: 9.8 mCi
SDS: 0
SRS: 0
SSS: 0
ST Depression (mm): 0 mm
Stress Nuclear Isotope Dose: 28.9 mCi
TID: 0.97

## 2021-06-02 NOTE — Telephone Encounter (Signed)
Patient notified of results and result forward to PCP ?

## 2021-06-02 NOTE — Telephone Encounter (Signed)
-----   Message from Jenean Lindau, MD sent at 06/02/2021  2:44 PM EDT ----- ?I would not be worried about ejection fraction because echo done in December or late last year was within normal limits.  The results of the study is unremarkable. Please inform patient. I will discuss in detail at next appointment. Cc  primary care/referring physician ?Jenean Lindau, MD 06/02/2021 2:44 PM  ?

## 2021-06-06 ENCOUNTER — Encounter: Payer: Self-pay | Admitting: Internal Medicine

## 2021-06-06 MED ORDER — TADALAFIL 20 MG PO TABS: 10.0000 mg | ORAL_TABLET | ORAL | 3 refills | Status: DC | PRN

## 2021-06-06 MED ORDER — DEXCOM G6 SENSOR MISC: 3 refills | Status: DC

## 2021-06-06 MED ORDER — DEXCOM G6 TRANSMITTER MISC: 3 refills | Status: DC

## 2021-06-12 LAB — HM DIABETES EYE EXAM

## 2021-06-14 ENCOUNTER — Encounter: Payer: Self-pay | Admitting: Gastroenterology

## 2021-06-27 MED ORDER — DEXCOM G6 SENSOR MISC: 3 refills | Status: DC

## 2021-06-27 NOTE — Addendum Note (Signed)
Addended by: Terence Lux A on: 06/27/2021 10:01 AM ? ? Modules accepted: Orders ? ?

## 2021-06-29 ENCOUNTER — Ambulatory Visit (AMBULATORY_SURGERY_CENTER): Payer: Commercial Managed Care - PPO | Admitting: *Deleted

## 2021-06-29 VITALS — Ht 74.0 in | Wt 217.0 lb

## 2021-06-29 DIAGNOSIS — Z1211 Encounter for screening for malignant neoplasm of colon: Secondary | ICD-10-CM

## 2021-06-29 MED ORDER — NA SULFATE-K SULFATE-MG SULF 17.5-3.13-1.6 GM/177ML PO SOLN: 2.0000 | Freq: Once | ORAL | 0 refills | Status: AC

## 2021-06-29 NOTE — Progress Notes (Signed)
No egg or soy allergy known to patient  No issues known to pt with past sedation with any surgeries or procedures Patient denies ever being told they had issues or difficulty with intubation  No FH of Malignant Hyperthermia Pt is not on diet pills Pt is not on  home 02  Pt is not on blood thinners  Pt denies issues with constipation  No A fib or A flutter   PV completed over the phone. Pt verified name, DOB, address and insurance during PV today.   Pt encouraged to call with questions or issues.  If pt has My chart, procedure instructions sent via My Chart  Insurance confirmed with pt at [facility] today

## 2021-07-21 ENCOUNTER — Encounter: Payer: Self-pay | Admitting: Gastroenterology

## 2021-07-23 IMAGING — CT CT RENAL STONE PROTOCOL
2 of 4 series · 15 of 46 positions shown, 17 images · non-contrast
Comparison: CT abdomen pelvis 12/01/1998 report without imaging

CLINICAL DATA: Flank pain, kidney stone suspected. Left flank pain.
Microscopic hematuria. History of lymphoma.

EXAM:
CT ABDOMEN AND PELVIS WITHOUT CONTRAST
TECHNIQUE: Multidetector CT imaging of the abdomen and pelvis was performed
following the standard protocol without IV contrast.

[Series 2: stone study 5.0 br38 1 · axial · 0.78mm/px · z∈[-380,+76]mm · 12 of 101 slices shown, 14 images]
[im 5/101  soft-tissue]
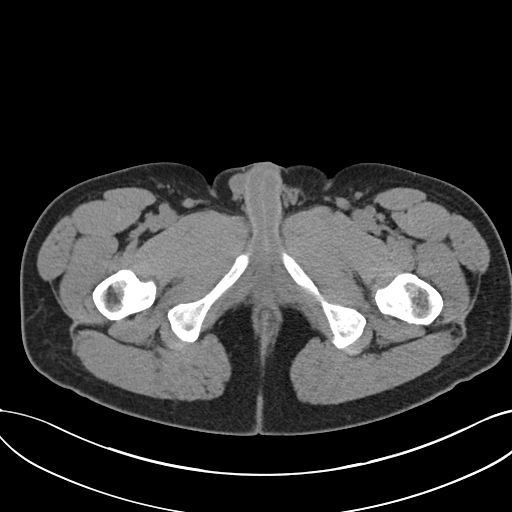
[im 5/101  bone]
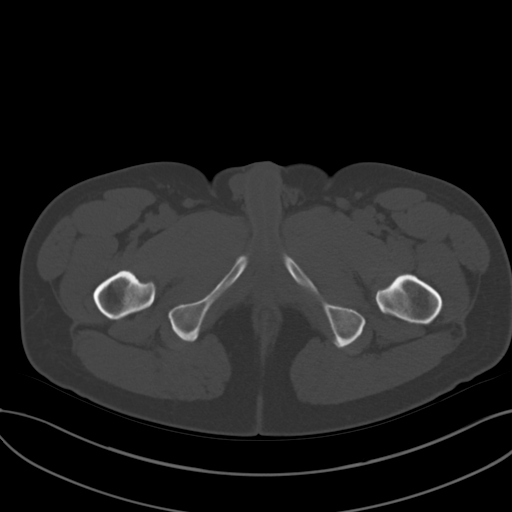
[im 13/101  soft-tissue]
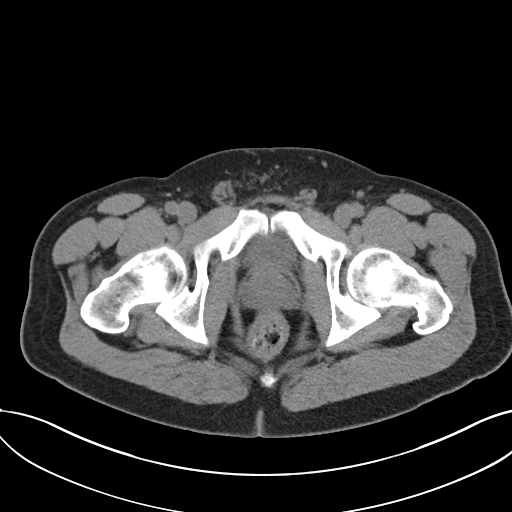
[im 21/101  soft-tissue]
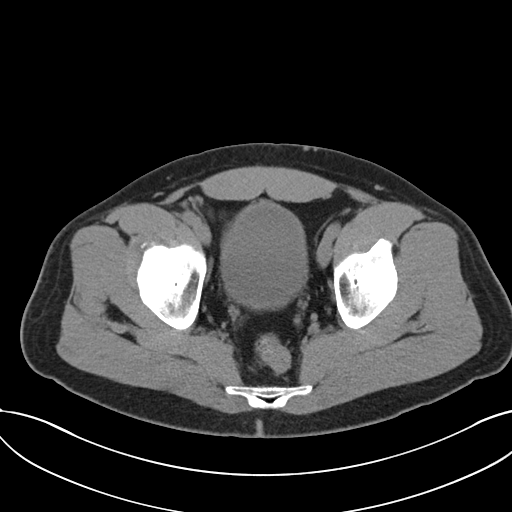
[im 30/101  soft-tissue]
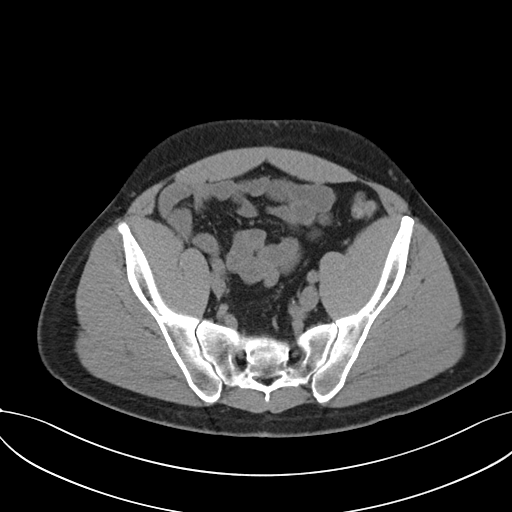
[im 38/101  soft-tissue]
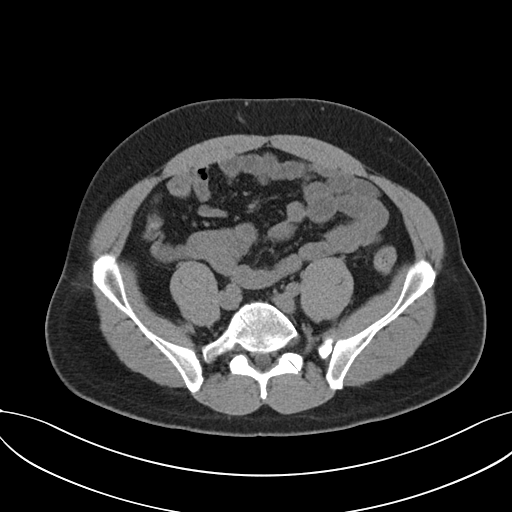
[im 46/101  soft-tissue]
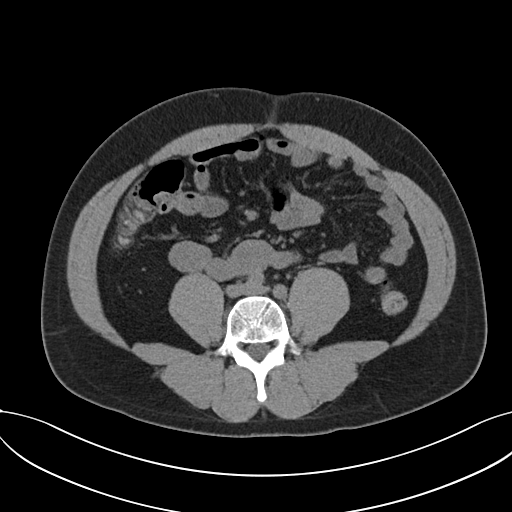
[im 55/101  soft-tissue]
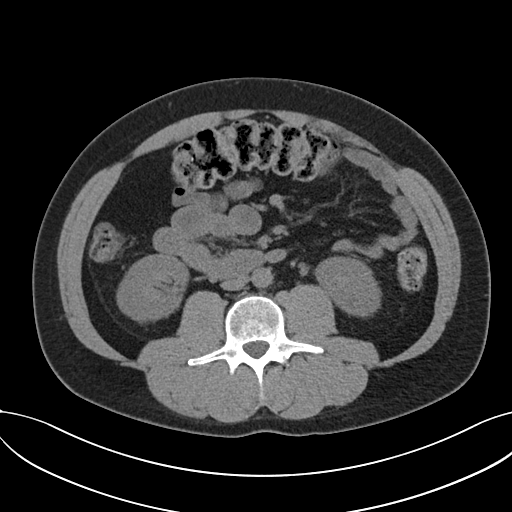
[im 63/101  soft-tissue]
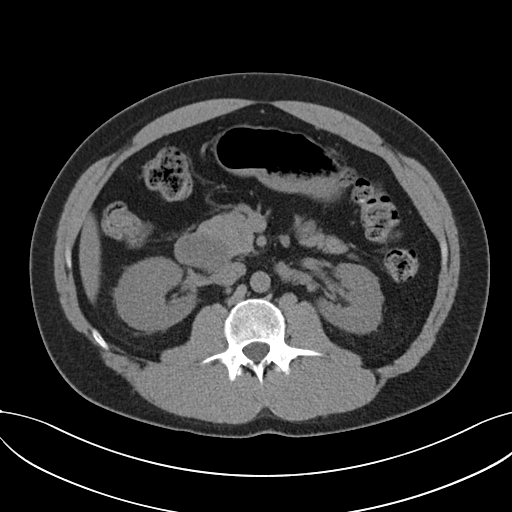
[im 71/101  soft-tissue]
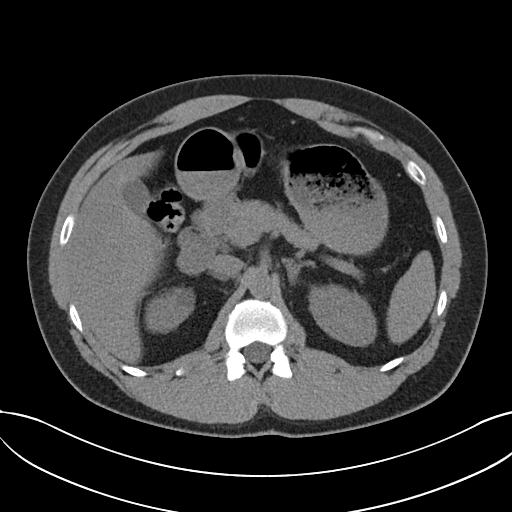
[im 71/101  bone]
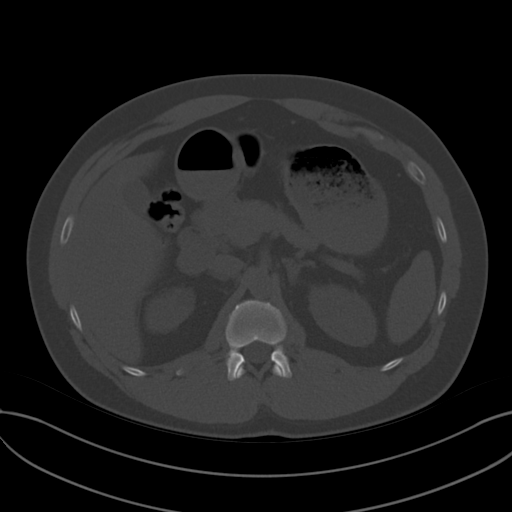
[im 80/101  soft-tissue]
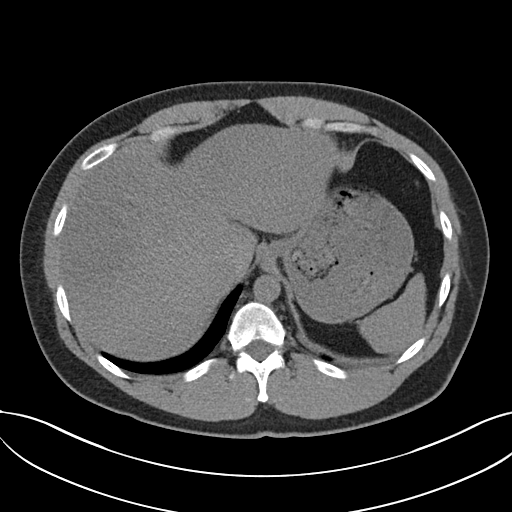
[im 88/101  soft-tissue]
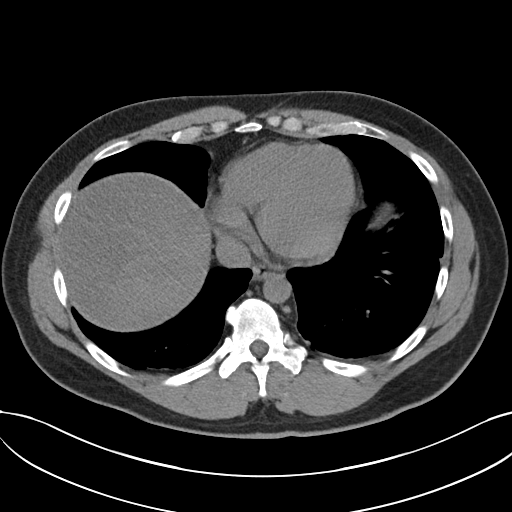
[im 96/101  soft-tissue]
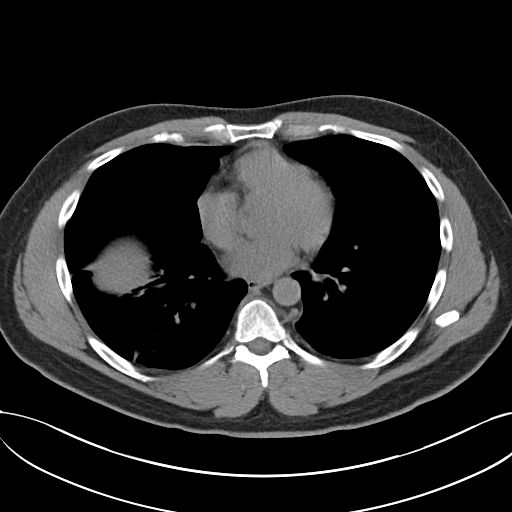

[Series 5: coronal soft tissue · coronal · 0.74mm/px · 3 of 94 slices shown]
[im 32/94  soft-tissue]
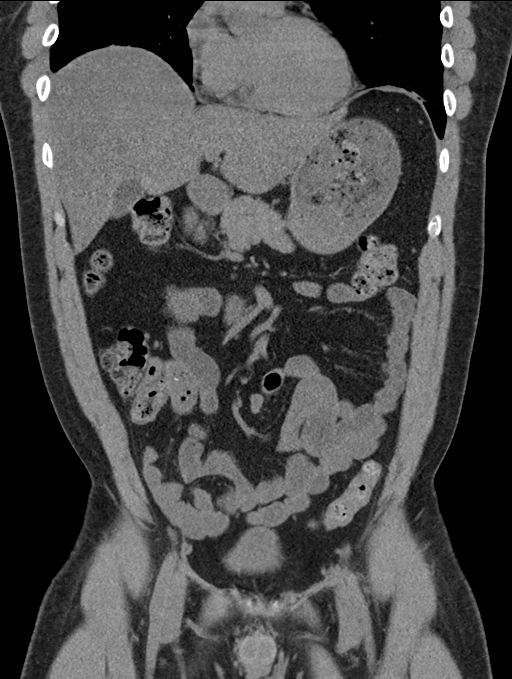
[im 42/94  soft-tissue]
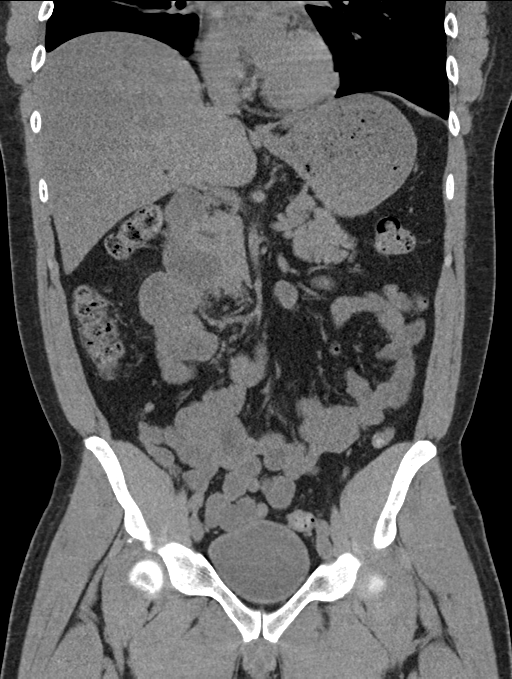
[im 52/94  soft-tissue]
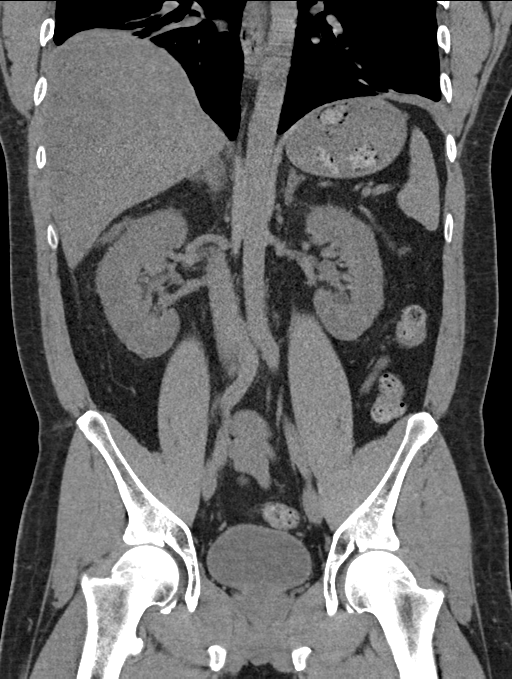

[15 of 46 positions shown; findings below may reference images not displayed]

FINDINGS: Lower chest: Bilateral lower lobe linear atelectasis versus
scarring.

Hepatobiliary: Question heterogeneous appearance of the hepatic
parenchyma which could represent geometric fatty infiltration versus
artifact versus mass lesion with markedly limited evaluation on this
noncontrast study. No focal liver abnormality. No gallstones,
gallbladder wall thickening, or pericholecystic fluid. No biliary
dilatation.

Pancreas: No focal lesion. Normal pancreatic contour. No surrounding
inflammatory changes. No main pancreatic ductal dilatation.

Spleen: Normal in size without focal abnormality.

Adrenals/Urinary Tract: No adrenal nodule bilaterally. There is a
1.8 cm fluid density lesion within the right kidney likely
represents a simple renal cyst. No nephrolithiasis, no
hydronephrosis, and no contour-deforming renal mass. No
ureterolithiasis or hydroureter. The urinary bladder is
unremarkable.

Stomach/Bowel: Stomach is within normal limits. No evidence of bowel
wall thickening or dilatation. Appendix appears normal.

Vascular/Lymphatic: No abdominal aorta or iliac aneurysm. Mild
atherosclerotic plaque. No abdominal, pelvic, or inguinal
lymphadenopathy.

Reproductive: Prostate is unremarkable.

Other: No intraperitoneal free fluid. No intraperitoneal free gas.
No organized fluid collection.

Musculoskeletal:

No abdominal wall hernia or abnormality.

No suspicious lytic or blastic osseous lesions. No acute displaced
fracture. Multilevel degenerative changes of the spine.
IMPRESSION: 1. Question heterogeneous appearance of the hepatic parenchyma which
could represent geometric fatty infiltration versus artifact versus
mass lesion with markedly limited evaluation on this noncontrast
study. Correlate with liver function tests; if then clinically
indicated consider MRI liver protocol for further evaluation.
2. Otherwise no acute intra-abdominal or intrapelvic abnormality on
this noncontrast study.

## 2021-07-26 ENCOUNTER — Ambulatory Visit (AMBULATORY_SURGERY_CENTER): Payer: Commercial Managed Care - PPO | Admitting: Gastroenterology

## 2021-07-26 ENCOUNTER — Encounter: Payer: Self-pay | Admitting: Gastroenterology

## 2021-07-26 VITALS — BP 116/76 | HR 82 | Temp 98.0°F | Resp 11 | Ht 74.0 in | Wt 217.0 lb

## 2021-07-26 DIAGNOSIS — Z1211 Encounter for screening for malignant neoplasm of colon: Secondary | ICD-10-CM | POA: Diagnosis not present

## 2021-07-26 MED ORDER — SODIUM CHLORIDE 0.9 % IV SOLN: 500.0000 mL | Freq: Once | INTRAVENOUS | Status: DC

## 2021-07-26 NOTE — Op Note (Signed)
[facility] Patient Name: William Sheppard Procedure Date: 07/26/2021 11:11 AM MRN: [id number] Endoscopist: Justice Britain , MD Age: 51 Referring MD:  Date of Birth: [birth year] Gender: Male Account #: [id number] Procedure:                Colonoscopy Indications:              Screening for colorectal malignant neoplasm Medicines:                Monitored Anesthesia Care Procedure:                Pre-Anesthesia Assessment:                           - Prior to the procedure, a History and Physical                            was performed, and patient medications and                            allergies were reviewed. The patient's tolerance of                            previous anesthesia was also reviewed. The risks                            and benefits of the procedure and the sedation                            options and risks were discussed with the patient.                            All questions were answered, and informed consent                            was obtained. Prior Anticoagulants: The patient has                            taken no previous anticoagulant or antiplatelet                            agents except for aspirin. ASA Grade Assessment: II                            - A patient with mild systemic disease. After                            reviewing the risks and benefits, the patient was                            deemed in satisfactory condition to undergo the                            procedure.  After obtaining informed consent, the colonoscope                            was passed under direct vision. Throughout the                            procedure, the patient's blood pressure, pulse, and                            oxygen saturations were monitored continuously. The                            CF [id number] #[id number] was introduced through the anus                            and advanced to the 5 cm into the ileum. The                             colonoscopy was performed without difficulty. The                            patient tolerated the procedure. The quality of the                            bowel preparation was good. The terminal ileum,                            ileocecal valve, appendiceal orifice, and rectum                            were photographed. Scope In: 11:15:46 AM Scope Out: 11:28:46 AM Scope Withdrawal Time: 0 hours 7 minutes 49 seconds  Total Procedure Duration: 0 hours 13 minutes 0 seconds  Findings:                 The digital rectal exam findings include                            hemorrhoids. Pertinent negatives include no                            palpable rectal lesions.                           The colon (entire examined portion) was                            significantly tortuous.                           The terminal ileum and ileocecal valve appeared                            normal.  A few small-mouthed diverticula were found in the                            ascending colon.                           Normal mucosa was found in the entire colon.                           Non-bleeding non-thrombosed internal hemorrhoids                            were found during retroflexion, during perianal                            exam and during digital exam. The hemorrhoids were                            Grade II (internal hemorrhoids that prolapse but                            reduce spontaneously). Complications:            No immediate complications. Estimated Blood Loss:     Estimated blood loss: none. Impression:               - Hemorrhoids found on digital rectal exam.                           - Tortuous colon.                           - The examined portion of the ileum was normal.                           - Diverticulosis in the ascending colon.                           - Normal mucosa in the entire examined colon.                            - Non-bleeding non-thrombosed internal hemorrhoids. Recommendation:           - The patient will be observed post-procedure,                            until all discharge criteria are met.                           - Discharge patient to home.                           - Patient has a contact number available for                            emergencies. The signs and symptoms of potential  delayed complications were discussed with the                            patient. Return to normal activities tomorrow.                            Written discharge instructions were provided to the                            patient.                           - High fiber diet.                           - Use FiberCon 1-2 tablets PO daily.                           - Continue present medications.                           - Repeat colonoscopy in 10 years for screening                            purposes.                           - The findings and recommendations were discussed                            with the patient.                           - The findings and recommendations were discussed                            with the patient's family. Justice Britain, MD 07/26/2021 11:32:38 AM

## 2021-07-26 NOTE — Patient Instructions (Signed)
Handouts on hemorrhoids and diverticulosis given.  YOU HAD AN ENDOSCOPIC PROCEDURE TODAY AT [facility]:   Refer to the procedure report that was given to you for any specific questions about what was found during the examination.  If the procedure report does not answer your questions, please call your gastroenterologist to clarify.  If you requested that your care partner not be given the details of your procedure findings, then the procedure report has been included in a sealed envelope for you to review at your convenience later.  YOU SHOULD EXPECT: Some feelings of bloating in the abdomen. Passage of more gas than usual.  Walking can help get rid of the air that was put into your GI tract during the procedure and reduce the bloating. If you had a lower endoscopy (such as a colonoscopy or flexible sigmoidoscopy) you may notice spotting of blood in your stool or on the toilet paper. If you underwent a bowel prep for your procedure, you may not have a normal bowel movement for a few days.  Please Note:  You might notice some irritation and congestion in your nose or some drainage.  This is from the oxygen used during your procedure.  There is no need for concern and it should clear up in a day or so.  SYMPTOMS TO REPORT IMMEDIATELY:  Following lower endoscopy (colonoscopy or flexible sigmoidoscopy):  Excessive amounts of blood in the stool  Significant tenderness or worsening of abdominal pains  Swelling of the abdomen that is new, acute  Fever of 100F or higher  For urgent or emergent issues, a gastroenterologist can be reached at any hour by calling [phone]. Do not use MyChart messaging for urgent concerns.    DIET:  We do recommend a small meal at first, but then you may proceed to your regular diet.  Drink plenty of fluids but you should avoid alcoholic beverages for 24 hours.  ACTIVITY:  You should plan to take it easy for the rest of today and you should NOT  DRIVE or use heavy machinery until tomorrow (because of the sedation medicines used during the test).    FOLLOW UP: Our staff will call the number listed on your records 24-72 hours following your procedure to check on you and address any questions or concerns that you may have regarding the information given to you following your procedure. If we do not reach you, we will leave a message.  We will attempt to reach you two times.  During this call, we will ask if you have developed any symptoms of COVID 19. If you develop any symptoms (ie: fever, flu-like symptoms, shortness of breath, cough etc.) before then, please call [phone].  If you test positive for Covid 19 in the 2 weeks post procedure, please call and report this information to [place].    If any biopsies were taken you will be contacted by phone or by letter within the next 1-3 weeks.  Please call us at [phone] if you have not heard about the biopsies in 3 weeks.    SIGNATURES/CONFIDENTIALITY: You and/or your care partner have signed paperwork which will be entered into your electronic medical record.  These signatures attest to the fact that that the information above on your After Visit Summary has been reviewed and is understood.  Full responsibility of the confidentiality of this discharge information lies with you and/or your care-partner.

## 2021-07-26 NOTE — Progress Notes (Signed)
Pt's states no medical or surgical changes since previsit or office visit. 

## 2021-07-26 NOTE — Progress Notes (Signed)
GASTROENTEROLOGY PROCEDURE H&P NOTE   Primary Care Physician: Biagio Borg, MD  HPI: William Sheppard is a 51 y.o. male who presents for Colonoscopy for screening.  Past Medical History:  Diagnosis Date   AKI (acute kidney injury) ([place]) 02/04/2021   Chronic headaches    Cold intolerance 08/01/2020   Daytime somnolence 09/18/2012   Diabetes mellitus type II, non insulin dependent ([place]) 03/11/2015   Dizziness 07/21/2015   DKA (diabetic ketoacidosis) ([place]) 02/04/2021   Encounter for well adult exam with abnormal findings 03/08/2021   Erectile dysfunction 01/09/2020   Essential hypertension 05/09/2021   Hodgkin lymphoma, unspecified, unspecified site ([place]) 05/09/2021   Hodgkin's lymphoma ([place])    Hyperglycemia due to type 2 diabetes mellitus ([place]) 05/09/2021   Hyperlipidemia    Inadequate sleep hygiene 10/23/2012   Left hip pain 02/28/2016   Low back pain 03/08/2021   Low serum testosterone level 11/04/2013   Male hypogonadism 11/05/2013   Mid back pain on left side 05/31/2012   Neck pain on left side 11/08/2016   Numbness 11/03/2012   Pain in right hand 10/09/2017   Pain of right thumb 09/27/2017   Pure hypercholesterolemia 05/09/2021   Right flank pain 12/08/2018   Thalassemia, alpha ([place]) 03/11/2015   Vitamin D deficiency 01/09/2020   Past Surgical History:  Procedure Laterality Date   HERNIA REPAIR     as a child   PORTACATH PLACEMENT  02/12/1997   WISDOM TOOTH EXTRACTION     Current Outpatient Medications  Medication Sig Dispense Refill   ALPRAZolam (XANAX) 0.5 MG tablet Take 1 tablet (0.5 mg total) by mouth 2 (two) times daily as needed for anxiety. (Patient not taking: Reported on 06/29/2021) 20 tablet 0   Continuous Blood Gluc Sensor (DEXCOM G6 SENSOR) MISC Use as directed for 10 days E11.9 9 each 3   Continuous Blood Gluc Transmit (DEXCOM G6 TRANSMITTER) MISC Use as directed 1 every 3 months E11.9 1 each 3   glucose blood test strip And lancets 1/day 250.00 100 each 12   insulin  lispro (HUMALOG) 100 UNIT/ML injection 6units     insulin NPH Human (NOVOLIN N RELION) 100 UNIT/ML injection Inject 0.15 mLs (15 Units total) into the skin 2 (two) times daily at 8 am and 10 pm. 9 mL 3   insulin NPH Human (NOVOLIN N) 100 UNIT/ML injection 15u     insulin regular (NOVOLIN R RELION) 100 units/mL injection Inject 0.06 mLs (6 Units total) into the skin 3 (three) times daily before meals. Only give half (3U) with meals if eating small meal or hold if unable to eat 5.4 mL 3   Insulin Syringes, Disposable, U-100 0.3 ML MISC Insulin syringes 100 each 0   lisinopril (ZESTRIL) 5 MG tablet Take 1 tablet (5 mg total) by mouth daily. 90 tablet 3   methocarbamol (ROBAXIN) 500 MG tablet Take 1 tablet (500 mg total) by mouth every 6 (six) hours as needed for muscle spasms. (Patient not taking: Reported on 06/29/2021) 60 tablet 2   pravastatin (PRAVACHOL) 40 MG tablet Take 1 tablet (40 mg total) by mouth daily. 90 tablet 3   tadalafil (CIALIS) 20 MG tablet Take 0.5-1 tablets (10-20 mg total) by mouth every other day as needed for erectile dysfunction. 5 tablet 3   No current facility-administered medications for this visit.    Current Outpatient Medications:    ALPRAZolam (XANAX) 0.5 MG tablet, Take 1 tablet (0.5 mg total) by mouth 2 (two) times daily as needed  for anxiety. (Patient not taking: Reported on 06/29/2021), Disp: 20 tablet, Rfl: 0   Continuous Blood Gluc Sensor (DEXCOM G6 SENSOR) MISC, Use as directed for 10 days E11.9, Disp: 9 each, Rfl: 3   Continuous Blood Gluc Transmit (DEXCOM G6 TRANSMITTER) MISC, Use as directed 1 every 3 months E11.9, Disp: 1 each, Rfl: 3   glucose blood test strip, And lancets 1/day 250.00, Disp: 100 each, Rfl: 12   insulin lispro (HUMALOG) 100 UNIT/ML injection, 6units, Disp: , Rfl:    insulin NPH Human (NOVOLIN N RELION) 100 UNIT/ML injection, Inject 0.15 mLs (15 Units total) into the skin 2 (two) times daily at 8 am and 10 pm., Disp: 9 mL, Rfl: 3   insulin  NPH Human (NOVOLIN N) 100 UNIT/ML injection, 15u, Disp: , Rfl:    insulin regular (NOVOLIN R RELION) 100 units/mL injection, Inject 0.06 mLs (6 Units total) into the skin 3 (three) times daily before meals. Only give half (3U) with meals if eating small meal or hold if unable to eat, Disp: 5.4 mL, Rfl: 3   Insulin Syringes, Disposable, U-100 0.3 ML MISC, Insulin syringes, Disp: 100 each, Rfl: 0   lisinopril (ZESTRIL) 5 MG tablet, Take 1 tablet (5 mg total) by mouth daily., Disp: 90 tablet, Rfl: 3   methocarbamol (ROBAXIN) 500 MG tablet, Take 1 tablet (500 mg total) by mouth every 6 (six) hours as needed for muscle spasms. (Patient not taking: Reported on 06/29/2021), Disp: 60 tablet, Rfl: 2   pravastatin (PRAVACHOL) 40 MG tablet, Take 1 tablet (40 mg total) by mouth daily., Disp: 90 tablet, Rfl: 3   tadalafil (CIALIS) 20 MG tablet, Take 0.5-1 tablets (10-20 mg total) by mouth every other day as needed for erectile dysfunction., Disp: 5 tablet, Rfl: 3 Allergies  Allergen Reactions   Dapagliflozin     Other reaction(s): dehydration   Family History  Adopted: Yes  Problem Relation Age of Onset   Colon cancer Neg Hx    Colon polyps Neg Hx    Esophageal cancer Neg Hx    Stomach cancer Neg Hx    Rectal cancer Neg Hx    Social History   Socioeconomic History   Marital status: Married    Spouse name: Not on file   Number of children: Not on file   Years of education: masters   Highest education level: Not on file  Occupational History   Occupation: Lobbyist: [employer]  Tobacco Use   Smoking status: Never   Smokeless tobacco: Never  Vaping Use   Vaping Use: Never used  Substance and Sexual Activity   Alcohol use: Yes    Comment: social   Drug use: No   Sexual activity: Not on file  Other Topics Concern   Not on file  Social History Narrative   Not on file   Social Determinants of Health   Financial Resource Strain: Not on file  Food Insecurity: Not on file   Transportation Needs: Not on file  Physical Activity: Not on file  Stress: Not on file  Social Connections: Not on file  Intimate Partner Violence: Not on file    Physical Exam: There were no vitals filed for this visit. There is no height or weight on file to calculate BMI. GEN: NAD EYE: Sclerae anicteric ENT: MMM CV: Non-tachycardic GI: Soft, NT/ND NEURO:  Alert & Oriented x 3  Lab Results: No results for input(s): "WBC", "HGB", "HCT", "PLT" in the last 72 hours. BMET  No results for input(s): "NA", "K", "CL", "CO2", "GLUCOSE", "BUN", "CREATININE", "CALCIUM" in the last 72 hours. LFT No results for input(s): "PROT", "ALBUMIN", "AST", "ALT", "ALKPHOS", "BILITOT", "BILIDIR", "IBILI" in the last 72 hours. PT/INR No results for input(s): "LABPROT", "INR" in the last 72 hours.   Impression / Plan: This is a 51 y.o.male who presents for Colonoscopy for screening.  The risks and benefits of endoscopic evaluation/treatment were discussed with the patient and/or family; these include but are not limited to the risk of perforation, infection, bleeding, missed lesions, lack of diagnosis, severe illness requiring hospitalization, as well as anesthesia and sedation related illnesses.  The patient's history has been reviewed, patient examined, no change in status, and deemed stable for procedure.  The patient and/or family is agreeable to proceed.    Justice Britain, MD [facility] Office # [phone]

## 2021-07-27 ENCOUNTER — Telehealth: Payer: Self-pay | Admitting: *Deleted

## 2021-07-27 ENCOUNTER — Telehealth: Payer: Self-pay

## 2021-07-27 NOTE — Telephone Encounter (Signed)
Left message on follow up call. 

## 2021-07-27 NOTE — Telephone Encounter (Signed)
No answer on second attempt follow up call.

## 2021-08-13 IMAGING — US US ABDOMEN LIMITED RUQ/ASCITES
1 series · 14 of 25 positions shown · non-contrast
Comparison: Noncontrast abdominal CT 05/02/2020

CLINICAL DATA: Abnormal CT imaging

Fatty liver versus mass
EXAM:
ULTRASOUND ABDOMEN LIMITED RIGHT UPPER QUADRANT

[Series 1: us abdomen limited ruq/ascites · 0.19mm/px · 14 of 59 slices shown]
[im 1/59]
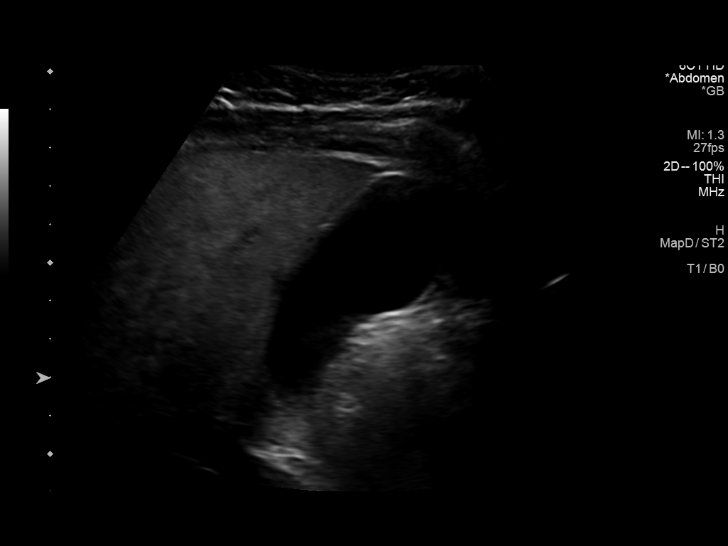
[im 5/59]
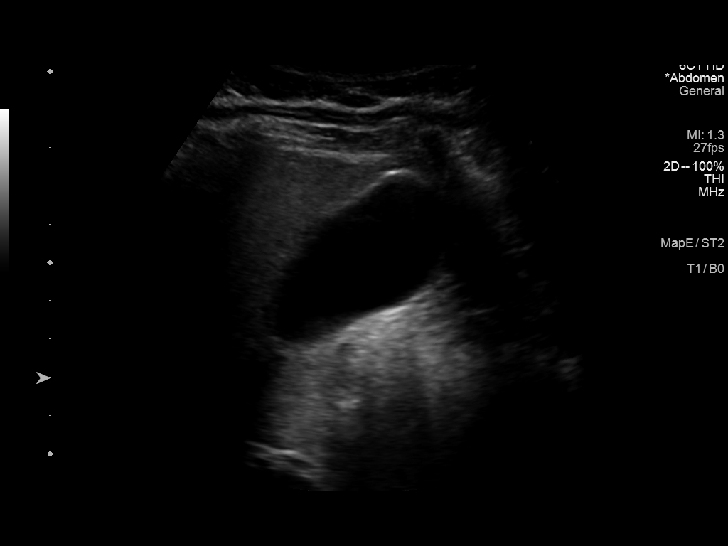
[im 10/59]
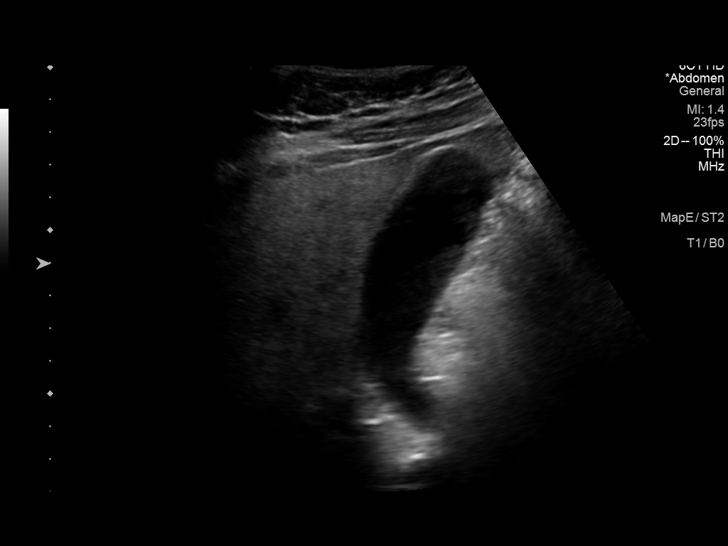
[im 15/59]
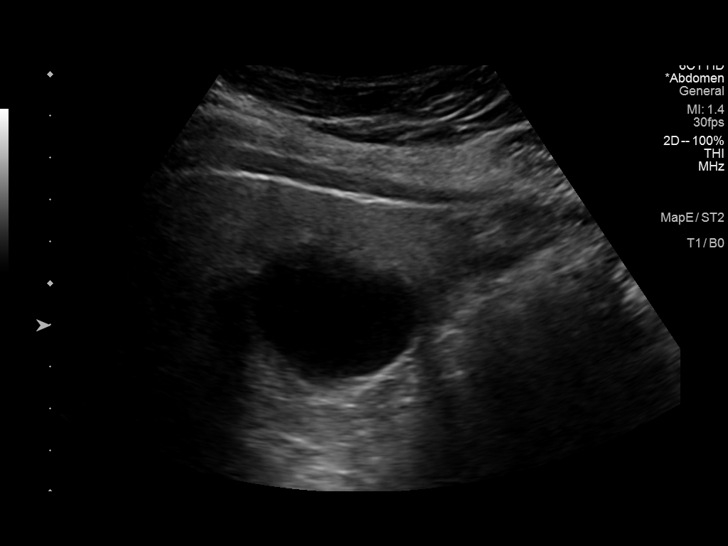
[im 20/59]
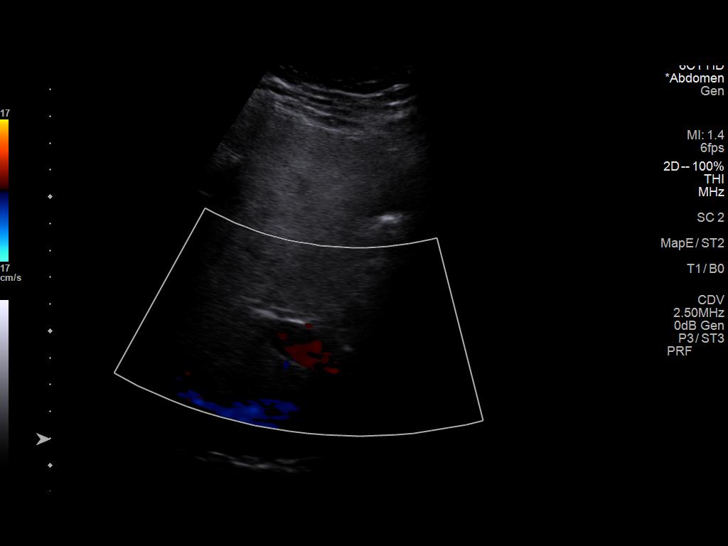
[im 22/59]
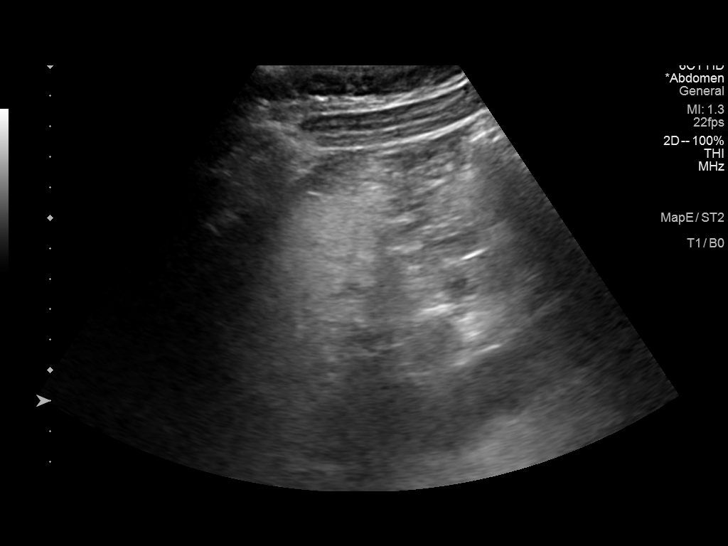
[im 27/59]
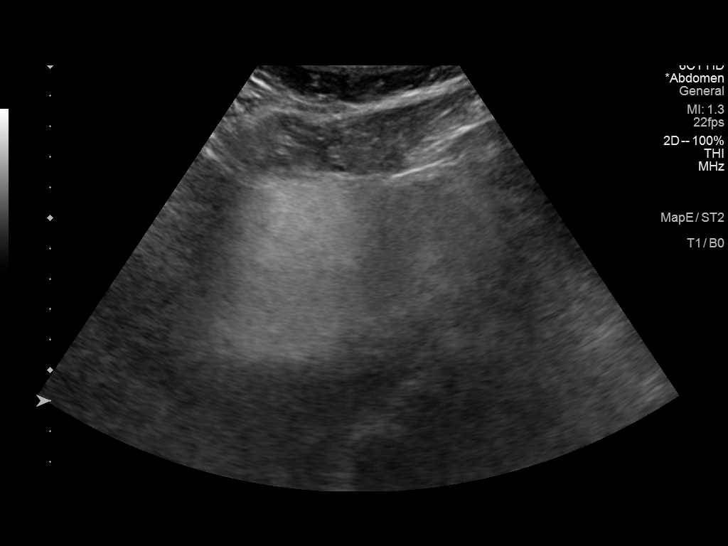
[im 32/59]
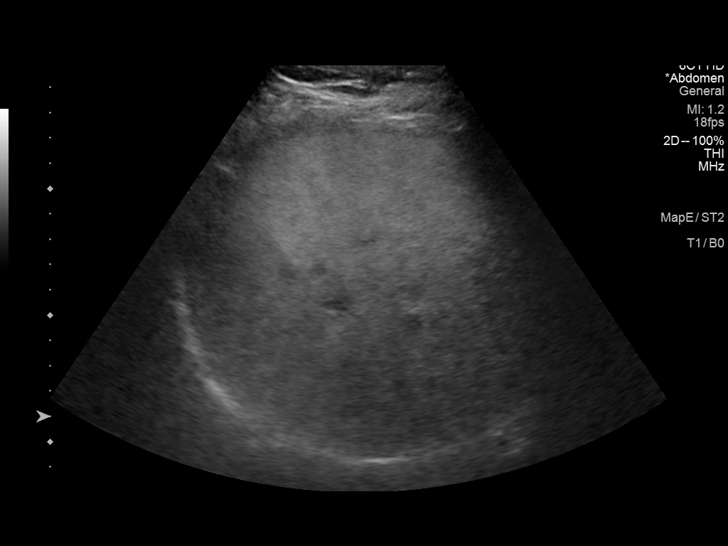
[im 37/59]
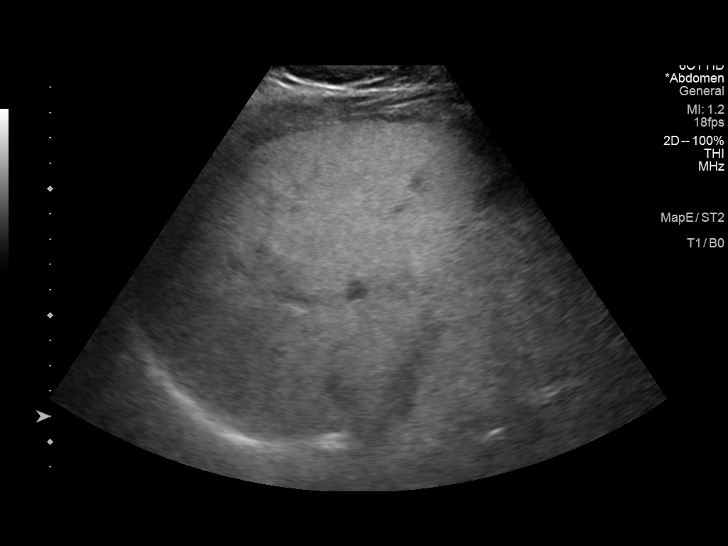
[im 39/59]
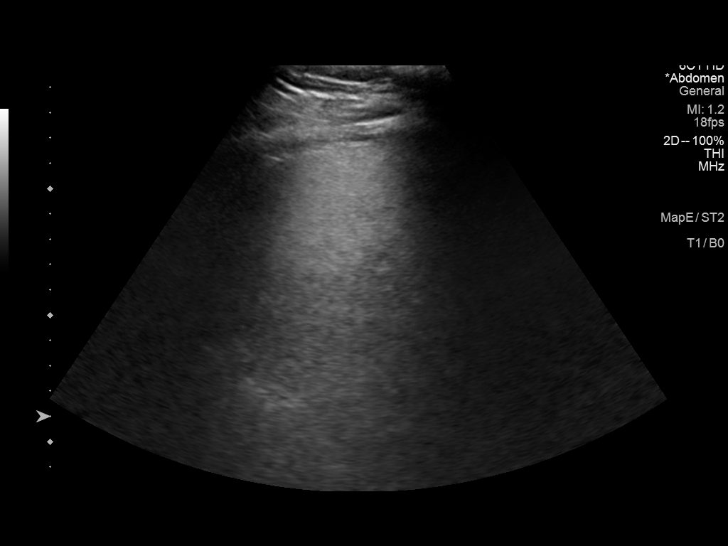
[im 44/59]
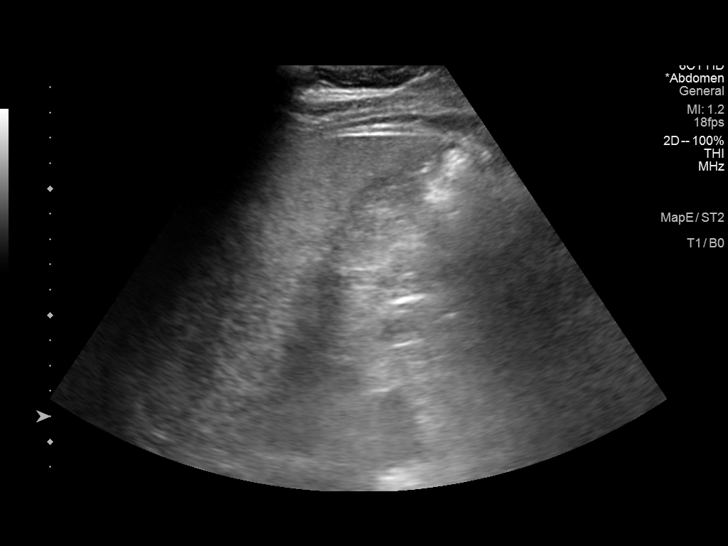
[im 49/59]
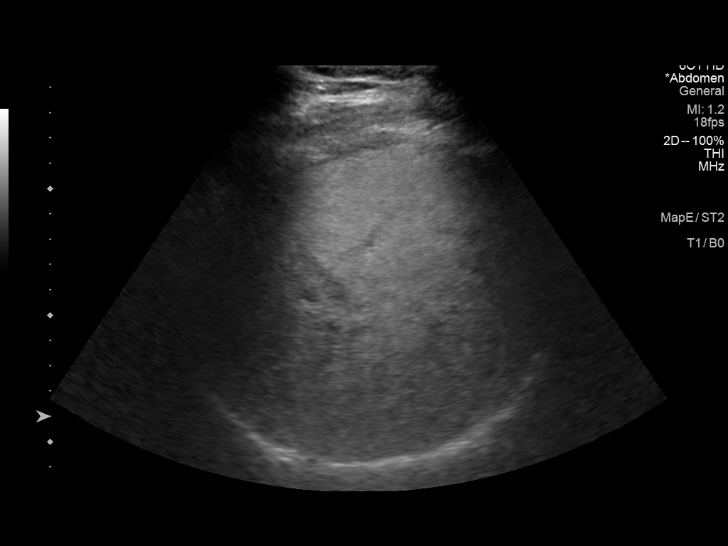
[im 54/59]
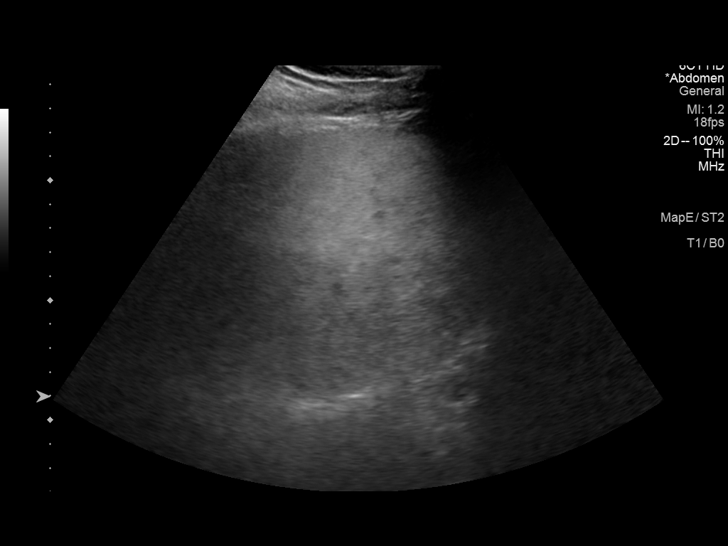
[im 59/59]
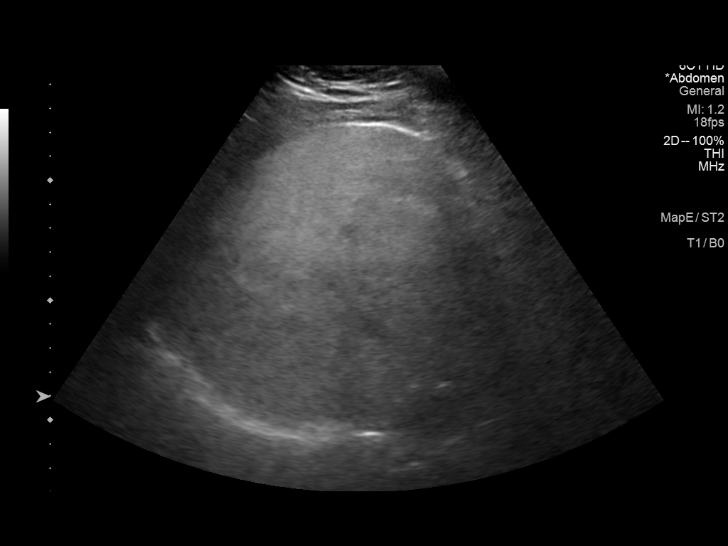

[14 of 25 positions shown; findings below may reference images not displayed]

FINDINGS: Gallbladder:

No gallstones or wall thickening visualized. No sonographic Murphy
sign noted by sonographer.

Common bile duct:

Diameter: 3 mm

Liver:

Diffuse increased parenchymal echogenicity without focal hepatic
lesion. Portal vein is patent on color Doppler imaging with normal
direction of blood flow towards the liver.

Other: None.
IMPRESSION: Diffuse increased echogenicity of the hepatic parenchyma is a
nonspecific indicator of hepatocellular dysfunction, most commonly
steatosis. No focal hepatic lesion identified. If there is continued
clinical concern for hepatic mass, further evaluation with MRI is
recommended.

## 2021-09-05 ENCOUNTER — Ambulatory Visit (INDEPENDENT_AMBULATORY_CARE_PROVIDER_SITE_OTHER): Payer: Commercial Managed Care - PPO | Admitting: Internal Medicine

## 2021-09-05 ENCOUNTER — Encounter: Payer: Self-pay | Admitting: Internal Medicine

## 2021-09-05 VITALS — BP 126/70 | HR 86 | Temp 98.5°F | Ht 74.0 in | Wt 224.0 lb

## 2021-09-05 DIAGNOSIS — E119 Type 2 diabetes mellitus without complications: Secondary | ICD-10-CM

## 2021-09-05 DIAGNOSIS — E559 Vitamin D deficiency, unspecified: Secondary | ICD-10-CM

## 2021-09-05 DIAGNOSIS — E782 Mixed hyperlipidemia: Secondary | ICD-10-CM | POA: Diagnosis not present

## 2021-09-05 DIAGNOSIS — F419 Anxiety disorder, unspecified: Secondary | ICD-10-CM | POA: Insufficient documentation

## 2021-09-05 DIAGNOSIS — N529 Male erectile dysfunction, unspecified: Secondary | ICD-10-CM

## 2021-09-05 HISTORY — DX: Anxiety disorder, unspecified: F41.9

## 2021-09-05 LAB — HEPATIC FUNCTION PANEL
ALT: 29 U/L (ref 0–53)
AST: 17 U/L (ref 0–37)
Albumin: 4.5 g/dL (ref 3.5–5.2)
Alkaline Phosphatase: 61 U/L (ref 39–117)
Bilirubin, Direct: 0.1 mg/dL (ref 0.0–0.3)
Total Bilirubin: 0.5 mg/dL (ref 0.2–1.2)
Total Protein: 7.9 g/dL (ref 6.0–8.3)

## 2021-09-05 LAB — LIPID PANEL
Cholesterol: 204 mg/dL — ABNORMAL HIGH (ref 0–200)
HDL: 50.5 mg/dL (ref >39.00)
LDL Cholesterol: 127 mg/dL — ABNORMAL HIGH (ref 0–99)
NonHDL: 153.67
Total CHOL/HDL Ratio: 4
Triglycerides: 135 mg/dL (ref 0.0–149.0)
VLDL: 27 mg/dL (ref 0.0–40.0)

## 2021-09-05 LAB — BASIC METABOLIC PANEL
BUN: 17 mg/dL (ref 6–23)
CO2: 27 mEq/L (ref 19–32)
Calcium: 10 mg/dL (ref 8.4–10.5)
Chloride: 101 mEq/L (ref 96–112)
Creatinine, Ser: 1.18 mg/dL (ref 0.40–1.50)
GFR: 72.33 mL/min (ref >60.00)
Glucose, Bld: 284 mg/dL — ABNORMAL HIGH (ref 70–99)
Potassium: 4.3 mEq/L (ref 3.5–5.1)
Sodium: 136 mEq/L (ref 135–145)

## 2021-09-05 LAB — HEMOGLOBIN A1C: Hgb A1c MFr Bld: 10.1 % — ABNORMAL HIGH (ref 4.6–6.5)

## 2021-09-05 LAB — VITAMIN D 25 HYDROXY (VIT D DEFICIENCY, FRACTURES): VITD: 24.04 ng/mL — ABNORMAL LOW (ref 30.00–100.00)

## 2021-09-05 MED ORDER — TADALAFIL 20 MG PO TABS: 10.0000 mg | ORAL_TABLET | ORAL | 11 refills | Status: DC | PRN

## 2021-09-05 NOTE — Patient Instructions (Signed)
Please continue all other medications as before, and refills have been done if requested - the cialis as needed  Please have the pharmacy call with any other refills you may need.  Please continue your efforts at being more active, low cholesterol diet, and weight control.  Please keep your appointments with your specialists as you may have planned  Please go to the LAB at the blood drawing area for the tests to be done  You will be contacted by phone if any changes need to be made immediately.  Otherwise, you will receive a letter about your results with an explanation, but please check with MyChart first.  Please remember to sign up for MyChart if you have not done so, as this will be important to you in the future with finding out test results, communicating by private email, and scheduling acute appointments online when needed.  Please make an Appointment to return in 6 months, or sooner if needed, also with Lab Appointment for testing done 3-5 days before at the [place] (so this is for TWO appointments - please see the scheduling desk as you leave)

## 2021-09-05 NOTE — Assessment & Plan Note (Signed)
Last vitamin D Lab Results  Component Value Date   VD25OH 15.51 (L) 03/08/2021   Low, to start oral replacement

## 2021-09-05 NOTE — Progress Notes (Unsigned)
Patient ID: William Sheppard, male   DOB: September 01, 1970, 51 y.o.   MRN: [id number]        Chief Complaint: follow up HTN, HLD and hyperglycemia, anxiety, ED       HPI:  William Sheppard is a 51 y.o. male here overall doing ok.  Pt denies chest pain, increased sob or doe, wheezing, orthopnea, PND, increased LE swelling, palpitations, dizziness or syncope.   Pt denies polydipsia, polyuria, or new focal neuro s/s.    Pt denies fever, wt loss, night sweats, loss of appetite, or other constitutional symptoms  Last a1c was about 8 with endo f/u per pt recall about April 2023.  Wt up to 224  Did accomplish flying with xanax, but does not need otherwise.  ED symptoms are worsening, asks for cialis.    Wt Readings from Last 3 Encounters:  09/05/21 224 lb (101.6 kg)  07/26/21 217 lb (98.4 kg)  06/29/21 217 lb (98.4 kg)   BP Readings from Last 3 Encounters:  09/05/21 126/70  07/26/21 116/76  05/10/21 126/62         Past Medical History:  Diagnosis Date   AKI (acute kidney injury) ([place]) 02/04/2021   Chronic headaches    Cold intolerance 08/01/2020   Daytime somnolence 09/18/2012   Diabetes mellitus type II, non insulin dependent ([place]) 03/11/2015   Dizziness 07/21/2015   DKA (diabetic ketoacidosis) ([place]) 02/04/2021   Encounter for well adult exam with abnormal findings 03/08/2021   Erectile dysfunction 01/09/2020   Essential hypertension 05/09/2021   Hodgkin lymphoma, unspecified, unspecified site ([place]) 05/09/2021   Hodgkin's lymphoma ([place])    Hyperglycemia due to type 2 diabetes mellitus ([place]) 05/09/2021   Hyperlipidemia    Inadequate sleep hygiene 10/23/2012   Left hip pain 02/28/2016   Low back pain 03/08/2021   Low serum testosterone level 11/04/2013   Male hypogonadism 11/05/2013   Mid back pain on left side 05/31/2012   Neck pain on left side 11/08/2016   Numbness 11/03/2012   Pain in right hand 10/09/2017   Pain of right thumb 09/27/2017   Pure hypercholesterolemia 05/09/2021   Right flank pain 12/08/2018    Thalassemia, alpha ([place]) 03/11/2015   Vitamin D deficiency 01/09/2020   Past Surgical History:  Procedure Laterality Date   HERNIA REPAIR     as a child   PORTACATH PLACEMENT  02/12/1997   WISDOM TOOTH EXTRACTION      reports that he has never smoked. He has never used smokeless tobacco. He reports current alcohol use. He reports that he does not use drugs. family history is not on file. He was adopted. Allergies  Allergen Reactions   Dapagliflozin     Other reaction(s): dehydration   Current Outpatient Medications on File Prior to Visit  Medication Sig Dispense Refill   aspirin 81 MG chewable tablet 1 tablet     Continuous Blood Gluc Sensor (DEXCOM G6 SENSOR) MISC Use as directed for 10 days E11.9 9 each 3   Continuous Blood Gluc Transmit (DEXCOM G6 TRANSMITTER) MISC Use as directed 1 every 3 months E11.9 1 each 3   glucose blood test strip And lancets 1/day 250.00 100 each 12   insulin glargine, 2 Unit Dial, (TOUJEO MAX SOLOSTAR) 300 UNIT/ML Solostar Pen Inject 30 Units into the skin. 30 units subq at bedtime     insulin lispro (HUMALOG) 100 UNIT/ML injection 5 units subq for < 200 up to 15 units subq for sugar > 300 sliding scale  Insulin Syringes, Disposable, U-100 0.3 ML MISC Insulin syringes 100 each 0   lisinopril (ZESTRIL) 5 MG tablet Take 1 tablet (5 mg total) by mouth daily. 90 tablet 3   methocarbamol (ROBAXIN) 500 MG tablet Take 1 tablet (500 mg total) by mouth every 6 (six) hours as needed for muscle spasms. 60 tablet 2   pravastatin (PRAVACHOL) 40 MG tablet Take 1 tablet (40 mg total) by mouth daily. 90 tablet 3   ALPRAZolam (XANAX) 0.5 MG tablet Take 1 tablet (0.5 mg total) by mouth 2 (two) times daily as needed for anxiety. (Patient not taking: Reported on 06/29/2021) 20 tablet 0   insulin NPH Human (NOVOLIN N RELION) 100 UNIT/ML injection Inject 0.15 mLs (15 Units total) into the skin 2 (two) times daily at 8 am and 10 pm. 9 mL 3   No current facility-administered  medications on file prior to visit.        ROS:  All others reviewed and negative.  Objective        PE:  BP 126/70 (BP Location: Left Arm, Patient Position: Sitting, Cuff Size: Large)   Pulse 86   Temp 98.5 F (36.9 C) (Oral)   Ht '6\' 2"'$  (1.88 m)   Wt 224 lb (101.6 kg)   SpO2 95%   BMI 28.76 kg/m                 Constitutional: Pt appears in NAD               HENT: Head: NCAT.                Right Ear: External ear normal.                 Left Ear: External ear normal.                Eyes: . Pupils are equal, round, and reactive to light. Conjunctivae and EOM are normal               Nose: without d/c or deformity               Neck: Neck supple. Gross normal ROM               Cardiovascular: Normal rate and regular rhythm.                 Pulmonary/Chest: Effort normal and breath sounds without rales or wheezing.                Abd:  Soft, NT, ND, + BS, no organomegaly               Neurological: Pt is alert. At baseline orientation, motor grossly intact               Skin: Skin is warm. No rashes, no other new lesions, LE edema - none               Psychiatric: Pt behavior is normal without agitation   Micro: none  Cardiac tracings I have personally interpreted today:  none  Pertinent Radiological findings (summarize): none   Lab Results  Component Value Date   WBC 2.7 (L) 03/08/2021   HGB 12.3 (L) 03/08/2021   HCT 39.6 03/08/2021   PLT 208.0 03/08/2021   GLUCOSE 284 (H) 09/05/2021   CHOL 204 (H) 09/05/2021   TRIG 135.0 09/05/2021   HDL 50.50 09/05/2021   LDLDIRECT 82.0 08/26/2019   LDLCALC 127 (H) 09/05/2021  ALT 29 09/05/2021   AST 17 09/05/2021   NA 136 09/05/2021   K 4.3 09/05/2021   CL 101 09/05/2021   CREATININE 1.18 09/05/2021   BUN 17 09/05/2021   CO2 27 09/05/2021   TSH 1.24 03/08/2021   PSA 0.57 03/08/2021   HGBA1C 10.1 (H) 09/05/2021   MICROALBUR 3.7 (H) 03/08/2021   Assessment/Plan:  William Sheppard is a 51 y.o. Black or African American [2]  male with  has a past medical history of AKI (acute kidney injury) ([place]) (02/04/2021), Chronic headaches, Cold intolerance (08/01/2020), Daytime somnolence (09/18/2012), Diabetes mellitus type II, non insulin dependent ([place]) (03/11/2015), Dizziness (07/21/2015), DKA (diabetic ketoacidosis) ([place]) (02/04/2021), Encounter for well adult exam with abnormal findings (03/08/2021), Erectile dysfunction (01/09/2020), Essential hypertension (05/09/2021), Hodgkin lymphoma, unspecified, unspecified site ([place]) (05/09/2021), Hodgkin's lymphoma ([place]), Hyperglycemia due to type 2 diabetes mellitus ([place]) (05/09/2021), Hyperlipidemia, Inadequate sleep hygiene (10/23/2012), Left hip pain (02/28/2016), Low back pain (03/08/2021), Low serum testosterone level (11/04/2013), Male hypogonadism (11/05/2013), Mid back pain on left side (05/31/2012), Neck pain on left side (11/08/2016), Numbness (11/03/2012), Pain in right hand (10/09/2017), Pain of right thumb (09/27/2017), Pure hypercholesterolemia (05/09/2021), Right flank pain (12/08/2018), Thalassemia, alpha ([place]) (03/11/2015), and Vitamin D deficiency (01/09/2020).  Vitamin D deficiency Last vitamin D Lab Results  Component Value Date   VD25OH 15.51 (L) 03/08/2021   Low, to start oral replacement   Hyperlipidemia Lab Results  Component Value Date   LDLCALC 127 (H) 09/05/2021   uncontrolled, pt to change pravastatin to crestor 20 mg qd   Erectile dysfunction Mild worsening, for cialis 20 mg prn  Diabetes mellitus type II, non insulin dependent ([facility]) Lab Results  Component Value Date   HGBA1C 10.1 (H) 09/05/2021   Improved but still unconrolled, pt to continue current medical treatment touneo, humalog, and f/u endo asap   Anxiety Stable, but for xanax prn flying only  Followup: Return in about 6 months (around 03/08/2022).  Cathlean Cower, MD 09/07/2021 9:21 PM [facility]

## 2021-09-07 ENCOUNTER — Encounter: Payer: Self-pay | Admitting: Internal Medicine

## 2021-09-07 ENCOUNTER — Other Ambulatory Visit: Payer: Self-pay | Admitting: Internal Medicine

## 2021-09-07 MED ORDER — ROSUVASTATIN CALCIUM 20 MG PO TABS: 20.0000 mg | ORAL_TABLET | Freq: Every day | ORAL | 3 refills | Status: DC

## 2021-09-07 NOTE — Assessment & Plan Note (Signed)
Mild worsening, for cialis 20 mg prn

## 2021-09-07 NOTE — Assessment & Plan Note (Signed)
Lab Results  Component Value Date   HGBA1C 10.1 (H) 09/05/2021   Improved but still unconrolled, pt to continue current medical treatment touneo, humalog, and f/u endo asap

## 2021-09-07 NOTE — Assessment & Plan Note (Signed)
Stable, but for xanax prn flying only

## 2021-09-07 NOTE — Assessment & Plan Note (Signed)
Lab Results  Component Value Date   LDLCALC 127 (H) 09/05/2021   uncontrolled, pt to change pravastatin to crestor 20 mg qd

## 2022-02-07 ENCOUNTER — Telehealth: Payer: Self-pay | Admitting: Internal Medicine

## 2022-02-07 NOTE — Telephone Encounter (Signed)
Ok to use labs already ordered which do not expire until 2027

## 2022-02-07 NOTE — Telephone Encounter (Signed)
Pt has OV with PCP 03/08/2021 and lab visit 03/01/2021.  Labs ordered in system have expired.

## 2022-03-01 ENCOUNTER — Other Ambulatory Visit: Payer: Commercial Managed Care - PPO

## 2022-03-08 ENCOUNTER — Ambulatory Visit: Payer: Commercial Managed Care - PPO | Admitting: Internal Medicine

## 2022-03-16 ENCOUNTER — Encounter: Payer: Self-pay | Admitting: Internal Medicine

## 2022-03-16 ENCOUNTER — Other Ambulatory Visit: Payer: Self-pay

## 2022-03-16 ENCOUNTER — Other Ambulatory Visit: Payer: Self-pay | Admitting: Internal Medicine

## 2022-03-16 MED ORDER — TADALAFIL 20 MG PO TABS: 10.0000 mg | ORAL_TABLET | ORAL | 11 refills | Status: DC | PRN

## 2022-03-19 NOTE — Telephone Encounter (Signed)
Submitted PA with (Key: [id number]) Rec'd msg [facility] is processing your inquiry and will respond shortly with next steps.Marland KitchenJohny Chess

## 2022-03-20 NOTE — Telephone Encounter (Signed)
Check status on PA still pending.Marland KitchenJohny Sheppard

## 2022-03-21 NOTE — Telephone Encounter (Signed)
Check status on PA still pending.Marland KitchenJohny Sheppard

## 2022-03-22 NOTE — Telephone Encounter (Signed)
Checking  status on PA- [facility] is processing your inquiry and will respond shortly with next steps...William Sheppard

## 2022-03-23 ENCOUNTER — Telehealth: Payer: Self-pay

## 2022-03-23 ENCOUNTER — Other Ambulatory Visit (HOSPITAL_COMMUNITY): Payer: Self-pay

## 2022-03-23 NOTE — Telephone Encounter (Signed)
Pharmacy Patient Advocate Encounter  Prior Authorization for tadalafil has been approved.     Effective dates: 02/17/2022 through 03/18/2022  Spoke with Pharmacy to process.  [facility] Patient Advocate  Letter of approval is in media

## 2022-03-23 NOTE — Telephone Encounter (Signed)
Check on status of PA still pending Called cover-my-meds spoke w/ rep she states she see the PA its been since 03/19/22 no response. We renewed PA w/Key: [id number]. She states [facility] is [facility] if don't receive response can call [phone] to check status.Marland KitchenJohny Chess

## 2022-03-28 NOTE — Telephone Encounter (Signed)
Checking PA on Cialis per msg med was approved 02/17/22 through 03/19/23.../lmb    Media Information  Document Information  AMB Correspondence  [facility]  03/19/2022 07:31  Attached To:  Johnavon L Onder "Editor, commissioning, Provider, MD

## 2022-03-30 ENCOUNTER — Ambulatory Visit: Payer: BC Managed Care – PPO | Admitting: Internal Medicine

## 2022-03-30 ENCOUNTER — Other Ambulatory Visit: Payer: Self-pay

## 2022-03-30 VITALS — BP 128/76 | HR 91 | Temp 98.0°F | Ht 74.0 in | Wt 217.0 lb

## 2022-03-30 DIAGNOSIS — M79641 Pain in right hand: Secondary | ICD-10-CM | POA: Diagnosis not present

## 2022-03-30 DIAGNOSIS — N529 Male erectile dysfunction, unspecified: Secondary | ICD-10-CM

## 2022-03-30 DIAGNOSIS — Z125 Encounter for screening for malignant neoplasm of prostate: Secondary | ICD-10-CM

## 2022-03-30 DIAGNOSIS — E559 Vitamin D deficiency, unspecified: Secondary | ICD-10-CM

## 2022-03-30 DIAGNOSIS — M79642 Pain in left hand: Secondary | ICD-10-CM

## 2022-03-30 DIAGNOSIS — Z0000 Encounter for general adult medical examination without abnormal findings: Secondary | ICD-10-CM

## 2022-03-30 DIAGNOSIS — E119 Type 2 diabetes mellitus without complications: Secondary | ICD-10-CM | POA: Diagnosis not present

## 2022-03-30 DIAGNOSIS — E782 Mixed hyperlipidemia: Secondary | ICD-10-CM

## 2022-03-30 DIAGNOSIS — Z0001 Encounter for general adult medical examination with abnormal findings: Secondary | ICD-10-CM

## 2022-03-30 LAB — MICROALBUMIN / CREATININE URINE RATIO
Creatinine,U: 178.7 mg/dL
Microalb Creat Ratio: 5.4 mg/g (ref 0.0–30.0)
Microalb, Ur: 9.6 mg/dL — ABNORMAL HIGH (ref 0.0–1.9)

## 2022-03-30 LAB — BASIC METABOLIC PANEL
BUN: 21 mg/dL (ref 6–23)
CO2: 29 mEq/L (ref 19–32)
Calcium: 10.2 mg/dL (ref 8.4–10.5)
Chloride: 105 mEq/L (ref 96–112)
Creatinine, Ser: 1.12 mg/dL (ref 0.40–1.50)
GFR: 76.7 mL/min (ref >60.00)
Glucose, Bld: 67 mg/dL — ABNORMAL LOW (ref 70–99)
Potassium: 4.2 mEq/L (ref 3.5–5.1)
Sodium: 141 mEq/L (ref 135–145)

## 2022-03-30 LAB — CBC WITH DIFFERENTIAL/PLATELET
Basophils Absolute: 0 10*3/uL (ref 0.0–0.1)
Basophils Relative: 0.2 % (ref 0.0–3.0)
Eosinophils Absolute: 0 10*3/uL (ref 0.0–0.7)
Eosinophils Relative: 0.9 % (ref 0.0–5.0)
HCT: 43.1 % (ref 39.0–52.0)
Hemoglobin: 13.5 g/dL (ref 13.0–17.0)
Lymphocytes Relative: 45.3 % (ref 12.0–46.0)
Lymphs Abs: 2.1 10*3/uL (ref 0.7–4.0)
MCHC: 31.3 g/dL (ref 30.0–36.0)
MCV: 70.1 fl — ABNORMAL LOW (ref 78.0–100.0)
Monocytes Absolute: 0.4 10*3/uL (ref 0.1–1.0)
Monocytes Relative: 8.4 % (ref 3.0–12.0)
Neutro Abs: 2.1 10*3/uL (ref 1.4–7.7)
Neutrophils Relative %: 45.2 % (ref 43.0–77.0)
Platelets: 210 10*3/uL (ref 150.0–400.0)
RBC: 6.16 Mil/uL — ABNORMAL HIGH (ref 4.22–5.81)
RDW: 15 % (ref 11.5–15.5)
WBC: 4.7 10*3/uL (ref 4.0–10.5)

## 2022-03-30 LAB — URINALYSIS, ROUTINE W REFLEX MICROSCOPIC
Bilirubin Urine: NEGATIVE
Ketones, ur: NEGATIVE
Leukocytes,Ua: NEGATIVE
Nitrite: NEGATIVE
Specific Gravity, Urine: >=1.03 — AB (ref 1.000–1.030)
Total Protein, Urine: NEGATIVE
Urine Glucose: NEGATIVE
Urobilinogen, UA: 0.2 (ref 0.0–1.0)
pH: 6 (ref 5.0–8.0)

## 2022-03-30 LAB — HEPATIC FUNCTION PANEL
ALT: 30 U/L (ref 0–53)
AST: 19 U/L (ref 0–37)
Albumin: 4.7 g/dL (ref 3.5–5.2)
Alkaline Phosphatase: 53 U/L (ref 39–117)
Bilirubin, Direct: 0.1 mg/dL (ref 0.0–0.3)
Total Bilirubin: 0.4 mg/dL (ref 0.2–1.2)
Total Protein: 7.9 g/dL (ref 6.0–8.3)

## 2022-03-30 LAB — TSH: TSH: 0.85 u[IU]/mL (ref 0.35–5.50)

## 2022-03-30 LAB — HEMOGLOBIN A1C: Hgb A1c MFr Bld: 9 % — ABNORMAL HIGH (ref 4.6–6.5)

## 2022-03-30 LAB — VITAMIN D 25 HYDROXY (VIT D DEFICIENCY, FRACTURES): VITD: 19.09 ng/mL — ABNORMAL LOW (ref 30.00–100.00)

## 2022-03-30 LAB — TESTOSTERONE: Testosterone: 302.08 ng/dL (ref 300.00–890.00)

## 2022-03-30 LAB — LIPID PANEL
Cholesterol: 205 mg/dL — ABNORMAL HIGH (ref 0–200)
HDL: 58.5 mg/dL (ref >39.00)
LDL Cholesterol: 134 mg/dL — ABNORMAL HIGH (ref 0–99)
NonHDL: 146.7
Total CHOL/HDL Ratio: 4
Triglycerides: 62 mg/dL (ref 0.0–149.0)
VLDL: 12.4 mg/dL (ref 0.0–40.0)

## 2022-03-30 LAB — PSA: PSA: 1.15 ng/mL (ref 0.10–4.00)

## 2022-03-30 MED ORDER — GLUCOSE BLOOD VI STRP: ORAL_STRIP | 12 refills | Status: DC

## 2022-03-30 MED ORDER — CELECOXIB 200 MG PO CAPS: 200.0000 mg | ORAL_CAPSULE | Freq: Two times a day (BID) | ORAL | 3 refills | Status: DC | PRN

## 2022-03-30 MED ORDER — PRAVASTATIN SODIUM 40 MG PO TABS: 40.0000 mg | ORAL_TABLET | Freq: Every day | ORAL | 3 refills | Status: DC

## 2022-03-30 MED ORDER — ROSUVASTATIN CALCIUM 20 MG PO TABS: 20.0000 mg | ORAL_TABLET | Freq: Every day | ORAL | 3 refills | Status: DC

## 2022-03-30 MED ORDER — LISINOPRIL 5 MG PO TABS: 5.0000 mg | ORAL_TABLET | Freq: Every day | ORAL | 3 refills | Status: DC

## 2022-03-30 MED ORDER — INSULIN LISPRO 100 UNIT/ML IJ SOLN: 5.0000 [IU] | INTRAMUSCULAR | 2 refills | Status: DC | PRN

## 2022-03-30 MED ORDER — DEXCOM G7 RECEIVER DEVI: 0 refills | Status: AC

## 2022-03-30 MED ORDER — TADALAFIL 20 MG PO TABS: 10.0000 mg | ORAL_TABLET | ORAL | 11 refills | Status: DC | PRN

## 2022-03-30 MED ORDER — DEXCOM G7 SENSOR MISC: 3 refills | Status: DC

## 2022-03-30 MED ORDER — TOUJEO MAX SOLOSTAR 300 UNIT/ML ~~LOC~~ SOPN: 30.0000 [IU] | PEN_INJECTOR | Freq: Every evening | SUBCUTANEOUS | 3 refills | Status: DC

## 2022-03-30 NOTE — Progress Notes (Unsigned)
Patient ID: William Sheppard, male   DOB: 07-01-1970, 52 y.o.   MRN: [id number]         Chief Complaint:: wellness exam and dm, htn, bilateral hand pain and left fingers numbness, dm, Ed, low vit d       HPI:  William Sheppard is a 52 y.o. 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  Lost with physcial job.  Pt denies chest pain, increased sob or doe, wheezing, orthopnea, PND, increased LE swelling, palpitations, dizziness or syncope.   Pt denies polydipsia, polyuria, or new focal neuro s/s.    Pt denies fever, wt loss, night sweats, loss of appetite, or other constitutional symptoms  Needs new endo referral as Dr William Sheppard has retired.   Wt Readings from Last 3 Encounters:  03/30/22 217 lb (98.4 kg)  09/05/21 224 lb (101.6 kg)  07/26/21 217 lb (98.4 kg)   BP Readings from Last 3 Encounters:  03/30/22 128/76  09/05/21 126/70  07/26/21 116/76   Immunization History  Administered Date(s) Administered   PFIZER(Purple Top)SARS-COV-2 Vaccination 05/30/2019, 06/21/2019   Health Maintenance Due  Topic Date Due   DTaP/Tdap/Td (1 - Tdap) Never done      Past Medical History:  Diagnosis Date   AKI (acute kidney injury) ([place]) 02/04/2021   Anxiety 09/05/2021   Chest pain of uncertain etiology Q000111Q   Chronic headaches    Cold intolerance 08/01/2020   Daytime somnolence 09/18/2012   Diabetes mellitus due to underlying condition with unspecified complications ([place]) Q000111Q   Diabetes mellitus type II, non insulin dependent ([place]) 03/11/2015   Dizziness 07/21/2015   DKA (diabetic ketoacidosis) ([place]) 02/04/2021   Encounter for well adult exam with abnormal findings 03/08/2021   Erectile dysfunction 01/09/2020   Essential hypertension 05/09/2021   Hodgkin lymphoma, unspecified,  unspecified site ([place]) 05/09/2021   Hodgkin lymphoma, unspecified, unspecified site ([place]) 05/09/2021   Hodgkin's lymphoma ([place])    Hyperglycemia due to type 2 diabetes mellitus ([place]) 05/09/2021   Hyperlipidemia    Inadequate sleep hygiene 10/23/2012   Left hip pain 02/28/2016   Low back pain 03/08/2021   Low serum testosterone level 11/04/2013   Male hypogonadism 11/05/2013   Mid back pain on left side 05/31/2012   Neck pain on left side 11/08/2016   Numbness 11/03/2012   Pain in right hand 10/09/2017   Pain of right thumb 09/27/2017   Pure hypercholesterolemia 05/09/2021   Right flank pain 12/08/2018   Thalassemia, alpha ([place]) 03/11/2015   Vitamin D deficiency 01/09/2020   Past Surgical History:  Procedure Laterality Date   HERNIA REPAIR     as a child   PORTACATH PLACEMENT  02/12/1997   WISDOM TOOTH EXTRACTION      reports that he has never smoked. He has never used smokeless tobacco. He reports current alcohol use. He reports that he does not use drugs. family history is not on file. He was adopted. Allergies  Allergen Reactions   Dapagliflozin     Other reaction(s): dehydration   Current Outpatient Medications on File Prior to Visit  Medication Sig Dispense Refill   aspirin 81 MG chewable tablet Chew 81  mg by mouth daily.     Insulin Syringes, Disposable, U-100 0.3 ML MISC Insulin syringes 100 each 0   No current facility-administered medications on file prior to visit.        ROS:  All others reviewed and negative.  Objective        PE:  BP 128/76 (BP Location: Right Arm, Patient Position: Sitting, Cuff Size: Large)   Pulse 91   Temp 98 F (36.7 C) (Oral)   Ht 6' 2"$  (1.88 m)   Wt 217 lb (98.4 kg)   SpO2 94%   BMI 27.86 kg/m                 Constitutional: Pt appears in NAD               HENT: Head: NCAT.                Right Ear: External ear normal.                 Left Ear: External ear normal.                Eyes: . Pupils are equal, round, and  reactive to light. Conjunctivae and EOM are normal               Nose: without d/c or deformity               Neck: Neck supple. Gross normal ROM               Cardiovascular: Normal rate and regular rhythm.                 Pulmonary/Chest: Effort normal and breath sounds without rales or wheezing.                Abd:  Soft, NT, ND, + BS, no organomegaly               Neurological: Pt is alert. At baseline orientation, motor grossly intact               Skin: Skin is warm. No rashes, no other new lesions, LE edema - none               Psychiatric: Pt behavior is normal without agitation   Micro: none  Cardiac tracings I have personally interpreted today:  none  Pertinent Radiological findings (summarize): none   Lab Results  Component Value Date   WBC 4.7 03/30/2022   HGB 13.5 03/30/2022   HCT 43.1 03/30/2022   PLT 210.0 03/30/2022   GLUCOSE 67 (L) 03/30/2022   CHOL 205 (H) 03/30/2022   TRIG 62.0 03/30/2022   HDL 58.50 03/30/2022   LDLDIRECT 82.0 08/26/2019   LDLCALC 134 (H) 03/30/2022   ALT 30 03/30/2022   AST 19 03/30/2022   NA 141 03/30/2022   K 4.2 03/30/2022   CL 105 03/30/2022   CREATININE 1.12 03/30/2022   BUN 21 03/30/2022   CO2 29 03/30/2022   TSH 0.85 03/30/2022   PSA 1.15 03/30/2022   HGBA1C 9.0 (H) 03/30/2022   MICROALBUR 9.6 (H) 03/30/2022   Assessment/Plan:  William Sheppard is a 52 y.o. Black or African American [2] male with  has a past medical history of AKI (acute kidney injury) ([place]) (02/04/2021), Anxiety (09/05/2021), Chest pain of uncertain etiology (Q000111Q), Chronic headaches, Cold intolerance (08/01/2020), Daytime somnolence (09/18/2012), Diabetes mellitus due to underlying condition with unspecified complications ([place]) (Q000111Q), Diabetes mellitus type II,  non insulin dependent ([place]) (03/11/2015), Dizziness (07/21/2015), DKA (diabetic ketoacidosis) ([place]) (02/04/2021), Encounter for well adult exam with abnormal findings (03/08/2021), Erectile  dysfunction (01/09/2020), Essential hypertension (05/09/2021), Hodgkin lymphoma, unspecified, unspecified site [facility]) (05/09/2021), Hodgkin lymphoma, unspecified, unspecified site ([place]) (05/09/2021), Hodgkin's lymphoma ([place]), Hyperglycemia due to type 2 diabetes mellitus ([place]) (05/09/2021), Hyperlipidemia, Inadequate sleep hygiene (10/23/2012), Left hip pain (02/28/2016), Low back pain (03/08/2021), Low serum testosterone level (11/04/2013), Male hypogonadism (11/05/2013), Mid back pain on left side (05/31/2012), Neck pain on left side (11/08/2016), Numbness (11/03/2012), Pain in right hand (10/09/2017), Pain of right thumb (09/27/2017), Pure hypercholesterolemia (05/09/2021), Right flank pain (12/08/2018), Thalassemia, alpha ([place]) (03/11/2015), and Vitamin D deficiency (01/09/2020).  Encounter for well adult exam with abnormal findings Age and sex appropriate education and counseling updated with regular exercise and diet Referrals for preventative services - none needed Immunizations addressed - declines flu shot, for shingrix and tdap at pharmacy Smoking counseling  - none needed Evidence for depression or other mood disorder - none significant Most recent labs reviewed. I have personally reviewed and have noted: 1) the patient's medical and social history 2) The patient's current medications and supplements 3) The patient's height, weight, and BMI have been recorded in the chart   Bilateral hand pain C/w overuse injury and possible left CTS - for celebrex 200 bid prn, and refer sports medicine  Diabetes mellitus type II, non insulin dependent ([place]) Lab Results  Component Value Date   HGBA1C 9.0 (H) 03/30/2022   Uncontrolled,, pt to continue current medical treatment toujeo 30 qhs, humalog 5-15 u tid ac; refer new endo   Erectile dysfunction Worsening recent, for PDE5 prn asd  Hyperlipidemia Lab Results  Component Value Date   LDLCALC 134 (H) 03/30/2022   Uncontrolled, goal ldl <  70, pt for increased crestor 20 mg qd   Vitamin D deficiency Last vitamin D Lab Results  Component Value Date   VD25OH 19.09 (L) 03/30/2022   Low, to start oral replacement  Followup: Return in about 6 months (around 09/28/2022).  Cathlean Cower, MD 03/31/2022 9:56 PM [facility]

## 2022-03-30 NOTE — Patient Instructions (Addendum)
Please have your Shingrix (shingles) shots done at your local pharmacy.  Please take all new medication as prescribed - the celebrex as needed for anti-inflammatory  Please continue all other medications as before, and refills have been done if requested.  Please have the pharmacy call with any other refills you may need.  Please continue your efforts at being more active, low cholesterol diet, and weight control.  You are otherwise up to date with prevention measures today.  Please keep your appointments with your specialists as you may have planned  You will be contacted regarding the referral for: Sports Medicine, and Endocrinology  Please go to the LAB at the blood drawing area for the tests to be done  You will be contacted by phone if any changes need to be made immediately.  Otherwise, you will receive a letter about your results with an explanation, but please check with MyChart first.  Please remember to sign up for MyChart if you have not done so, as this will be important to you in the future with finding out test results, communicating by private email, and scheduling acute appointments online when needed.  Please make an Appointment to return in 6 months, or sooner if needed

## 2022-03-31 ENCOUNTER — Encounter: Payer: Self-pay | Admitting: Internal Medicine

## 2022-03-31 NOTE — Assessment & Plan Note (Signed)
Lab Results  Component Value Date   HGBA1C 9.0 (H) 03/30/2022   Uncontrolled,, pt to continue current medical treatment toujeo 30 qhs, humalog 5-15 u tid ac; refer new endo

## 2022-03-31 NOTE — Assessment & Plan Note (Signed)
Worsening recent, for PDE5 prn asd

## 2022-03-31 NOTE — Assessment & Plan Note (Signed)
C/w overuse injury and possible left CTS - for celebrex 200 bid prn, and refer sports medicine

## 2022-03-31 NOTE — Assessment & Plan Note (Signed)
Lab Results  Component Value Date   LDLCALC 134 (H) 03/30/2022   Uncontrolled, goal ldl < 70, pt for increased crestor 20 mg qd

## 2022-03-31 NOTE — Assessment & Plan Note (Signed)
Last vitamin D Lab Results  Component Value Date   VD25OH 19.09 (L) 03/30/2022   Low, to start oral replacement

## 2022-03-31 NOTE — Assessment & Plan Note (Signed)
Age and sex appropriate education and counseling updated with regular exercise and diet Referrals for preventative services - none needed Immunizations addressed - declines flu shot, for shingrix and tdap at pharmacy Smoking counseling  - none needed Evidence for depression or other mood disorder - none significant Most recent labs reviewed. I have personally reviewed and have noted: 1) the patient's medical and social history 2) The patient's current medications and supplements 3) The patient's height, weight, and BMI have been recorded in the chart

## 2022-04-02 ENCOUNTER — Ambulatory Visit: Payer: BC Managed Care – PPO | Attending: Cardiology | Admitting: Cardiology

## 2022-04-02 ENCOUNTER — Encounter: Payer: Self-pay | Admitting: Cardiology

## 2022-04-02 VITALS — BP 132/64 | HR 84 | Ht 74.0 in | Wt 221.0 lb

## 2022-04-02 DIAGNOSIS — E7800 Pure hypercholesterolemia, unspecified: Secondary | ICD-10-CM

## 2022-04-02 DIAGNOSIS — I10 Essential (primary) hypertension: Secondary | ICD-10-CM | POA: Diagnosis not present

## 2022-04-02 DIAGNOSIS — E088 Diabetes mellitus due to underlying condition with unspecified complications: Secondary | ICD-10-CM

## 2022-04-02 DIAGNOSIS — R011 Cardiac murmur, unspecified: Secondary | ICD-10-CM | POA: Diagnosis not present

## 2022-04-02 NOTE — Progress Notes (Signed)
Cardiology Office Note:    Date:  04/02/2022   ID:  William Sheppard, DOB 08/16/1970, MRN [id number]  PCP:  Biagio Borg, MD  Cardiologist:  Jenean Lindau, MD   Referring MD: Biagio Borg, MD    ASSESSMENT:    1. Essential hypertension   2. Diabetes mellitus due to underlying condition with unspecified complications ([place])   3. Pure hypercholesterolemia    PLAN:    In order of problems listed above:  Primary prevention stressed with the patient.  Importance of compliance with diet medication stressed and vocalized understanding.  He was advised to walk at least half an hour a day 5 days a week and he promises to do so. Essential hypertension: Blood pressure stable and diet was emphasized.  Lifestyle modification urged. Mixed dyslipidemia: On lipid-lowering medications.  Does not take them regularly.  Lipids are not at goal.  I had an extensive talk with him again about this.  He promises to do better.  He will go back to his primary care in 3 to 4 months and get all blood work done including lipids and A1c. Uncontrolled diabetes mellitus: Again A1c not at goal plan lifestyle modification urged him.  Thinks that he is going to be doing better at this time with diet and exercise. Echocardiogram will be done to assess murmur heard on auscultation. Patient will be seen in follow-up appointment in 6 months or earlier if the patient has any concerns    Medication Adjustments/Labs and Tests Ordered: Current medicines are reviewed at length with the patient today.  Concerns regarding medicines are outlined above.  No orders of the defined types were placed in this encounter.  No orders of the defined types were placed in this encounter.    No chief complaint on file.    History of Present Illness:    William Sheppard is a 52 y.o. male.  Patient has past medical history of essential hypertension, dyslipidemia and diabetes mellitus.  He leads a sedentary lifestyle.  He mentions to me  that he has not taken his medications regularly.  No chest pain orthopnea or PND.  At the time of my evaluation, the patient is alert awake oriented and in no distress.  Past Medical History:  Diagnosis Date   AKI (acute kidney injury) ([place]) 02/04/2021   Anxiety 09/05/2021   Chest pain of uncertain etiology Q000111Q   Chronic headaches    Cold intolerance 08/01/2020   Daytime somnolence 09/18/2012   Diabetes mellitus due to underlying condition with unspecified complications ([place]) Q000111Q   Diabetes mellitus type II, non insulin dependent ([place]) 03/11/2015   Dizziness 07/21/2015   DKA (diabetic ketoacidosis) ([place]) 02/04/2021   Encounter for well adult exam with abnormal findings 03/08/2021   Erectile dysfunction 01/09/2020   Essential hypertension 05/09/2021   Hodgkin lymphoma, unspecified, unspecified site ([place]) 05/09/2021   Hodgkin lymphoma, unspecified, unspecified site ([place]) 05/09/2021   Hodgkin's lymphoma ([place])    Hyperglycemia due to type 2 diabetes mellitus ([place]) 05/09/2021   Hyperlipidemia    Inadequate sleep hygiene 10/23/2012   Left hip pain 02/28/2016   Low back pain 03/08/2021   Low serum testosterone level 11/04/2013   Male hypogonadism 11/05/2013   Mid back pain on left side 05/31/2012   Neck pain on left side 11/08/2016   Numbness 11/03/2012   Pain in right hand 10/09/2017   Pain of right thumb 09/27/2017   Pure hypercholesterolemia 05/09/2021   Right flank pain 12/08/2018  Thalassemia, alpha ([place]) 03/11/2015   Vitamin D deficiency 01/09/2020    Past Surgical History:  Procedure Laterality Date   HERNIA REPAIR     as a child   PORTACATH PLACEMENT  02/12/1997   WISDOM TOOTH EXTRACTION      Current Medications: Current Meds  Medication Sig   aspirin 81 MG chewable tablet Chew 81 mg by mouth daily.   celecoxib (CELEBREX) 200 MG capsule Take 1 capsule (200 mg total) by mouth 2 (two) times daily as needed.   Continuous Blood Gluc Receiver (DEXCOM G7  RECEIVER) DEVI Use as directed four times per day E11.9   Continuous Blood Gluc Sensor (DEXCOM G7 SENSOR) MISC Apply 1 sensor as directed once every 2 weeks E11.9   glucose blood test strip And lancets 4/day E11.9   insulin glargine, 2 Unit Dial, (TOUJEO MAX SOLOSTAR) 300 UNIT/ML Solostar Pen Inject 30 Units into the skin at bedtime.   insulin lispro (HUMALOG) 100 UNIT/ML injection Inject 0.05-0.15 mLs (5-15 Units total) into the skin as needed for high blood sugar (sliding scale).   Insulin Syringes, Disposable, U-100 0.3 ML MISC Insulin syringes   lisinopril (ZESTRIL) 5 MG tablet Take 1 tablet (5 mg total) by mouth daily.   pravastatin (PRAVACHOL) 40 MG tablet Take 1 tablet (40 mg total) by mouth daily.   rosuvastatin (CRESTOR) 20 MG tablet Take 1 tablet (20 mg total) by mouth daily.   tadalafil (CIALIS) 20 MG tablet Take 0.5-1 tablets (10-20 mg total) by mouth every other day as needed for erectile dysfunction.     Allergies:   Dapagliflozin   Social History   Socioeconomic History   Marital status: Married    Spouse name: Not on file   Number of children: Not on file   Years of education: masters   Highest education level: Not on file  Occupational History   Occupation: Lobbyist: [employer]  Tobacco Use   Smoking status: Never   Smokeless tobacco: Never  Vaping Use   Vaping Use: Never used  Substance and Sexual Activity   Alcohol use: Yes    Comment: social   Drug use: No   Sexual activity: Not on file  Other Topics Concern   Not on file  Social History Narrative   Not on file   Social Determinants of Health   Financial Resource Strain: Not on file  Food Insecurity: Not on file  Transportation Needs: Not on file  Physical Activity: Not on file  Stress: Not on file  Social Connections: Not on file     Family History: The patient's family history is negative for Colon cancer, Colon polyps, Esophageal cancer, Stomach cancer, and Rectal cancer.  He was adopted.  ROS:   Please see the history of present illness.    All other systems reviewed and are negative.  EKGs/Labs/Other Studies Reviewed:    The following studies were reviewed today: I discussed my findings with the patient at length.   Recent Labs: 03/30/2022: ALT 30; BUN 21; Creatinine, Ser 1.12; Hemoglobin 13.5; Platelets 210.0; Potassium 4.2; Sodium 141; TSH 0.85  Recent Lipid Panel    Component Value Date/Time   CHOL 205 (H) 03/30/2022 1549   TRIG 62.0 03/30/2022 1549   HDL 58.50 03/30/2022 1549   CHOLHDL 4 03/30/2022 1549   VLDL 12.4 03/30/2022 1549   LDLCALC 134 (H) 03/30/2022 1549   LDLDIRECT 82.0 08/26/2019 1637    Physical Exam:    VS:  BP 132/64  Pulse 84   Ht 6' 2"$  (1.88 m)   Wt 221 lb (100.2 kg)   SpO2 95%   BMI 28.37 kg/m     Wt Readings from Last 3 Encounters:  04/02/22 221 lb (100.2 kg)  03/30/22 217 lb (98.4 kg)  09/05/21 224 lb (101.6 kg)     GEN: Patient is in no acute distress HEENT: Normal NECK: No JVD; No carotid bruits LYMPHATICS: No lymphadenopathy CARDIAC: Hear sounds regular, 2/6 systolic murmur at the apex. RESPIRATORY:  Clear to auscultation without rales, wheezing or rhonchi  ABDOMEN: Soft, non-tender, non-distended MUSCULOSKELETAL:  No edema; No deformity  SKIN: Warm and dry NEUROLOGIC:  Alert and oriented x 3 PSYCHIATRIC:  Normal affect   Signed, Jenean Lindau, MD  04/02/2022 9:22 AM    [facility]

## 2022-04-02 NOTE — Patient Instructions (Signed)
Medication Instructions:  Your physician recommends that you continue on your current medications as directed. Please refer to the Current Medication list given to you today.  *If you need a refill on your cardiac medications before your next appointment, please call your pharmacy*   Lab Work: NONE If you have labs (blood work) drawn today and your tests are completely normal, you will receive your results only by: [place] (if you have MyChart) OR A paper copy in the mail If you have any lab test that is abnormal or we need to change your treatment, we will call you to review the results.   Testing/Procedures: Your physician has requested that you have an echocardiogram. Echocardiography is a painless test that uses sound waves to create images of your heart. It provides your doctor with information about the size and shape of your heart and how well your heart's chambers and valves are working. This procedure takes approximately one hour. There are no restrictions for this procedure. Please do NOT wear cologne, perfume, aftershave, or lotions (deodorant is allowed). Please arrive 15 minutes prior to your appointment time.    Follow-Up: At [facility], you and your health needs are our priority.  As part of our continuing mission to provide you with exceptional heart care, we have created designated Provider Care Teams.  These Care Teams include your primary Cardiologist (physician) and Advanced Practice Providers (APPs -  Physician Assistants and Nurse Practitioners) who all work together to provide you with the care you need, when you need it.  We recommend signing up for the patient portal called "MyChart".  Sign up information is provided on this After Visit Summary.  MyChart is used to connect with patients for Virtual Visits (Telemedicine).  Patients are able to view lab/test results, encounter notes, upcoming appointments, etc.  Non-urgent messages can be sent to your  provider as well.   To learn more about what you can do with MyChart, go to [email or web address].    Your next appointment:   9 month(s)  Provider:   Jyl Heinz, MD    Other Instructions

## 2022-04-03 ENCOUNTER — Other Ambulatory Visit: Payer: Self-pay | Admitting: Internal Medicine

## 2022-04-03 MED ORDER — ROSUVASTATIN CALCIUM 40 MG PO TABS: 40.0000 mg | ORAL_TABLET | Freq: Every day | ORAL | 3 refills | Status: DC

## 2022-04-03 MED ORDER — TOUJEO MAX SOLOSTAR 300 UNIT/ML ~~LOC~~ SOPN: 40.0000 [IU] | PEN_INJECTOR | Freq: Every evening | SUBCUTANEOUS | 3 refills | Status: DC

## 2022-04-05 ENCOUNTER — Telehealth: Payer: Self-pay | Admitting: Internal Medicine

## 2022-04-05 NOTE — Telephone Encounter (Signed)
Patient returned call about lab results. He would like a call back at [phone].

## 2022-04-06 NOTE — Telephone Encounter (Signed)
Patient informed of results and med increase and change. Patient is concerned about taking both the Lisinopril and Celebrex as it may cause heart issues. Please advise if possible on behalf of Dr.John

## 2022-04-06 NOTE — Telephone Encounter (Signed)
He received a warning that taking both meds together could cause heart issues and wanted to know if he should continue them. I did inform him that Dr.John did not advise him to discontinue any medication at this time.

## 2022-04-06 NOTE — Telephone Encounter (Signed)
Looks like medications were updated and sent to pharmacy.

## 2022-04-09 NOTE — Telephone Encounter (Signed)
Patient informed and expressed understanding

## 2022-04-11 ENCOUNTER — Ambulatory Visit (HOSPITAL_BASED_OUTPATIENT_CLINIC_OR_DEPARTMENT_OTHER)
Admission: RE | Admit: 2022-04-11 | Discharge: 2022-04-11 | Disposition: A | Discharge status: Home / Self Care | Payer: BC Managed Care – PPO | Source: Ambulatory Visit | Attending: Cardiology | Admitting: Cardiology

## 2022-04-11 DIAGNOSIS — I10 Essential (primary) hypertension: Secondary | ICD-10-CM | POA: Diagnosis not present

## 2022-04-11 DIAGNOSIS — E7800 Pure hypercholesterolemia, unspecified: Secondary | ICD-10-CM

## 2022-04-11 DIAGNOSIS — R011 Cardiac murmur, unspecified: Secondary | ICD-10-CM

## 2022-04-11 DIAGNOSIS — E088 Diabetes mellitus due to underlying condition with unspecified complications: Secondary | ICD-10-CM | POA: Diagnosis not present

## 2022-04-11 LAB — ECHOCARDIOGRAM COMPLETE
Area-P 1/2: 4.83 cm2
MV M vel: 3.52 m/s
MV Peak grad: 49.6 mmHg
S' Lateral: 3.9 cm

## 2022-05-14 ENCOUNTER — Encounter: Payer: Self-pay | Admitting: Internal Medicine

## 2022-05-15 NOTE — Telephone Encounter (Signed)
Ok for Dr Kelton Pillar - thanks

## 2022-05-31 ENCOUNTER — Other Ambulatory Visit: Payer: Self-pay

## 2022-05-31 ENCOUNTER — Encounter: Payer: Self-pay | Admitting: Internal Medicine

## 2022-06-04 ENCOUNTER — Other Ambulatory Visit: Payer: Self-pay | Admitting: Internal Medicine

## 2022-06-04 ENCOUNTER — Other Ambulatory Visit: Payer: Self-pay

## 2022-06-04 MED ORDER — TOUJEO MAX SOLOSTAR 300 UNIT/ML ~~LOC~~ SOPN: 40.0000 [IU] | PEN_INJECTOR | Freq: Every evening | SUBCUTANEOUS | 3 refills | Status: DC

## 2022-06-05 NOTE — Telephone Encounter (Signed)
Ok to contact pt - we had to change his tresiba to lantus due to insurance

## 2022-06-05 NOTE — Telephone Encounter (Signed)
Replied to pt on mychart w/MD msg with MD response on Lantus.Marland KitchenRaechel Chute

## 2022-06-05 NOTE — Telephone Encounter (Signed)
Pharmacy comment med is not covered or do a PA.Marland KitchenRaechel Chute

## 2022-06-22 ENCOUNTER — Other Ambulatory Visit: Payer: Self-pay

## 2022-06-22 MED ORDER — INSULIN GLARGINE (1 UNIT DIAL) 300 UNIT/ML ~~LOC~~ SOPN: 30.0000 [IU] | PEN_INJECTOR | Freq: Every day | SUBCUTANEOUS | 5 refills | Status: DC

## 2022-08-21 ENCOUNTER — Ambulatory Visit: Payer: BC Managed Care – PPO | Admitting: Internal Medicine

## 2022-08-22 NOTE — Progress Notes (Addendum)
I, Stevenson Clinch, CMA acting as a scribe for Clementeen Graham, MD.  William Sheppard is a 52 y.o. male who presents to [facility] at [facility] today for bilat hand pain x 8 months. Pt locates pain to bilat hands, n/t at the fingertips. Notes recently starting a new job that required regular use of power hand tools. Stiffness in the hands in the mornings. Stabbing sharp pulling pain when using the left thumb. Notes trigger 4th finger of the left hand. No hx of trigger finger prior. Was prescribed Celebrex but did not take d/t possible medication interaction with Lisinopril. Has tried IBU with no relief. LEFT hand sx worse than right, pt is LHD. Intermittent swelling in both hands  Radiates: no Paresthesia: yes Grip strength: yes Aggravates: work, gripping Treatments tried: Celebrex  Dx imaging: 10/09/17 R hand XR  09/27/17 R thumb XR  Pertinent review of systems: No fevers or chills  Relevant historical information: History of Hodgkin's lymphoma in his 54s.  This was about 25 years ago. 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  He lost his job and is currently working for [employer]  in a much more physically demanding role while he is trying to find a Neurosurgeon job.  Diabetes.  Currently pretty well-controlled.  Previously not controlled as he was off of medicine for a while.  Exam:  BP 126/82   Pulse 77   Ht 6\' 2"  (1.88 m)   Wt 217 lb (98.4 kg)   SpO2 96%   BMI 27.86 kg/m  General: Well Developed, well nourished, and in no acute distress.   MSK: Left hand: Normal appearing. Tender palpation palmar first MCP. Mildly tender palpation palmar fourth MCP with triggering present with flexion of the fourth PIP joint.  Wrist bilaterally normal appearing.  Positive Tinel's test bilateral wrist.  Negative Phalen's test bilateral wrist.  Grip strength is intact.   Lab and Radiology Results  Procedure: Real-time Ultrasound Guided Injection of left  fourth A1 pulley tendon sheath (trigger finger injection) Device: Philips Affiniti 50G/GE Logiq Images permanently stored and available for review in PACS Verbal informed consent obtained.  Discussed risks and benefits of procedure. Warned about infection, bleeding, hyperglycemia damage to structures among others. Patient expresses understanding and agreement Time-out conducted.   Noted no overlying erythema, induration, or other signs of local infection.   Skin prepped in a sterile fashion.   Local anesthesia: Topical Ethyl chloride.   With sterile technique and under real time ultrasound guidance: 40 mg of Kenalog and 1 mL of lidocaine injected into tendon sheath at A1 pulley. Fluid seen entering the tendon sheath.   Completed without difficulty   Pain immediately resolved suggesting accurate placement of the medication.   Advised to call if fevers/chills, erythema, induration, drainage, or persistent bleeding.   Images permanently stored and available for review in the ultrasound unit.  Impression: Technically successful ultrasound guided injection.  Quick look with ultrasound of the left first MCP tendon sheath does show hypoechoic fluid surrounding the tendon sheath consistent with tenosynovitis flexor tendon.          Assessment and Plan: 52 y.o. male with left hand pain thought to be trigger finger fourth digit and flexor tendinitis first digit.  Plan to treat with steroid injection fourth. Try Voltaren gel and double Band-Aid splint for the first digit.  Consider injection in the near future.  Would like to minimize some injections or doing it 1 time because  of his diabetes.  Bilateral hand paresthesias thought to be carpal tunnel syndrome.  Seems to be mild.  He is already trying some antivibration gloves at work.  I think this is a good idea.  Will also use night splints.  If not better consider injection.  Reassess in 1 month.   PDMP not reviewed this encounter. Orders  Placed This Encounter  Procedures   [place] LIMITED JOINT SPACE STRUCTURES UP LEFT(NO LINKED CHARGES)    Order Specific Question:   Reason for Exam (SYMPTOM  OR DIAGNOSIS REQUIRED)    Answer:   left 4th trigger finger    Order Specific Question:   Preferred imaging location?    Answer:   [facility]   No orders of the defined types were placed in this encounter.    Discussed warning signs or symptoms. Please see discharge instructions. Patient expresses understanding.   The above documentation has been reviewed and is accurate and complete Clementeen Graham, M.D.

## 2022-08-23 ENCOUNTER — Ambulatory Visit: Payer: BC Managed Care – PPO | Admitting: Family Medicine

## 2022-08-23 ENCOUNTER — Other Ambulatory Visit: Payer: Self-pay

## 2022-08-23 ENCOUNTER — Encounter: Payer: Self-pay | Admitting: Family Medicine

## 2022-08-23 VITALS — BP 126/82 | HR 77 | Ht 74.0 in | Wt 217.0 lb

## 2022-08-23 DIAGNOSIS — M79642 Pain in left hand: Secondary | ICD-10-CM

## 2022-08-23 DIAGNOSIS — G5603 Carpal tunnel syndrome, bilateral upper limbs: Secondary | ICD-10-CM | POA: Diagnosis not present

## 2022-08-23 DIAGNOSIS — M79641 Pain in right hand: Secondary | ICD-10-CM | POA: Diagnosis not present

## 2022-08-23 DIAGNOSIS — M6530 Trigger finger, unspecified finger: Secondary | ICD-10-CM

## 2022-08-23 DIAGNOSIS — M65342 Trigger finger, left ring finger: Secondary | ICD-10-CM

## 2022-08-23 NOTE — Patient Instructions (Addendum)
Thank you for coming in today.  You received an injection today. Seek immediate medical attention if the joint becomes red, extremely painful, or is oozing fluid.   Please use Voltaren gel (Generic Diclofenac Gel) up to 4x daily for pain as needed.  This is available over-the-counter as both the name brand Voltaren gel and the generic diclofenac gel.   Please go to [facility] supply to get the Carpal Tunnel Wrist Brace we talked about today. You may also be able to get it from [facility].    Check back in 1 month

## 2022-10-03 NOTE — Progress Notes (Deleted)
   William Payor, PhD, LAT, ATC acting as a scribe for William Graham, MD.  [name] is a 52 y.o. male who presents to [facility] at [facility] today for 6-wk f/u bilat hand pain. LHD. Pt was last seen by Dr. Denyse Sheppard on 08/23/22 and was given a L 4th trigger finger steroid injection and advised to use Voltaren gel, night splints, and double Band-aid splint.  Today, pt reports ***  Dx imaging: 10/09/17 R hand XR             09/27/17 R thumb XR  Pertinent review of systems: ***  Relevant historical information: ***   Exam:  There were no vitals taken for this visit. General: Well Developed, well nourished, and in no acute distress.   MSK: ***    Lab and Radiology Results No results found for this or any previous visit (from the past 72 hour(s)). No results found.     Assessment and Plan: 52 y.o. male with ***   PDMP not reviewed this encounter. No orders of the defined types were placed in this encounter.  No orders of the defined types were placed in this encounter.    Discussed warning signs or symptoms. Please see discharge instructions. Patient expresses understanding.   ***

## 2022-10-04 ENCOUNTER — Ambulatory Visit: Payer: BC Managed Care – PPO | Admitting: Family Medicine

## 2022-10-16 ENCOUNTER — Telehealth: Payer: Self-pay | Admitting: Internal Medicine

## 2022-10-16 NOTE — Telephone Encounter (Signed)
Refill too soon

## 2022-10-16 NOTE — Telephone Encounter (Signed)
Prescription Request  10/16/2022  LOV: 03/30/2022  What is the name of the medication or equipment?  Continuous Blood Gluc Sensor (DEXCOM G7 SENSOR) MISC  tadalafil (CIALIS) 20 MG tablet insulin glargine, 1 Unit Dial, (TOUJEO) 300 UNIT/ML Solostar Pen lisinopril (ZESTRIL) 5 MG tablet rosuvastatin (CRESTOR) 40 MG tablet  Have you contacted your pharmacy to request a refill? No   Which pharmacy would you like this sent to?  [id number] [place], [place] - [address] [place] [address] Phone: [phone] Fax: [phone]    Patient notified that their request is being sent to the clinical staff for review and that they should receive a response within 2 business days.   Please advise at Mobile [phone] (mobile)

## 2022-10-24 ENCOUNTER — Ambulatory Visit: Payer: BC Managed Care – PPO | Admitting: Internal Medicine

## 2022-12-27 ENCOUNTER — Encounter: Payer: Self-pay | Admitting: Internal Medicine

## 2022-12-28 NOTE — Telephone Encounter (Signed)
Sorry, I dont see where I have prescribed this in the past,  Perhaps he was prescribed this from another provider such as Endo?

## 2023-01-30 ENCOUNTER — Encounter: Payer: Self-pay | Admitting: Internal Medicine

## 2023-01-30 MED ORDER — INSULIN LISPRO (1 UNIT DIAL) 100 UNIT/ML (KWIKPEN): PEN_INJECTOR | SUBCUTANEOUS | 11 refills | Status: DC

## 2023-05-01 ENCOUNTER — Ambulatory Visit (INDEPENDENT_AMBULATORY_CARE_PROVIDER_SITE_OTHER): Payer: Self-pay | Admitting: Internal Medicine

## 2023-05-01 ENCOUNTER — Encounter: Payer: Self-pay | Admitting: Internal Medicine

## 2023-05-01 VITALS — BP 118/82 | HR 82 | Temp 98.7°F | Ht 74.0 in | Wt 211.0 lb

## 2023-05-01 DIAGNOSIS — E119 Type 2 diabetes mellitus without complications: Secondary | ICD-10-CM

## 2023-05-01 DIAGNOSIS — Z7985 Long-term (current) use of injectable non-insulin antidiabetic drugs: Secondary | ICD-10-CM

## 2023-05-01 DIAGNOSIS — I10 Essential (primary) hypertension: Secondary | ICD-10-CM

## 2023-05-01 LAB — POCT GLYCOSYLATED HEMOGLOBIN (HGB A1C): HbA1c POC (<> result, manual entry): 12.9 % (ref 4.0–5.6)

## 2023-05-01 MED ORDER — LISINOPRIL 5 MG PO TABS: 5.0000 mg | ORAL_TABLET | Freq: Every day | ORAL | 3 refills | Status: DC

## 2023-05-01 MED ORDER — INSULIN GLARGINE (1 UNIT DIAL) 300 UNIT/ML ~~LOC~~ SOPN
30.0000 [IU] | PEN_INJECTOR | Freq: Every day | SUBCUTANEOUS | 5 refills | Status: DC
Start: 2023-05-01 — End: 2023-05-07

## 2023-05-01 MED ORDER — INSULIN LISPRO (1 UNIT DIAL) 100 UNIT/ML (KWIKPEN): PEN_INJECTOR | SUBCUTANEOUS | 11 refills | Status: DC

## 2023-05-01 MED ORDER — ROSUVASTATIN CALCIUM 40 MG PO TABS: 40.0000 mg | ORAL_TABLET | Freq: Every day | ORAL | 3 refills | Status: DC

## 2023-05-01 MED ORDER — TADALAFIL 20 MG PO TABS: 10.0000 mg | ORAL_TABLET | ORAL | 11 refills | Status: DC | PRN

## 2023-05-01 NOTE — Patient Instructions (Signed)
 Please take all new medication as prescribed  Your A1c was done today  You will be contacted regarding the referral for: Cristy Pharmacist regarding medications  Please continue all other medications as before, and refills have been done if requested.  Please have the pharmacy call with any other refills you may need.  Please keep your appointments with your specialists as you may have planned  Please make an Appointment to return in 6 months, or sooner if needed

## 2023-05-01 NOTE — Progress Notes (Signed)
 Patient ID: William Sheppard, male   DOB: 11-Nov-1970, 53 y.o.   MRN: [id number]        Chief Complaint: follow up after starting new job without benefits start for 90 days       HPI:  William Sheppard is a 53 y.o. male here with recent isnus infection not yet resolved, and sugars have been quite high, especially as he has not been taking the lispro due to cost, states he was told at pharmacy Humalog would be less expensive and asking for this; Pt denies chest pain, increased sob or doe, wheezing, orthopnea, PND, increased LE swelling, palpitations, dizziness or syncope.   Pt denies polydipsia, polyuria, or new focal neuro s/s.    Pt denies fever, wt loss, night sweats, loss of appetite, or other constitutional symptoms         Wt Readings from Last 3 Encounters:  05/01/23 211 lb (95.7 kg)  08/23/22 217 lb (98.4 kg)  04/02/22 221 lb (100.2 kg)   BP Readings from Last 3 Encounters:  05/01/23 118/82  08/23/22 126/82  04/02/22 132/64         Past Medical History:  Diagnosis Date   AKI (acute kidney injury) (HCC) 02/04/2021   Anxiety 09/05/2021   Chest pain of uncertain etiology 05/10/2021   Chronic headaches    Cold intolerance 08/01/2020   Daytime somnolence 09/18/2012   Diabetes mellitus due to underlying condition with unspecified complications (HCC) 05/10/2021   Diabetes mellitus type II, non insulin dependent (HCC) 03/11/2015   Dizziness 07/21/2015   DKA (diabetic ketoacidosis) (HCC) 02/04/2021   Encounter for well adult exam with abnormal findings 03/08/2021   Erectile dysfunction 01/09/2020   Essential hypertension 05/09/2021   Hodgkin lymphoma, unspecified, unspecified site (HCC) 05/09/2021   Hodgkin lymphoma, unspecified, unspecified site (HCC) 05/09/2021   Hodgkin's lymphoma (HCC)    Hyperglycemia due to type 2 diabetes mellitus (HCC) 05/09/2021   Hyperlipidemia    Inadequate sleep hygiene 10/23/2012   Left hip pain 02/28/2016   Low back pain 03/08/2021   Low serum  testosterone level 11/04/2013   Male hypogonadism 11/05/2013   Mid back pain on left side 05/31/2012   Neck pain on left side 11/08/2016   Numbness 11/03/2012   Pain in right hand 10/09/2017   Pain of right thumb 09/27/2017   Pure hypercholesterolemia 05/09/2021   Right flank pain 12/08/2018   Thalassemia, alpha (HCC) 03/11/2015   Vitamin D deficiency 01/09/2020   Past Surgical History:  Procedure Laterality Date   HERNIA REPAIR     as a child   PORTACATH PLACEMENT  02/12/1997   WISDOM TOOTH EXTRACTION      reports that he has never smoked. He has never used smokeless tobacco. He reports current alcohol use. He reports that he does not use drugs. family history is not on file. He was adopted. Allergies  Allergen Reactions   Dapagliflozin     Other reaction(s): dehydration   Current Outpatient Medications on File Prior to Visit  Medication Sig Dispense Refill   aspirin 81 MG chewable tablet Chew 81 mg by mouth daily.     Continuous Blood Gluc Receiver (DEXCOM G7 RECEIVER) DEVI Use as directed four times per day E11.9 1 each 0   Continuous Blood Gluc Sensor (DEXCOM G7 SENSOR) MISC Apply 1 sensor as directed once every 2 weeks E11.9 6 each 3   Insulin Syringes, Disposable, U-100 0.3 ML MISC Insulin syringes 100 each 0   No current facility-administered medications  on file prior to visit.        ROS:  All others reviewed and negative.  Objective        PE:  BP 118/82 (BP Location: Left Arm, Patient Position: Sitting, Cuff Size: Normal)   Pulse 82   Temp 98.7 F (37.1 C) (Oral)   Ht 6\' 2"  (1.88 m)   Wt 211 lb (95.7 kg)   BMI 27.09 kg/m                 Constitutional: Pt appears in NAD               HENT: Head: NCAT.                Right Ear: External ear normal.                 Left Ear: External ear normal.                Eyes: . Pupils are equal, round, and reactive to light. Conjunctivae and EOM are normal               Nose: without d/c or deformity                Neck: Neck supple. Gross normal ROM               Cardiovascular: Normal rate and regular rhythm.                 Pulmonary/Chest: Effort normal and breath sounds without rales or wheezing.                Abd:  Soft, NT, ND, + BS, no organomegaly               Neurological: Pt is alert. At baseline orientation, motor grossly intact               Skin: Skin is warm. No rashes, no other new lesions, LE edema - none               Psychiatric: Pt behavior is normal without agitation   Micro: none  Cardiac tracings I have personally interpreted today:  none  Pertinent Radiological findings (summarize): none   Lab Results  Component Value Date   WBC 4.7 03/30/2022   HGB 13.5 03/30/2022   HCT 43.1 03/30/2022   PLT 210.0 03/30/2022   GLUCOSE 67 (L) 03/30/2022   CHOL 205 (H) 03/30/2022   TRIG 62.0 03/30/2022   HDL 58.50 03/30/2022   LDLDIRECT 82.0 08/26/2019   LDLCALC 134 (H) 03/30/2022   ALT 30 03/30/2022   AST 19 03/30/2022   NA 141 03/30/2022   K 4.2 03/30/2022   CL 105 03/30/2022   CREATININE 1.12 03/30/2022   BUN 21 03/30/2022   CO2 29 03/30/2022   TSH 0.85 03/30/2022   PSA 1.15 03/30/2022   HGBA1C 12.9 05/01/2023   MICROALBUR 9.6 (H) 03/30/2022   Assessment/Plan:  William Sheppard is a 53 y.o. Black or African American [2] male with  has a past medical history of AKI (acute kidney injury) (HCC) (02/04/2021), Anxiety (09/05/2021), Chest pain of uncertain etiology (05/10/2021), Chronic headaches, Cold intolerance (08/01/2020), Daytime somnolence (09/18/2012), Diabetes mellitus due to underlying condition with unspecified complications (HCC) (05/10/2021), Diabetes mellitus type II, non insulin dependent (HCC) (03/11/2015), Dizziness (07/21/2015), DKA (diabetic ketoacidosis) (HCC) (02/04/2021), Encounter for well adult exam with abnormal findings (03/08/2021), Erectile dysfunction (01/09/2020), Essential hypertension (05/09/2021), Hodgkin lymphoma, unspecified, unspecified  site [facility])  (05/09/2021), Hodgkin lymphoma, unspecified, unspecified site (HCC) (05/09/2021), Hodgkin's lymphoma (HCC), Hyperglycemia due to type 2 diabetes mellitus (HCC) (05/09/2021), Hyperlipidemia, Inadequate sleep hygiene (10/23/2012), Left hip pain (02/28/2016), Low back pain (03/08/2021), Low serum testosterone level (11/04/2013), Male hypogonadism (11/05/2013), Mid back pain on left side (05/31/2012), Neck pain on left side (11/08/2016), Numbness (11/03/2012), Pain in right hand (10/09/2017), Pain of right thumb (09/27/2017), Pure hypercholesterolemia (05/09/2021), Right flank pain (12/08/2018), Thalassemia, alpha (HCC) (03/11/2015), and Vitamin D deficiency (01/09/2020).  Diabetes mellitus type II, non insulin dependent (HCC) Lab Results  Component Value Date   HGBA1C 12.9 05/01/2023   Severe uncontrolled pt to continue current medical treatment toujeo 40 every day, and restart humalog SSI for better control, declines endo for now; also for refer to office pharmacist for pt med assist  Essential hypertension BP Readings from Last 3 Encounters:  05/01/23 118/82  08/23/22 126/82  04/02/22 132/64   Stable, pt to continue medical treatment lisinopril 5 qd  Followup: Return in about 6 months (around 11/01/2023).  Oliver Barre, MD 05/04/2023 3:27 PM [facility]

## 2023-05-03 ENCOUNTER — Telehealth: Payer: Self-pay | Admitting: *Deleted

## 2023-05-03 ENCOUNTER — Encounter: Payer: Self-pay | Admitting: Internal Medicine

## 2023-05-03 DIAGNOSIS — E119 Type 2 diabetes mellitus without complications: Secondary | ICD-10-CM

## 2023-05-03 NOTE — Progress Notes (Signed)
 Care Guide Pharmacy Note  05/03/2023 Name: [name] MRN: [id number] DOB: November 26, 1970  Referred By: Corwin Levins, MD Reason for referral: Care Coordination (Outreach to schedule referral with pharmacist )   William Sheppard is a 53 y.o. year old male who is a primary care patient of Corwin Levins, MD.  William Sheppard was referred to the pharmacist for assistance related to: DMII  Successful contact was made with the patient to discuss pharmacy services including being ready for the pharmacist to call at least 5 minutes before the scheduled appointment time and to have medication bottles and any blood pressure readings ready for review. The patient agreed to meet with the pharmacist via telephone visit on 05/22/2023  Burman Nieves, CMA [facility], [facility] Guide Direct Dial: [phone]  Fax: [phone] Website: [email or web address]

## 2023-05-04 ENCOUNTER — Encounter: Payer: Self-pay | Admitting: Internal Medicine

## 2023-05-04 NOTE — Assessment & Plan Note (Addendum)
 Lab Results  Component Value Date   HGBA1C 12.9 05/01/2023   Severe uncontrolled pt to continue current medical treatment toujeo 40 every day, and restart humalog SSI for better control, declines endo for now; also for refer to office pharmacist for pt med assist

## 2023-05-04 NOTE — Assessment & Plan Note (Signed)
 BP Readings from Last 3 Encounters:  05/01/23 118/82  08/23/22 126/82  04/02/22 132/64   Stable, pt to continue medical treatment lisinopril 5 qd

## 2023-05-07 ENCOUNTER — Other Ambulatory Visit: Payer: Self-pay

## 2023-05-07 MED ORDER — ROSUVASTATIN CALCIUM 40 MG PO TABS: 40.0000 mg | ORAL_TABLET | Freq: Every day | ORAL | 3 refills | Status: DC

## 2023-05-07 MED ORDER — INSULIN GLARGINE (1 UNIT DIAL) 300 UNIT/ML ~~LOC~~ SOPN: 30.0000 [IU] | PEN_INJECTOR | Freq: Every day | SUBCUTANEOUS | 5 refills | Status: DC

## 2023-05-07 MED ORDER — LISINOPRIL 5 MG PO TABS: 5.0000 mg | ORAL_TABLET | Freq: Every day | ORAL | 3 refills | Status: DC

## 2023-05-07 MED ORDER — INSULIN LISPRO (1 UNIT DIAL) 100 UNIT/ML (KWIKPEN): PEN_INJECTOR | SUBCUTANEOUS | 11 refills | Status: DC

## 2023-05-22 ENCOUNTER — Other Ambulatory Visit (HOSPITAL_COMMUNITY): Payer: Self-pay

## 2023-05-22 ENCOUNTER — Other Ambulatory Visit: Payer: Self-pay

## 2023-05-22 ENCOUNTER — Other Ambulatory Visit (INDEPENDENT_AMBULATORY_CARE_PROVIDER_SITE_OTHER): Admitting: Pharmacist

## 2023-05-22 DIAGNOSIS — E1165 Type 2 diabetes mellitus with hyperglycemia: Secondary | ICD-10-CM

## 2023-05-22 DIAGNOSIS — E119 Type 2 diabetes mellitus without complications: Secondary | ICD-10-CM

## 2023-05-22 DIAGNOSIS — E782 Mixed hyperlipidemia: Secondary | ICD-10-CM

## 2023-05-22 MED ORDER — ROSUVASTATIN CALCIUM 40 MG PO TABS
40.0000 mg | ORAL_TABLET | Freq: Every day | ORAL | 11 refills | Status: DC
  Filled 2023-05-22 – 2023-06-13 (×3): qty 30, 30d supply, fill #0

## 2023-05-22 MED ORDER — INSULIN GLARGINE (1 UNIT DIAL) 300 UNIT/ML ~~LOC~~ SOPN
PEN_INJECTOR | SUBCUTANEOUS | 5 refills | Status: DC
  Filled 2023-05-22 – 2023-06-13 (×2): qty 15, 30d supply, fill #0
  Filled 2023-06-13: qty 15, 90d supply, fill #0
  Filled 2023-06-13: qty 4.5, 27d supply, fill #0

## 2023-05-22 MED ORDER — INSULIN LISPRO (1 UNIT DIAL) 100 UNIT/ML (KWIKPEN)
PEN_INJECTOR | SUBCUTANEOUS | 11 refills | Status: DC
  Filled 2023-05-22: qty 15, 25d supply, fill #0

## 2023-05-22 NOTE — Progress Notes (Signed)
 05/22/2023 Name: William Sheppard MRN: [id number] DOB: 23-Feb-1970  Chief Complaint  Patient presents with   Diabetes   Medication Management   Medication Access    William Sheppard is a 53 y.o. year old male who presented for a telephone visit.   They were referred to the pharmacist by their PCP for assistance in managing diabetes and medication access.   No insurance currently Toujeo 40 units and Humalog - if BG 400 will take 20 units, will go down to 190-200 Out of Toujeo - $200 for 2 pens  Lisinopril (currently out), rosuvastatin but taking pravastatin because ran out of rosuvastatin  [facility]uvastatin - [facility]   Toujeo 50 units day  Subjective:  Care Team: Primary Care Provider: Corwin Levins, MD ; Next Scheduled Visit: 11/01/2023   Medication Access/Adherence  Current Pharmacy:  [id number] - [address]), [place] - [address] [place]) [place] [address] Phone: [phone] Fax: [phone]  [id number] [place], [place] - [address] [place] [address] Phone: [phone] Fax: [phone]  [id number] [place] - [address] [place] [address] Phone: [phone] Fax: [phone]  [facility] [address] [place] [address] Phone: [phone] Fax: [phone]   Patient reports affordability concerns with their medications: Yes  Patient reports access/transportation concerns to their pharmacy: No  Patient reports adherence concerns with their medications:  Yes    Pt is currently without insurance due to change in work. He will have insurance in 3 months   Diabetes:  Current medications: Humalog sliding scale - will take 20 units if BG is 400 or higher Medications tried in the past: [place]  He is out of Toujeo and has been unable to afford it at  [employer]. Currently has 3 or 4 pens of insulin lispro remaining Had used Dexcom but unable to afford currently  Current glucose readings: Using fingerstick - 3-4x per day. Sometimes up to 400, fasting 170-217   Current meal patterns: he notes he is having difficulty with restricting and then binging   Lipids: Currently taking pravatatin - he ran out of rosuvastatin so started taking pravastatin that he had left over from a previous Rx    Objective:  Lab Results  Component Value Date   HGBA1C 12.9 05/01/2023    Lab Results  Component Value Date   CREATININE 1.12 03/30/2022   BUN 21 03/30/2022   NA 141 03/30/2022   K 4.2 03/30/2022   CL 105 03/30/2022   CO2 29 03/30/2022    Lab Results  Component Value Date   CHOL 205 (H) 03/30/2022   HDL 58.50 03/30/2022   LDLCALC 134 (H) 03/30/2022   LDLDIRECT 82.0 08/26/2019   TRIG 62.0 03/30/2022   CHOLHDL 4 03/30/2022    Medications Reviewed Today     Reviewed by Bonita Quin, RPH (Pharmacist) on 05/22/23 at 1133  Med List Status: <None>   Medication Order Taking? Sig Documenting Provider Last Dose Status Informant  aspirin 81 MG chewable tablet [id number] Yes Chew 81 mg by mouth daily. [provider] Taking Active   Continuous Blood Gluc Receiver [facility] G7 RECEIVER) DEVI [id number] No Use as directed four times per day E11.9  Patient not taking: Reported on 05/22/2023   Corwin Levins, MD Not Taking Active   Continuous Blood Gluc  Sensor (DEXCOM G7 SENSOR) [place] [id number] No Apply 1 sensor as directed once every 2 weeks E11.9  Patient not taking: Reported on 05/22/2023   Corwin Levins, MD Not Taking Active   insulin glargine, 1 Unit Dial, (TOUJEO) 300 UNIT/ML Solostar Pen [id number] No Take 50 units subcutaneous once daily. Start at 40 units weekly, increase by 5 units every week if fasting blood sugars remain above 150, until you reach 50 units daily  Patient not taking: Reported on 05/22/2023   Corwin Levins, MD  Not Taking Active   insulin lispro (HUMALOG) 100 UNIT/ML KwikPen [id number] Yes Inject 10 units prior to each meal. Increase to 15 units if BG >200 prior to meal. Increase to 20 units if BG is >300 prior to meal. Corwin Levins, MD Taking Active   Insulin Syringes, Disposable, U-100 0.3 ML MISC [id number]  Insulin syringes Corwin Levins, MD  Active   lisinopril (ZESTRIL) 5 MG tablet [id number] No Take 1 tablet (5 mg total) by mouth daily.  Patient not taking: Reported on 05/22/2023   Corwin Levins, MD Not Taking Active   rosuvastatin (CRESTOR) 40 MG tablet [id number] No Take 1 tablet (40 mg total) by mouth daily.  Patient not taking: Reported on 05/22/2023   Corwin Levins, MD Not Taking Active   tadalafil (CIALIS) 20 MG tablet [id number]  Take 0.5-1 tablets (10-20 mg total) by mouth every other day as needed for erectile dysfunction. Corwin Levins, MD  Active               Assessment/Plan:   Diabetes: - Currently uncontrolled, A1c <7% - Reviewed goal A1c, goal fasting, and goal 2 hour post prandial glucose - Does not qualify for [facility] due to income - Can get Toujeo and Humalog both with coupons for $35 per month supply - Sent Toujeo to take 40 units every day, increasing by 5 units every week if fasting blood sugar remains above 150 - Sent Humalog - recommended to take 10 units prior to each meal, increase to 15 units if BG >200 prior to eating, to 20 units if BG >300 prior to eating - F/u in 3 weeks - plan to discuss diet/exercise and adjust insulin if needed   Hyperlipidemia/ASCVD Risk Reduction: - Currently uncontrolled. LDL goal <70 - Recommend to restart rosuvastatin- can get for $10 per 30 DS at [facility] outpatient pharmacy     Follow Up Plan: 4/29  Arbutus Leas, PharmD, BCPS, CPP Clinical Pharmacist Practitioner [facility] at [facility] [phone]

## 2023-06-01 ENCOUNTER — Other Ambulatory Visit (HOSPITAL_COMMUNITY): Payer: Self-pay

## 2023-06-11 ENCOUNTER — Other Ambulatory Visit (INDEPENDENT_AMBULATORY_CARE_PROVIDER_SITE_OTHER): Payer: Self-pay | Admitting: Pharmacist

## 2023-06-11 DIAGNOSIS — Z794 Long term (current) use of insulin: Secondary | ICD-10-CM

## 2023-06-11 DIAGNOSIS — E1165 Type 2 diabetes mellitus with hyperglycemia: Secondary | ICD-10-CM

## 2023-06-11 NOTE — Patient Instructions (Signed)
 It was a pleasure speaking with you today!  Restart Toujeo  - can get from [facility] for $35  Restart atorvastatin - can get from [facility] for $10  Continue monitoring blood sugars. I will call back in 2 weeks or call me if you are having issues getting your insulins/monitor once your new insurance starts.  Feel free to call with any questions or concerns!  Rainelle Bur, PharmD, BCPS, CPP Clinical Pharmacist Practitioner [facility] at [facility] [phone]

## 2023-06-11 NOTE — Progress Notes (Signed)
 06/11/2023 Name: William Sheppard MRN: [id number] DOB: Nov 21, 1970  Chief Complaint  Patient presents with   Diabetes   Medication Management    William Sheppard is a 53 y.o. year old male who presented for a telephone visit.   They were referred to the pharmacist by their PCP for assistance in managing diabetes and medication access.    Subjective:  Care Team: Primary Care Provider: Roslyn Coombe, MD ; Next Scheduled Visit: 11/01/2023   Medication Access/Adherence  Current Pharmacy:  [facility] [address] (SE), [place] - [address] [place] (SE) [place] [address] Phone: [phone] Fax: [phone]  [facility] [id number] [place], [place] - [address] [place] [address] Phone: [phone] Fax: [phone]  [id number] [place] - [address] [facility] [address] [place] [address] Phone: [phone] Fax: [phone]  Melodee Spruce LONG - [facility] [address] [place] [address] Phone: [phone] Fax: [phone]   Patient reports affordability concerns with their medications: Yes  Patient reports access/transportation concerns to their pharmacy: No  Patient reports adherence concerns with their medications:  Yes    Pt is currently without insurance due to change in work. He is expecting to get insurance on 5/1   Diabetes:  Current medications: Humalog  sliding scale - will take 20 units if BG is 400 or higher Medications tried in the past: Farxiga   He is out of Toujeo  and has been unable to afford it at cash price - was not able ot get to the pharmacy since last appt to pick up Toujeo  and Humalog  as discounted price ($35 with coupon) Had used Dexcom but unable to afford currently  Current glucose readings: BG in the 120-130s (checking 4-5x per day by fingerstick) before eating. Notes it will go up to 400 after eating at  times   Current lifestyle: Has been eating better/lighter, and more active (walking 10 miles per day)   Lipids: Currently taking pravatatin - he ran out of rosuvastatin  so started taking pravastatin  that he had left over from a previous Rx    Objective:  Lab Results  Component Value Date   HGBA1C 12.9 05/01/2023    Lab Results  Component Value Date   CREATININE 1.12 03/30/2022   BUN 21 03/30/2022   NA 141 03/30/2022   K 4.2 03/30/2022   CL 105 03/30/2022   CO2 29 03/30/2022    Lab Results  Component Value Date   CHOL 205 (H) 03/30/2022   HDL 58.50 03/30/2022   LDLCALC 134 (H) 03/30/2022   LDLDIRECT 82.0 08/26/2019   TRIG 62.0 03/30/2022   CHOLHDL 4 03/30/2022    Medications Reviewed Today   Medications were not reviewed in this encounter       Assessment/Plan:   Diabetes: - Currently uncontrolled, A1c <7% - Reviewed goal A1c, goal fasting, and goal 2 hour post prandial glucose - Does not qualify for [facility] due to income - Can get Toujeo  and Humalog  both with coupons for $35 per month supply - Sent Toujeo  to take 40 units every day, increasing by 5 units every week if fasting blood sugar remains above 150 - Sent Humalog  - recommended to take 10 units prior to each meal, increase to 15 units if BG >200 prior to eating, to 20 units if BG >300 prior to eating - Encouraged to restart Toujeo , call pharmacy to have them fill  it suing the $35 coupon - Will follow back up after insurance coverage to ensure he can get back on CGM   Hyperlipidemia/ASCVD Risk Reduction: - Currently uncontrolled. LDL goal <70 - Recommend to restart rosuvastatin - can get for $10 per 30 DS at [facility] outpatient pharmacy     Follow Up Plan: 5/6  Rainelle Bur, PharmD, BCPS, CPP Clinical Pharmacist Practitioner [place] [facility] [phone]

## 2023-06-13 ENCOUNTER — Other Ambulatory Visit (HOSPITAL_COMMUNITY): Payer: Self-pay

## 2023-06-19 ENCOUNTER — Other Ambulatory Visit (HOSPITAL_COMMUNITY): Payer: Self-pay

## 2023-06-25 ENCOUNTER — Other Ambulatory Visit (INDEPENDENT_AMBULATORY_CARE_PROVIDER_SITE_OTHER): Payer: Self-pay | Admitting: Pharmacist

## 2023-06-25 DIAGNOSIS — E119 Type 2 diabetes mellitus without complications: Secondary | ICD-10-CM

## 2023-06-25 MED ORDER — INSULIN LISPRO (1 UNIT DIAL) 100 UNIT/ML (KWIKPEN): PEN_INJECTOR | SUBCUTANEOUS | Status: DC

## 2023-06-25 NOTE — Patient Instructions (Addendum)
 It was a pleasure speaking with you today!  Adjust Humalog  to 5 units prior to breakfast. Continue Humalog  sliding scale prior to lunch and supper: 10 units if blood sugar < 200, 15 units if blood sugar > 200, or 20 units if blood sugar > 300. Work on giving Humalog  prior to meals, rather than after. Continue Toujeo  40 units daily.  Feel free to call the office with any questions or concerns!  Abelina Abide, PharmD PGY1 Pharmacy Resident 06/25/2023 11:59 AM  Rainelle Bur, PharmD, BCPS, CPP Clinical Pharmacist Practitioner [place] [facility] [phone]

## 2023-06-25 NOTE — Progress Notes (Unsigned)
 06/25/2023 Name: William Sheppard MRN: [id number] DOB: 1970-03-09  Chief Complaint  Patient presents with   Diabetes   Medication Management    William Sheppard is a 53 y.o. year old male who presented for a telephone visit.   They were referred to the pharmacist by their PCP for assistance in managing diabetes and medication access.   Subjective:  Care Team: Primary Care Provider: Roslyn Coombe, MD ; Next Scheduled Visit: 11/01/2023  Medication Access/Adherence  Current Pharmacy:  [facility] [address] (SE), [place] - [address] [place] (SE) [place] [address] Phone: [phone] Fax: [phone]  [facility] [id number] [place], [place] - [address] [place] [address] Phone: [phone] Fax: [phone]  [id number] [place] - [address] [facility] [address] [place] [address] Phone: [phone] Fax: [phone]  Melodee Spruce LONG - [facility] [address] [place] [address] Phone: [phone] Fax: [phone]   Patient reports affordability concerns with their medications: Yes  Patient reports access/transportation concerns to their pharmacy: No  Patient reports adherence concerns with their medications:  Yes    Currently without good insurance coverage due to change in work. Now has insurance from a private carrier, but patient reports it does not cover anything and actually makes his medications more expensive. Waiting to go through hiring process at new job and then in 1-2 months will get employer coverage.  Diabetes: Using $35 copay cards for insulin  Current medications: Toujeo  40 units daily, Humalog  sliding scale - 10 units if BG < 200, 15 units if BG > 200, and 20 units if BG > 300 - Always takes Humalog  with breakfast (if eats) and supper, but does not usually take with lunch as he has a very active job and is more concerned about  lower BGs - Most often uses 10-15 units of Humalog  before meals - Sometimes will check BG after eat and then give Humalog  reactively  Patient states he feels the Humalog  takes a while to work - around 1.5hrs. Thinks the vials worked better and faster (in 15-20 min) but the pens are more convenient.  Medications tried in the past: Farxiga   Had used Dexcom but unable to afford currently  Current glucose readings: checking 4-5x per day by fingerstick - in AM, then throughout the day when he thinks about it or when feels out of the norm AM - used to be 260 but after restarting Toujeo , now at 140-170 After he eats - might jump up to 200-230 Lowest is around 100 and highest after he eats  Patient reports sx of hypoglycemia - sweating, worsened mood - but has not had his meter to check the BG. States his BG has been around 80 when he has felt this way in the past. Does not occur everyday but if it does, it's in the AM at work when he hasn't eaten a snack.  Current lifestyle: Has been eating better/lighter, and more active (walking 10 miles per day at job & frequently lifting heavy objects)  Meals: Sometimes breakfast but definitely eats lunch and dinner with snacks in between  Lipids:  Current Medication: rosuvastatin  40 mg daily Was able to pick up from pharmacy after last PharmD visit. Confirmed patient has stopped taking pravastatin .  Objective:  Lab Results  Component Value Date   HGBA1C 12.9 05/01/2023    Lab Results  Component Value Date  CREATININE 1.12 03/30/2022   BUN 21 03/30/2022   NA 141 03/30/2022   K 4.2 03/30/2022   CL 105 03/30/2022   CO2 29 03/30/2022    Lab Results  Component Value Date   CHOL 205 (H) 03/30/2022   HDL 58.50 03/30/2022   LDLCALC 134 (H) 03/30/2022   LDLDIRECT 82.0 08/26/2019   TRIG 62.0 03/30/2022   CHOLHDL 4 03/30/2022    Medications Reviewed Today     Reviewed by William Sheppard, RPH (Pharmacist) on 06/25/23 at 1108  Med List  Status: <None>   Medication Order Taking? Sig Documenting Provider Last Dose Status Informant  aspirin  81 MG chewable tablet [id number]  Chew 81 mg by mouth daily. [provider]  Active   Continuous Blood Gluc Receiver (DEXCOM G7 [place]) DEVI [id number] No Use as directed four times per day E11.9  Patient not taking: Reported on 06/25/2023   William Coombe, MD Not Taking Active   Continuous Blood Gluc Sensor (DEXCOM G7 SENSOR) [place] [id number] No Apply 1 sensor as directed once every 2 weeks E11.9  Patient not taking: Reported on 06/25/2023   William Coombe, MD Not Taking Active   insulin  glargine, 1 Unit Dial , (TOUJEO ) 300 UNIT/ML Solostar Pen [id number] Yes Start at 40 units, increase by 5 units every week if fasting blood sugars remain above 150, until you reach 50 units daily. William Coombe, MD Taking Active   insulin  lispro (HUMALOG ) 100 UNIT/ML KwikPen [id number] Yes Inject 10 units prior to each meal. Increase to 15 units if BG >200 prior to meal. Increase to 20 units if BG is >300 prior to meal. William Coombe, MD Taking Active   Insulin  Syringes, Disposable, U-100 0.3 ML MISC [id number]  Insulin  syringes William Coombe, MD  Active   lisinopril  (ZESTRIL ) 5 MG tablet [id number] Yes Take 1 tablet (5 mg total) by mouth daily. William Coombe, MD Taking Active   rosuvastatin  (CRESTOR ) 40 MG tablet [id number] Yes Take 1 tablet (40 mg total) by mouth daily. William Coombe, MD Taking Active   tadalafil  (CIALIS ) 20 MG tablet [id number]  Take 0.5-1 tablets (10-20 mg total) by mouth every other day as needed for erectile dysfunction. William Coombe, MD  Active             Assessment/Plan:   Diabetes: - Currently uncontrolled, A1c <7% - Reviewed goal A1c, goal fasting, and goal 2 hour post prandial glucose - Does not qualify for [facility] due to income - Continues to get Toujeo  and Humalog  both with coupons for $35 per month supply - Continue Toujeo  40 units daily - Adjust Humalog  to  5 units with breakfast to try to minimize hypoglycemia in the AM at work. Continue utilizing sliding scale for lunch and supper: 10 units prior to each meal if BG < 200, 15 units if BG >200 prior to eating, 20 units if BG >300 prior to eating - Educated patient on role and mechanism of Humalog  as he noted concern about the pens not bringing his BG down as quickly. Informed patient the intent of the Humalog  is to minimize BG spikes after eating, so we do not expect it to drop BG if administered prior to a meal. Patient voiced understanding. - Anticipate restarting CGM once obtains employer coverage - Plan for next A1c in June  Hyperlipidemia/ASCVD Risk Reduction: - Currently uncontrolled. LDL goal <70 - Continue rosuvastatin  40 mg daily - Plan for lipid panel in June  Follow  Up Plan: 6/16 in-person office visit  Abelina Abide, PharmD PGY1 Pharmacy Resident 06/25/2023 11:55 AM  Rainelle Bur, PharmD, BCPS, CPP Clinical Pharmacist Practitioner [place] [facility] [phone]

## 2023-08-01 ENCOUNTER — Ambulatory Visit: Payer: Self-pay

## 2023-08-09 ENCOUNTER — Ambulatory Visit: Payer: Self-pay | Admitting: Pharmacist

## 2023-08-09 ENCOUNTER — Ambulatory Visit (INDEPENDENT_AMBULATORY_CARE_PROVIDER_SITE_OTHER): Payer: Self-pay | Admitting: Pharmacist

## 2023-08-09 ENCOUNTER — Other Ambulatory Visit (INDEPENDENT_AMBULATORY_CARE_PROVIDER_SITE_OTHER)

## 2023-08-09 DIAGNOSIS — E1165 Type 2 diabetes mellitus with hyperglycemia: Secondary | ICD-10-CM

## 2023-08-09 DIAGNOSIS — Z794 Long term (current) use of insulin: Secondary | ICD-10-CM

## 2023-08-09 DIAGNOSIS — E782 Mixed hyperlipidemia: Secondary | ICD-10-CM

## 2023-08-09 DIAGNOSIS — E119 Type 2 diabetes mellitus without complications: Secondary | ICD-10-CM

## 2023-08-09 LAB — HEMOGLOBIN A1C: Hgb A1c MFr Bld: 11 % — ABNORMAL HIGH (ref 4.6–6.5)

## 2023-08-09 MED ORDER — LISINOPRIL 5 MG PO TABS: 5.0000 mg | ORAL_TABLET | Freq: Every day | ORAL | 0 refills | Status: AC

## 2023-08-09 MED ORDER — ROSUVASTATIN CALCIUM 40 MG PO TABS: 40.0000 mg | ORAL_TABLET | Freq: Every day | ORAL | 0 refills | Status: AC

## 2023-08-09 MED ORDER — INSULIN LISPRO (1 UNIT DIAL) 100 UNIT/ML (KWIKPEN): PEN_INJECTOR | SUBCUTANEOUS | 2 refills | Status: DC

## 2023-08-09 MED ORDER — PEN NEEDLES 32G X 6 MM MISC: 1 refills | Status: AC

## 2023-08-09 MED ORDER — INSULIN GLARGINE (1 UNIT DIAL) 300 UNIT/ML ~~LOC~~ SOPN: PEN_INJECTOR | SUBCUTANEOUS | 5 refills | Status: DC

## 2023-08-09 MED ORDER — DEXCOM G7 SENSOR MISC: 1 refills | Status: AC

## 2023-08-09 NOTE — Progress Notes (Signed)
 08/09/2023 Name: William Sheppard MRN: [id number] DOB: December 26, 1970  Chief Complaint  Patient presents with   Diabetes   Medication Management    William Sheppard is a 53 y.o. year old male who presented for a telephone visit.   They were referred to the pharmacist by their PCP for assistance in managing diabetes and medication access.   Subjective:  Care Team: Primary Care Provider: Norleen Lynwood ORN, MD ; Next Scheduled Visit: 11/01/2023  Medication Access/Adherence  Current Pharmacy:  [facility] [address] (SE), [place] - [address] [place] (SE) [place] [address] Phone: [phone] Fax: [phone]  [facility] [id number] [place], [place] - [address] [place] [address] Phone: [phone] Fax: [phone]  [id number] [place] - [address] [facility] [address] [place] [address] Phone: [phone] Fax: [phone]  DARRYLE LONG - [facility] [address] [place] [address] Phone: [phone] Fax: [phone]   Patient reports affordability concerns with their medications: Yes  Patient reports access/transportation concerns to their pharmacy: No  Patient reports adherence concerns with their medications:  Yes    Now has insurance through work  Diabetes: Using $35 copay cards for insulin  Current medications: Lantus  10 units daily (was on Toujeo  40 units daily but ran out), Humalog  sliding scale - 10 units if BG < 200, 15 units if BG > 200, and 20 units if BG > 300 - Always takes Humalog  with breakfast (if eats) and supper, but does not usually take with lunch as he has a very active job and is more concerned about lower BGs - Most often uses 10-15 units of Humalog  before meals - Sometimes will check BG after eat and then give Humalog  reactively  Medications tried in the past: Farxiga   Had used Dexcom but unable to afford  currently  Current glucose readings: checking 4-5x per day by fingerstick - in AM, then throughout the day when he thinks about it or when feels out of the norm AM - 150-210 After he eats - might jump up to 200-230, has been as low as 100   Patient reports sx of hypoglycemia - sweating, worsened mood - but has not had his meter to check the BG. States his BG has been around 80 when he has felt this way in the past. Does not occur everyday but if it does, it's in the AM at work when he hasn't eaten a snack.  Current lifestyle: Has been eating better/lighter, and more active (walking 10 miles per day at job & frequently lifting heavy objects)  Meals: Sometimes breakfast but definitely eats lunch and dinner with snacks in between  Lipids:  Current Medication: rosuvastatin  40 mg daily   Objective:  Lab Results  Component Value Date   HGBA1C 11.0 (H) 08/09/2023    Lab Results  Component Value Date   CREATININE 1.12 03/30/2022   BUN 21 03/30/2022   NA 141 03/30/2022   K 4.2 03/30/2022   CL 105 03/30/2022   CO2 29 03/30/2022    Lab Results  Component Value Date   CHOL 205 (H) 03/30/2022   HDL 58.50 03/30/2022   LDLCALC 134 (H) 03/30/2022   LDLDIRECT 82.0 08/26/2019   TRIG 62.0 03/30/2022   CHOLHDL 4 03/30/2022    Medications Reviewed Today   Medications were not reviewed in this encounter     Assessment/Plan:   Diabetes: - Currently  uncontrolled, A1c <7% - Reviewed goal A1c, goal fasting, and goal 2 hour post prandial glucose - Sent all Rxs to pharmacy to get through new insurance. Advised patient to contact me if meds are expensive or pharmacy says they are denied - Restart Toujeo  40 units daily, Humalog  sliding scale - Sent Rx for Dexcom sensors - A1c today  Hyperlipidemia/ASCVD Risk Reduction: - Currently uncontrolled. LDL goal <70 - Continue rosuvastatin  40 mg daily - sent refill   Follow Up Plan: 7/15  Darrelyn Drum, PharmD, BCPS, CPP Clinical  Pharmacist Practitioner [place] [facility] [phone]

## 2023-08-21 ENCOUNTER — Telehealth: Payer: Self-pay | Admitting: Pharmacist

## 2023-08-21 NOTE — Telephone Encounter (Signed)
 [facility] price of Dexcom G7 sensors. With insurance, 90 DS of the sensors is $336. 30 DS is $113.  [facility] was able to [facility] a test claim for the Freestyle Libre 3 Plus sensors which were cheaper at $75 for 30 DS.  Called patient to discuss, however no answer. Left voicemail with direct call back number.  Darrelyn Drum, PharmD, BCPS, CPP Clinical Pharmacist Practitioner [place] [facility] [phone]

## 2023-08-27 ENCOUNTER — Other Ambulatory Visit (INDEPENDENT_AMBULATORY_CARE_PROVIDER_SITE_OTHER): Admitting: Pharmacist

## 2023-08-27 DIAGNOSIS — Z794 Long term (current) use of insulin: Secondary | ICD-10-CM

## 2023-08-27 DIAGNOSIS — E1165 Type 2 diabetes mellitus with hyperglycemia: Secondary | ICD-10-CM

## 2023-08-27 NOTE — Progress Notes (Signed)
 08/27/2023 Name: William Sheppard MRN: [id number] DOB: 07/02/1970  Chief Complaint  Patient presents with   Diabetes   Medication Management    William Sheppard is a 53 y.o. year old male who presented for a telephone visit.   They were referred to the pharmacist by their PCP for assistance in managing diabetes and medication access.   Subjective:  Care Team: Primary Care Provider: Norleen Lynwood ORN, MD ; Next Scheduled Visit: 11/01/2023  Medication Access/Adherence  Current Pharmacy:  [facility] [address] (SE), [place] - [address] [place] (SE) [place] [address] Phone: [phone] Fax: [phone]  [facility] [id number] [place], [place] - [address] [place] [address] Phone: [phone] Fax: [phone]  [id number] [place] - [address] [facility] [address] [place] [address] Phone: [phone] Fax: [phone]  DARRYLE LONG - [facility] [address] [place] [address] Phone: [phone] Fax: [phone]   Patient reports affordability concerns with their medications: Yes  Patient reports access/transportation concerns to their pharmacy: No  Patient reports adherence concerns with their medications:  Yes    Now has insurance through work  Diabetes: Current medications: Lantus  40 units daily (using until Lantus  runs out, then will get Toujeo ), Humalog  - taking 8 units prior to meals unless BG >250 then uses 13-15 units (sometimes forgets to take)   - Always takes Humalog  with breakfast (if eats) and supper, but does not usually take with lunch as he has a very active job and is more concerned about lower BGs   Medications tried in the past: Farxiga  (dehydration requiring hospitalization), metformin (not tolerated)   Current glucose readings:  Now using Dexom G7 - recently started 4 days ago     Patient reports sx of  hypoglycemia - sweating, worsened mood - but has not had his meter to check the BG. States his BG has been around 80 when he has felt this way in the past. Does not occur everyday but if it does, it's in the AM at work when he hasn't eaten a snack.  Current lifestyle: Has been eating better/lighter, and more active (walking 10 miles per day at job & frequently lifting heavy objects)  Meals: Sometimes breakfast but definitely eats lunch and dinner with snacks in between  Lipids:  Current Medication: rosuvastatin  40 mg daily   Objective:  Lab Results  Component Value Date   HGBA1C 11.0 (H) 08/09/2023    Lab Results  Component Value Date   CREATININE 1.12 03/30/2022   BUN 21 03/30/2022   NA 141 03/30/2022   K 4.2 03/30/2022   CL 105 03/30/2022   CO2 29 03/30/2022    Lab Results  Component Value Date   CHOL 205 (H) 03/30/2022   HDL 58.50 03/30/2022   LDLCALC 134 (H) 03/30/2022   LDLDIRECT 82.0 08/26/2019   TRIG 62.0 03/30/2022   CHOLHDL 4 03/30/2022    Medications Reviewed Today     Reviewed by Merceda Lela SAUNDERS, RPH (Pharmacist) on 08/27/23 at 1352  Med List Status: <None>   Medication Order Taking? Sig Documenting Provider Last Dose Status Informant  aspirin  81 MG chewable tablet [id number]  Chew 81 mg by mouth daily. [provider]  Active   Continuous Blood Gluc Receiver WILMOT G7 [place]) DEVI [id number] Yes Use as directed four times per day E11.9 Norleen Lynwood ORN, MD  Active   Continuous Glucose  Sensor (DEXCOM G7 SENSOR) [place] [id number]  Apply 1 sensor as directed once every 10 days E11.9 Norleen Lynwood ORN, MD  Active   insulin  glargine, 1 Unit Dial , (TOUJEO ) 300 UNIT/ML Solostar Pen [id number] Yes Start at 40 units, increase by 5 units every week if fasting blood sugars remain above 150, until you reach 50 units daily. Norleen Lynwood ORN, MD  Active   insulin  lispro (HUMALOG ) 100 UNIT/ML KwikPen [id number] Yes Inject 5 units prior to breakfast. For lunch and supper:  inject 10 units if BG < 200 prior to meal, 15 units if BG >200 prior to meal, or 20 units if BG is >300 prior to meal. Norleen Lynwood ORN, MD  Active   Insulin  Pen Needle (PEN NEEDLES) 32G X 6 MM MISC [id number]  To use with insulin  injections up to 4x per day Norleen Lynwood ORN, MD  Active   Insulin  Syringes, Disposable, U-100 0.3 ML MISC [id number]  Insulin  syringes Norleen Lynwood ORN, MD  Active   lisinopril  (ZESTRIL ) 5 MG tablet [id number]  Take 1 tablet (5 mg total) by mouth daily. Norleen Lynwood ORN, MD  Active   rosuvastatin  (CRESTOR ) 40 MG tablet [id number]  Take 1 tablet (40 mg total) by mouth daily. Norleen Lynwood ORN, MD  Active   tadalafil  (CIALIS ) 20 MG tablet [id number]  Take 0.5-1 tablets (10-20 mg total) by mouth every other day as needed for erectile dysfunction. Norleen Lynwood ORN, MD  Active             Assessment/Plan:   Diabetes: - Currently uncontrolled, A1c <7% - Reviewed goal A1c, goal fasting, and goal 2 hour post prandial glucose - Reviewed dietary modifications including increased protein and fiber - Reviewed lifestyle modifications including strength training - Continue Toujeo  40 units daily, Humalog  sliding scale - Will need insulin  adjustments, however waiting for more Dexcom data. Focus on taking Humalog  consistently, especially prior to largest meal  Hyperlipidemia/ASCVD Risk Reduction: - Currently uncontrolled. LDL goal <70 - Continue rosuvastatin  40 mg daily   Follow Up Plan: 7/28  Darrelyn Drum, PharmD, BCPS, CPP Clinical Pharmacist Practitioner [place] [facility] [phone]

## 2023-09-09 ENCOUNTER — Other Ambulatory Visit (INDEPENDENT_AMBULATORY_CARE_PROVIDER_SITE_OTHER): Admitting: Pharmacist

## 2023-09-09 DIAGNOSIS — E1165 Type 2 diabetes mellitus with hyperglycemia: Secondary | ICD-10-CM

## 2023-09-09 DIAGNOSIS — Z794 Long term (current) use of insulin: Secondary | ICD-10-CM

## 2023-09-09 NOTE — Patient Instructions (Signed)
 It was a pleasure speaking with you today!  - Increase Toujeo  to 45 units daily, Continue Humalog  sliding scale - Focus on taking Humalog  consistently, especially prior to largest meal. Avoid taking Humalog  without food.  Feel free to call with any questions or concerns!  Darrelyn Drum, PharmD, BCPS, CPP Clinical Pharmacist Practitioner [place] [facility] [phone]

## 2023-09-09 NOTE — Progress Notes (Signed)
 09/09/2023 Name: William Sheppard MRN: [id number] DOB: 04/05/1970  Chief Complaint  Patient presents with   Diabetes   Medication Management    William Sheppard is a 53 y.o. year old male who presented for a telephone visit.   They were referred to the pharmacist by their PCP for assistance in managing diabetes and medication access.   Subjective:  Care Team: Primary Care Provider: Norleen Lynwood ORN, MD ; Next Scheduled Visit: 11/01/2023  Medication Access/Adherence  Current Pharmacy:  [facility] [address] ([address]), [place] - [address] [place] ([place]) [place] [address] Phone: [phone] Fax: [phone]  [facility] [id number] [place], [place] - [address] [place] [address] Phone: [phone] Fax: [phone]  [id number] [place] - [address] [place] [address] Phone: [phone] Fax: [phone]  DARRYLE LONG - [facility] [address] [place] [address] Phone: [phone] Fax: [phone]   Patient reports affordability concerns with their medications: Yes  Patient reports access/transportation concerns to their pharmacy: No  Patient reports adherence concerns with their medications:  Yes    Now has insurance through work  Diabetes: Current medications: Lantus  40 units daily (using until Lantus  runs out, then will get Toujeo ), Humalog  - taking 8 units prior to meals unless BG >250 then uses 13-15 units (sometimes forgets to take)   - Always takes Humalog  with breakfast (if eats) and supper, but does not usually take with lunch as he has a very active job and is more concerned about lower BGs   Medications tried in the past: Farxiga  (dehydration requiring hospitalization), metformin (not tolerated)   Current glucose readings:  Now using Dexom G7 - recently started using. Last sensor fell off early.     Notes  one night BG went down to 70s otherwise has often been high. Has been inconsistent with timing of Humalog  and sometimes takes without food.  Current lifestyle: Has been eating better/lighter, and more active (walking 10 miles per day at job & frequently lifting heavy objects)  Meals: Sometimes breakfast but definitely eats lunch and dinner with snacks in between  Lipids:  Current Medication: rosuvastatin  40 mg daily   Objective:  Lab Results  Component Value Date   HGBA1C 11.0 (H) 08/09/2023    Lab Results  Component Value Date   CREATININE 1.12 03/30/2022   BUN 21 03/30/2022   NA 141 03/30/2022   K 4.2 03/30/2022   CL 105 03/30/2022   CO2 29 03/30/2022    Lab Results  Component Value Date   CHOL 205 (H) 03/30/2022   HDL 58.50 03/30/2022   LDLCALC 134 (H) 03/30/2022   LDLDIRECT 82.0 08/26/2019   TRIG 62.0 03/30/2022   CHOLHDL 4 03/30/2022    Medications Reviewed Today     Reviewed by Merceda Lela SAUNDERS, RPH (Pharmacist) on 09/09/23 at 1102  Med List Status: <None>   Medication Order Taking? Sig Documenting Provider Last Dose Status Informant  aspirin  81 MG chewable tablet [id number]  Chew 81 mg by mouth daily. [provider]  Active   Continuous Blood Gluc Receiver WILMOT G7 [place]) [place] [id number]  Use as directed four times per day E11.9 Norleen Lynwood ORN, MD  Active   Continuous Glucose Sensor (DEXCOM G7 [place]) [place] [id number]  Apply 1 sensor as directed once every 10 days E11.9 Norleen Lynwood ORN, MD  Active   insulin  glargine, 1  Unit Dial , (TOUJEO ) 300 UNIT/ML Solostar Pen [id number] Yes Start at 40 units, increase by 5 units every week if fasting blood sugars remain above 150, until you reach 50 units daily. Norleen Lynwood ORN, MD  Active   insulin  lispro (HUMALOG ) 100 UNIT/ML KwikPen [id number] Yes Inject 5 units prior to breakfast. For lunch and supper: inject 10 units if BG < 200 prior to meal, 15 units if BG >200 prior to meal, or 20 units if BG is >300 prior to  meal. Norleen Lynwood ORN, MD  Active   Insulin  Pen Needle (PEN NEEDLES) 32G X 6 MM MISC [id number]  To use with insulin  injections up to 4x per day Norleen Lynwood ORN, MD  Active   Insulin  Syringes, Disposable, U-100 0.3 ML MISC [id number]  Insulin  syringes Norleen Lynwood ORN, MD  Active   lisinopril  (ZESTRIL ) 5 MG tablet [id number]  Take 1 tablet (5 mg total) by mouth daily. Norleen Lynwood ORN, MD  Active   rosuvastatin  (CRESTOR ) 40 MG tablet [id number]  Take 1 tablet (40 mg total) by mouth daily. Norleen Lynwood ORN, MD  Active   tadalafil  (CIALIS ) 20 MG tablet [id number]  Take 0.5-1 tablets (10-20 mg total) by mouth every other day as needed for erectile dysfunction. Norleen Lynwood ORN, MD  Active             Assessment/Plan:   Diabetes: - Currently uncontrolled, A1c <7% - Reviewed goal A1c, goal fasting, and goal 2 hour post prandial glucose - Reviewed dietary modifications including increased protein and fiber - Reviewed lifestyle modifications including strength training - Increase Toujeo  to 45 units daily, Continue Humalog  sliding scale - Focus on taking Humalog  consistently, especially prior to largest meal. Avoid taking Humalog  without food.  Hyperlipidemia/ASCVD Risk Reduction: - Currently uncontrolled. LDL goal <70 - Continue rosuvastatin  40 mg daily   Follow Up Plan: 8/12  Darrelyn Drum, PharmD, BCPS, CPP Clinical Pharmacist Practitioner  Primary Care at [facility] [phone]

## 2023-09-24 ENCOUNTER — Other Ambulatory Visit

## 2023-10-10 ENCOUNTER — Other Ambulatory Visit (INDEPENDENT_AMBULATORY_CARE_PROVIDER_SITE_OTHER): Admitting: Pharmacist

## 2023-10-10 DIAGNOSIS — E1165 Type 2 diabetes mellitus with hyperglycemia: Secondary | ICD-10-CM

## 2023-10-10 DIAGNOSIS — Z794 Long term (current) use of insulin: Secondary | ICD-10-CM

## 2023-10-10 DIAGNOSIS — E782 Mixed hyperlipidemia: Secondary | ICD-10-CM

## 2023-10-10 MED ORDER — CEQUR SIMPLICITY INSERTER MISC: 0 refills | Status: DC

## 2023-10-10 MED ORDER — CEQUR SIMPLICITY 2U DEVI: 5 refills | Status: DC

## 2023-10-10 MED ORDER — INSULIN GLARGINE (1 UNIT DIAL) 300 UNIT/ML ~~LOC~~ SOPN: PEN_INJECTOR | SUBCUTANEOUS | 5 refills | Status: AC

## 2023-10-10 NOTE — Patient Instructions (Addendum)
 It was a pleasure speaking with you today!  -Decrease Toujeo  to 35 units daily, Continue Humalog  sliding scale -Focus on taking Humalog  consistently, especially prior to largest meal. Avoid taking Humalog  without food. -I sent the CeQur insulin  patch to your pharmacy  Feel free to call with any questions or concerns!  Lum Ricks, PharmD Candidate  [employer], [employer]    Darrelyn Drum, PharmD, [employer], CPP Clinical Pharmacist Practitioner [place] [facility] [phone]

## 2023-10-10 NOTE — Progress Notes (Signed)
 10/10/2023 Name: William Sheppard MRN: [id number] DOB: 05-Jan-1971  Chief Complaint  Patient presents with   Hyperlipidemia   Medication Management   Diabetes    MARCE CHARLESWORTH is a 53 y.o. year old male who presented for a telephone visit.   They were referred to the pharmacist by their PCP for assistance in managing diabetes and medication access.   Subjective:  Care Team: Primary Care Provider: Norleen Lynwood ORN, MD ; Next Scheduled Visit: 11/01/2023  Medication Access/Adherence  Current Pharmacy:  [facility] [address] ([address]), [place] - [address] [place] ([place]) [place] [address] Phone: [phone] Fax: [phone]  [facility] [id number] [place], [place] - [address] [place] [address] Phone: [phone] Fax: [phone]  [id number] [place] - [address] [place] [address] Phone: [phone] Fax: [phone]  DARRYLE LONG - [facility] [address] [place] [address] Phone: [phone] Fax: [phone]   Patient reports affordability concerns with their medications: Yes  Patient reports access/transportation concerns to their pharmacy: No  Patient reports adherence concerns with their medications:  Yes    Now has insurance through work  Diabetes: Current medications: Humalog  - taking 8 units prior to meals unless BG >250 then uses 13-15 units (has been taking more consistently) **Pt stopped taking Toujeo , felt that fasting BG were controlled  Medications tried in the past: Farxiga  (dehydration requiring hospitalization), metformin (not tolerated)   Current glucose readings:  Was using Dexcom but has not had sensors, needing refill -Patient has not been using his dexcom, but checking BG manually.  -Notes a high of 290-300s after eating pasta.  -He is taking notes of carb intake and adjusting Humalog  sliding  scale. He has been taking humalog  before he eats and not after now.  -He noted fasting readings to be 115, 140, 170, 200  Current lifestyle: Has been eating better/lighter, and more active (walking 10 miles per day at job & frequently lifting heavy objects),   Meals: Sometimes breakfast but definitely eats lunch and dinner with snacks in between He is trying not to eat after 7pm, counting carbs   Lipids:  Current Medication: rosuvastatin  40 mg daily   Objective:  Lab Results  Component Value Date   HGBA1C 11.0 (H) 08/09/2023    Lab Results  Component Value Date   CREATININE 1.12 03/30/2022   BUN 21 03/30/2022   NA 141 03/30/2022   K 4.2 03/30/2022   CL 105 03/30/2022   CO2 29 03/30/2022    Lab Results  Component Value Date   CHOL 205 (H) 03/30/2022   HDL 58.50 03/30/2022   LDLCALC 134 (H) 03/30/2022   LDLDIRECT 82.0 08/26/2019   TRIG 62.0 03/30/2022   CHOLHDL 4 03/30/2022    Medications Reviewed Today   Medications were not reviewed in this encounter     Assessment/Plan:   Diabetes: - Currently uncontrolled, A1c <7% - Reviewed goal A1c, goal fasting, and goal 2 hour post prandial glucose - Reviewed dietary modifications including increased protein and fiber - Reviewed lifestyle modifications including strength training - Fasting BG is still elevated, but pt taking Humalog  more consistently. Recommended to restart Toujeo , at decreased dose of 35 units daily, Continue Humalog  8 units prior to every meal - Focus on taking Humalog  consistently, especially prior to largest meal. Avoid taking Humalog  without food. -Discussed CeQur patch with patient as an option.  He is very interested in trying this. Sent to pharmacy - 30 DS is $35 for patch and $4 for the injection device  Hyperlipidemia/ASCVD Risk Reduction: - Currently uncontrolled. LDL goal <70 - Continue rosuvastatin  40 mg daily   Follow Up Plan: 9/16 Lum Ricks, PharmD Candidate  [facility], [employer]    Darrelyn Drum, PharmD, [employer], CPP Clinical Pharmacist Practitioner [place] [facility] at [facility] [phone]

## 2023-10-28 ENCOUNTER — Ambulatory Visit: Admitting: Internal Medicine

## 2023-10-28 ENCOUNTER — Encounter: Payer: Self-pay | Admitting: Internal Medicine

## 2023-10-28 VITALS — BP 132/84 | HR 86 | Temp 98.7°F | Ht 74.0 in | Wt 220.6 lb

## 2023-10-28 DIAGNOSIS — Z125 Encounter for screening for malignant neoplasm of prostate: Secondary | ICD-10-CM

## 2023-10-28 DIAGNOSIS — E782 Mixed hyperlipidemia: Secondary | ICD-10-CM

## 2023-10-28 DIAGNOSIS — M779 Enthesopathy, unspecified: Secondary | ICD-10-CM | POA: Diagnosis not present

## 2023-10-28 DIAGNOSIS — E559 Vitamin D deficiency, unspecified: Secondary | ICD-10-CM

## 2023-10-28 DIAGNOSIS — Z0001 Encounter for general adult medical examination with abnormal findings: Secondary | ICD-10-CM

## 2023-10-28 DIAGNOSIS — E119 Type 2 diabetes mellitus without complications: Secondary | ICD-10-CM

## 2023-10-28 DIAGNOSIS — E538 Deficiency of other specified B group vitamins: Secondary | ICD-10-CM | POA: Diagnosis not present

## 2023-10-28 DIAGNOSIS — M5412 Radiculopathy, cervical region: Secondary | ICD-10-CM

## 2023-10-28 LAB — HEMOGLOBIN A1C: Hgb A1c MFr Bld: 10.5 % — ABNORMAL HIGH (ref 4.6–6.5)

## 2023-10-28 LAB — LIPID PANEL
Cholesterol: 160 mg/dL (ref 0–200)
HDL: 52.4 mg/dL (ref >39.00)
LDL Cholesterol: 85 mg/dL (ref 0–99)
NonHDL: 108.06
Total CHOL/HDL Ratio: 3
Triglycerides: 116 mg/dL (ref 0.0–149.0)
VLDL: 23.2 mg/dL (ref 0.0–40.0)

## 2023-10-28 LAB — HEPATIC FUNCTION PANEL
ALT: 25 U/L (ref 0–53)
AST: 15 U/L (ref 0–37)
Albumin: 4.6 g/dL (ref 3.5–5.2)
Alkaline Phosphatase: 48 U/L (ref 39–117)
Bilirubin, Direct: 0.1 mg/dL (ref 0.0–0.3)
Total Bilirubin: 0.4 mg/dL (ref 0.2–1.2)
Total Protein: 7.6 g/dL (ref 6.0–8.3)

## 2023-10-28 LAB — BASIC METABOLIC PANEL WITH GFR
BUN: 21 mg/dL (ref 6–23)
CO2: 26 meq/L (ref 19–32)
Calcium: 9.5 mg/dL (ref 8.4–10.5)
Chloride: 104 meq/L (ref 96–112)
Creatinine, Ser: 1.35 mg/dL (ref 0.40–1.50)
GFR: 60.62 mL/min (ref >60.00)
Glucose, Bld: 125 mg/dL — ABNORMAL HIGH (ref 70–99)
Potassium: 4.4 meq/L (ref 3.5–5.1)
Sodium: 139 meq/L (ref 135–145)

## 2023-10-28 LAB — MICROALBUMIN / CREATININE URINE RATIO
Creatinine,U: 181.9 mg/dL
Microalb Creat Ratio: 109 mg/g — ABNORMAL HIGH (ref 0.0–30.0)
Microalb, Ur: 19.8 mg/dL — ABNORMAL HIGH (ref 0.0–1.9)

## 2023-10-28 LAB — URINALYSIS, ROUTINE W REFLEX MICROSCOPIC
Bilirubin Urine: NEGATIVE
Hgb urine dipstick: NEGATIVE
Ketones, ur: NEGATIVE
Leukocytes,Ua: NEGATIVE
Nitrite: NEGATIVE
Specific Gravity, Urine: >=1.03 — AB (ref 1.000–1.030)
Total Protein, Urine: 30 — AB
Urine Glucose: NEGATIVE
Urobilinogen, UA: 0.2 (ref 0.0–1.0)
pH: 6 (ref 5.0–8.0)

## 2023-10-28 LAB — VITAMIN B12: Vitamin B-12: 487 pg/mL (ref 211–911)

## 2023-10-28 LAB — CBC WITH DIFFERENTIAL/PLATELET
Basophils Absolute: 0 K/uL (ref 0.0–0.1)
Basophils Relative: 0.5 % (ref 0.0–3.0)
Eosinophils Absolute: 0 K/uL (ref 0.0–0.7)
Eosinophils Relative: 1 % (ref 0.0–5.0)
HCT: 41.5 % (ref 39.0–52.0)
Hemoglobin: 13 g/dL (ref 13.0–17.0)
Lymphocytes Relative: 48.6 % — ABNORMAL HIGH (ref 12.0–46.0)
Lymphs Abs: 1.8 K/uL (ref 0.7–4.0)
MCHC: 31.4 g/dL (ref 30.0–36.0)
MCV: 68.7 fl — ABNORMAL LOW (ref 78.0–100.0)
Monocytes Absolute: 0.3 K/uL (ref 0.1–1.0)
Monocytes Relative: 7.4 % (ref 3.0–12.0)
Neutro Abs: 1.6 K/uL (ref 1.4–7.7)
Neutrophils Relative %: 42.5 % — ABNORMAL LOW (ref 43.0–77.0)
Platelets: 212 K/uL (ref 150.0–400.0)
RBC: 6.04 Mil/uL — ABNORMAL HIGH (ref 4.22–5.81)
RDW: 15.2 % (ref 11.5–15.5)
WBC: 3.7 K/uL — ABNORMAL LOW (ref 4.0–10.5)

## 2023-10-28 LAB — TSH: TSH: 1.04 u[IU]/mL (ref 0.35–5.50)

## 2023-10-28 LAB — VITAMIN D 25 HYDROXY (VIT D DEFICIENCY, FRACTURES): VITD: 20.27 ng/mL — ABNORMAL LOW (ref 30.00–100.00)

## 2023-10-28 LAB — PSA: PSA: 0.92 ng/mL (ref 0.10–4.00)

## 2023-10-28 MED ORDER — PREDNISONE 10 MG PO TABS: ORAL_TABLET | ORAL | 0 refills | Status: AC

## 2023-10-28 MED ORDER — MELOXICAM 15 MG PO TABS: 15.0000 mg | ORAL_TABLET | Freq: Every day | ORAL | 1 refills | Status: AC | PRN

## 2023-10-28 NOTE — Assessment & Plan Note (Signed)
Last vitamin D Lab Results  Component Value Date   VD25OH 19.09 (L) 03/30/2022   Low, to start oral replacement

## 2023-10-28 NOTE — Assessment & Plan Note (Signed)
 Mild without neuro change, now for mobic  15 mg prn, refer sports medicine

## 2023-10-28 NOTE — Assessment & Plan Note (Signed)
 Age and sex appropriate education and counseling updated with regular exercise and diet Referrals for preventative services - none needed Immunizations addressed - declines all immunizations Smoking counseling  - none needed Evidence for depression or other mood disorder - none significant Most recent labs reviewed. I have personally reviewed and have noted: 1) the patient's medical and social history 2) The patient's current medications and supplements 3) The patient's height, weight, and BMI have been recorded in the chart

## 2023-10-28 NOTE — Assessment & Plan Note (Signed)
 Mild right palmar index and second finger tendons it seems, for mobic  15 mg prn, and pt will let us  know if he would want hand surgury referral

## 2023-10-28 NOTE — Assessment & Plan Note (Signed)
 Lab Results  Component Value Date   LDLCALC 134 (H) 03/30/2022   Uncontrolled, has had good compliance now with statin, pt to continue current statin crestor  40 mg every day and recheck lab today

## 2023-10-28 NOTE — Assessment & Plan Note (Signed)
 With better control in the 150's recently, but still few high and lows, and pt requests endo referral, also refer optho for yearly exam

## 2023-10-28 NOTE — Patient Instructions (Signed)
 Please take all new medication as prescribed - the mobic  as needed, and prednisone   Please continue all other medications as before, and refills have been done if requested.  Please have the pharmacy call with any other refills you may need.  Please continue your efforts at being more active, low cholesterol diet, and weight control.  You are otherwise up to date with prevention measures today.  Please keep your appointments with your specialists as you may have planned  You will be contacted regarding the referral for: Sports Medicine, Endocrinology and Eye doctor  Please go to the LAB at the blood drawing area for the tests to be done  You will be contacted by phone if any changes need to be made immediately.  Otherwise, you will receive a letter about your results with an explanation, but please check with MyChart first.  Please make an Appointment to return in 6 months, or sooner if needed

## 2023-10-28 NOTE — Progress Notes (Signed)
 Patient ID: William Sheppard, male   DOB: [birth year], 53 y.o.   MRN: [id number]         Chief Complaint:: wellness exam and Shoulder Pain (Intermittent shoulder pain (left) noted a month ago. Does come with some migraine/neck pain. Currently treating with tylenol )  , left cervical radiculitis, right palmar tendonitis, DM, hld, low vit d       HPI:  William Sheppard is a 53 y.o. male here for wellness exam; declines all immunizations, due for eye exam, o/w up to date                        Also has 2 mo onset left post neck grinding noises with pain and reduced ROM to turning head left, in addition to numbness intermittent to hand.  Also has right palmar pain and soreness though has not being overusing it seems.  Pt denies chest pain, increased sob or doe, wheezing, orthopnea, PND, increased LE swelling, palpitations, dizziness or syncope.   Pt denies polydipsia, polyuria, or new focal neuro s/s.    Pt denies fever, wt loss, night sweats, loss of appetite, or other constitutional symptoms    Wt Readings from Last 3 Encounters:  10/28/23 220 lb 9.6 oz (100.1 kg)  05/01/23 211 lb (95.7 kg)  08/23/22 217 lb (98.4 kg)   BP Readings from Last 3 Encounters:  10/28/23 132/84  05/01/23 118/82  08/23/22 126/82   Immunization History  Administered Date(s) Administered   PFIZER(Purple Top)SARS-COV-2 Vaccination 05/30/2019, 06/21/2019   Health Maintenance Due  Topic Date Due   Diabetic kidney evaluation - Urine ACR  Never done   DTaP/Tdap/Td (1 - Tdap) Never done   Pneumococcal Vaccine: 50+ Years (1 of 2 - PCV) Never done   Hepatitis B Vaccines 19-59 Average Risk (1 of 3 - 19+ 3-dose series) Never done   Zoster Vaccines- Shingrix (1 of 2) Never done   OPHTHALMOLOGY EXAM  06/13/2022   Diabetic kidney evaluation - eGFR measurement  03/31/2023   Influenza Vaccine  Never done      Past Medical History:  Diagnosis Date   AKI (acute kidney injury) (HCC) 02/04/2021   Anxiety 09/05/2021   Chest pain of  uncertain etiology 05/10/2021   Chronic headaches    Cold intolerance 08/01/2020   Daytime somnolence 09/18/2012   Diabetes mellitus due to underlying condition with unspecified complications (HCC) 05/10/2021   Diabetes mellitus type II, non insulin  dependent (HCC) 03/11/2015   Dizziness 07/21/2015   DKA (diabetic ketoacidosis) (HCC) 02/04/2021   Encounter for well adult exam with abnormal findings 03/08/2021   Erectile dysfunction 01/09/2020   Essential hypertension 05/09/2021   Hodgkin lymphoma, unspecified, unspecified site (HCC) 05/09/2021   Hodgkin lymphoma, unspecified, unspecified site (HCC) 05/09/2021   Hodgkin's lymphoma (HCC)    Hyperglycemia due to type 2 diabetes mellitus (HCC) 05/09/2021   Hyperlipidemia    Inadequate sleep hygiene 10/23/2012   Left hip pain 02/28/2016   Low back pain 03/08/2021   Low serum testosterone  level 11/04/2013   Male hypogonadism 11/05/2013   Mid back pain on left side 05/31/2012   Neck pain on left side 11/08/2016   Numbness 11/03/2012   Pain in right hand 10/09/2017   Pain of right thumb 09/27/2017   Pure hypercholesterolemia 05/09/2021   Right flank pain 12/08/2018   Thalassemia, alpha (HCC) 03/11/2015   Vitamin D  deficiency 01/09/2020   Past Surgical History:  Procedure Laterality Date   HERNIA REPAIR  as a child   PORTACATH PLACEMENT  02/12/1997   WISDOM TOOTH EXTRACTION      reports that he has never smoked. He has never used smokeless tobacco. He reports current alcohol use. He reports that he does not use drugs. family history is not on file. He was adopted. Allergies  Allergen Reactions   Dapagliflozin      Other reaction(s): dehydration   Current Outpatient Medications on File Prior to Visit  Medication Sig Dispense Refill   aspirin  81 MG chewable tablet Chew 81 mg by mouth daily.     Continuous Blood Gluc Receiver (DEXCOM G7 RECEIVER) DEVI Use as directed four times per day E11.9 1 each 0   Continuous Glucose Sensor  (DEXCOM G7 SENSOR) MISC Apply 1 sensor as directed once every 10 days E11.9 9 each 1   injection device for insulin  (CEQUR SIMPLICITY 2U) [place]VI Apply patch every 4 days. Use to inject Humalog  4 clicks (8 units) TID before each meal. 8 each 5   Injection Device for Insulin  (CEQUR SIMPLICITY INSERTER) MISC Use as directed to apply CeQur Simplicity Insulin  Patch 1 each 0   insulin  glargine, 1 Unit Dial , (TOUJEO ) 300 UNIT/ML Solostar Pen Take 35 units SQ daily 15 mL 5   insulin  lispro (HUMALOG ) 100 UNIT/ML KwikPen Inject 5 units prior to breakfast. For lunch and supper: inject 10 units if BG < 200 prior to meal, 15 units if BG >200 prior to meal, or 20 units if BG is >300 prior to meal. 15 mL 2   Insulin  Pen Needle (PEN NEEDLES) 32G X 6 MM MISC To use with insulin  injections up to 4x per day 100 each 1   Insulin  Syringes, Disposable, U-100 0.3 ML MISC Insulin  syringes 100 each 0   lisinopril  (ZESTRIL ) 5 MG tablet Take 1 tablet (5 mg total) by mouth daily. 90 tablet 0   rosuvastatin  (CRESTOR ) 40 MG tablet Take 1 tablet (40 mg total) by mouth daily. 90 tablet 0   tadalafil  (CIALIS ) 20 MG tablet Take 0.5-1 tablets (10-20 mg total) by mouth every other day as needed for erectile dysfunction. 5 tablet 11   No current facility-administered medications on file prior to visit.        ROS:  All others reviewed and negative.  Objective        PE:  BP 132/84   Pulse 86   Temp 98.7 F (37.1 C)   Ht 6' 2 (1.88 m)   Wt 220 lb 9.6 oz (100.1 kg)   SpO2 97%   BMI 28.32 kg/m                 Constitutional: Pt appears in NAD               HENT: Head: NCAT.                Right Ear: External ear normal.                 Left Ear: External ear normal.                Eyes: . Pupils are equal, round, and reactive to light. Conjunctivae and EOM are normal               Nose: without d/c or deformity               Neck: Neck supple. Gross normal ROM  Cardiovascular: Normal rate and regular rhythm.                  Pulmonary/Chest: Effort normal and breath sounds without rales or wheezing.                Abd:  Soft, NT, ND, + BS, no organomegaly               Neurological: Pt is alert. At baseline orientation, motor grossly intact               Skin: Skin is warm. No rashes, no other new lesions, LE edema - none               Psychiatric: Pt behavior is normal without agitation   Micro: none  Cardiac tracings I have personally interpreted today:  none  Pertinent Radiological findings (summarize): none   Lab Results  Component Value Date   WBC 4.7 03/30/2022   HGB 13.5 03/30/2022   HCT 43.1 03/30/2022   PLT 210.0 03/30/2022   GLUCOSE 67 (L) 03/30/2022   CHOL 205 (H) 03/30/2022   TRIG 62.0 03/30/2022   HDL 58.50 03/30/2022   LDLDIRECT 82.0 08/26/2019   LDLCALC 134 (H) 03/30/2022   ALT 30 03/30/2022   AST 19 03/30/2022   NA 141 03/30/2022   K 4.2 03/30/2022   CL 105 03/30/2022   CREATININE 1.12 03/30/2022   BUN 21 03/30/2022   CO2 29 03/30/2022   TSH 0.85 03/30/2022   PSA 1.15 03/30/2022   HGBA1C 11.0 (H) 08/09/2023   Assessment/Plan:  Kasem L Kissel is a 53 y.o. Black or African American [2] male with  has a past medical history of AKI (acute kidney injury) (HCC) (02/04/2021), Anxiety (09/05/2021), Chest pain of uncertain etiology (05/10/2021), Chronic headaches, Cold intolerance (08/01/2020), Daytime somnolence (09/18/2012), Diabetes mellitus due to underlying condition with unspecified complications (HCC) (05/10/2021), Diabetes mellitus type II, non insulin  dependent (HCC) (03/11/2015), Dizziness (07/21/2015), DKA (diabetic ketoacidosis) (HCC) (02/04/2021), Encounter for well adult exam with abnormal findings (03/08/2021), Erectile dysfunction (01/09/2020), Essential hypertension (05/09/2021), Hodgkin lymphoma, unspecified, unspecified site (HCC) (05/09/2021), Hodgkin lymphoma, unspecified, unspecified site (HCC) (05/09/2021), Hodgkin's lymphoma (HCC), Hyperglycemia due to  type 2 diabetes mellitus (HCC) (05/09/2021), Hyperlipidemia, Inadequate sleep hygiene (10/23/2012), Left hip pain (02/28/2016), Low back pain (03/08/2021), Low serum testosterone  level (11/04/2013), Male hypogonadism (11/05/2013), Mid back pain on left side (05/31/2012), Neck pain on left side (11/08/2016), Numbness (11/03/2012), Pain in right hand (10/09/2017), Pain of right thumb (09/27/2017), Pure hypercholesterolemia (05/09/2021), Right flank pain (12/08/2018), Thalassemia, alpha (HCC) (03/11/2015), and Vitamin D  deficiency (01/09/2020).  Encounter for well adult exam with abnormal findings Age and sex appropriate education and counseling updated with regular exercise and diet Referrals for preventative services - none needed Immunizations addressed  - declines all immunizations Smoking counseling  - none needed Evidence for depression or other mood disorder - none significant Most recent labs reviewed. I have personally reviewed and have noted: 1) the patient's medical and social history 2) The patient's current medications and supplements 3) The patient's height, weight, and BMI have been recorded in the chart   Diabetes mellitus type II, non insulin  dependent (HCC) With better control in the 150's recently, but still few high and lows, and pt requests endo referral, also refer optho for yearly exam  Hyperlipidemia Lab Results  Component Value Date   LDLCALC 134 (H) 03/30/2022   Uncontrolled, has had good compliance now with statin, pt to continue current statin crestor  40  mg every day and recheck lab today   Vitamin D  deficiency Last vitamin D  Lab Results  Component Value Date   VD25OH 19.09 (L) 03/30/2022   Low, to start oral replacement   Radiculitis of left cervical region Mild without neuro change, now for mobic  15 mg prn, refer sports medicine  Tendonitis Mild right palmar index and second finger tendons it seems, for mobic  15 mg prn, and pt will let us  know if he  would want hand surgury referral  Followup: Return in about 6 months (around 04/26/2024).  Lynwood Rush, MD 10/28/2023 12:11 PM [facility] [facility]

## 2023-10-29 ENCOUNTER — Ambulatory Visit: Payer: Self-pay | Admitting: Internal Medicine

## 2023-10-29 ENCOUNTER — Other Ambulatory Visit (INDEPENDENT_AMBULATORY_CARE_PROVIDER_SITE_OTHER): Admitting: Pharmacist

## 2023-10-29 ENCOUNTER — Other Ambulatory Visit: Payer: Self-pay | Admitting: Internal Medicine

## 2023-10-29 DIAGNOSIS — E1165 Type 2 diabetes mellitus with hyperglycemia: Secondary | ICD-10-CM

## 2023-10-29 DIAGNOSIS — Z794 Long term (current) use of insulin: Secondary | ICD-10-CM

## 2023-10-29 MED ORDER — INSULIN LISPRO 100 UNIT/ML IJ SOLN: INTRAMUSCULAR | 11 refills | Status: DC

## 2023-10-29 NOTE — Patient Instructions (Signed)
 It was a pleasure speaking with you today!  Continue your current regimen.  Come this week for walk-in lab work to check your fructosamine level. This will help us  determine if your Dexcom GMI versus recent A1c is more accurate.  Feel free to call with any questions or concerns!  Darrelyn Drum, PharmD, BCPS, CPP Clinical Pharmacist Practitioner [place] [facility] at [facility] [phone]

## 2023-10-29 NOTE — Progress Notes (Signed)
 10/29/2023 Name: William Sheppard MRN: [id number] DOB: 01/22/1971  Chief Complaint  Patient presents with   Diabetes   Medication Management    William Sheppard is a 53 y.o. year old male who presented for a telephone visit.   They were referred to the pharmacist by their PCP for assistance in managing diabetes and medication access.   Subjective:  Care Team: Primary Care Provider: Norleen Lynwood ORN, MD ; Next Scheduled Visit: 11/01/2023  Medication Access/Adherence  Current Pharmacy:  [facility] [id number] [place], [place] - [address] [place] [address] Phone: [phone] Fax: [phone]  [id number] - [place] ([place]), [place] - [address] [place] ([place]) [place] [address] Phone: [phone] Fax: [phone]  [id number] [place],  - [address] [place] [address] Phone: [phone] Fax: [phone]  DARRYLE LONG - [facility] [address] [place] [address] Phone: [phone] Fax: [phone]   Patient reports affordability concerns with their medications: Yes  Patient reports access/transportation concerns to their pharmacy: No  Patient reports adherence concerns with their medications:  Yes    Now has insurance through work  Diabetes: Current medications: Humalog  - taking 8 units prior to meals unless BG >250 then uses 13-15 units (has been taking more consistently), Toujeo  40 units daily **Pt has started using CeQur insulin  patch for Humalog  and has found this very convenient, however notes he has had to waste a lot of insulin  after 4 days of wearing the patch. Has been loading patch with 200 units. Medications tried in the past: Farxiga  (dehydration requiring hospitalization), metformin (not tolerated)   Current glucose readings:    Current lifestyle: Has been eating better/lighter, and more active (walking 10  miles per day at job & frequently lifting heavy objects),   Meals: Sometimes breakfast but definitely eats lunch and dinner with snacks in between He is trying not to eat after 7pm, counting carbs   Lipids:  Current Medication: rosuvastatin  40 mg daily   Objective:  Lab Results  Component Value Date   HGBA1C 10.5 (H) 10/28/2023    Lab Results  Component Value Date   CREATININE 1.35 10/28/2023   BUN 21 10/28/2023   NA 139 10/28/2023   K 4.4 10/28/2023   CL 104 10/28/2023   CO2 26 10/28/2023    Lab Results  Component Value Date   CHOL 160 10/28/2023   HDL 52.40 10/28/2023   LDLCALC 85 10/28/2023   LDLDIRECT 82.0 08/26/2019   TRIG 116.0 10/28/2023   CHOLHDL 3 10/28/2023    Medications Reviewed Today     Reviewed by Merceda Lela SAUNDERS, RPH (Pharmacist) on 10/29/23 at 1630  Med List Status: <None>   Medication Order Taking? Sig Documenting Provider Last Dose Status Informant  aspirin  81 MG chewable tablet [id number]  Chew 81 mg by mouth daily. [provider]  Active   Continuous Blood Gluc Receiver WILMOT G7 [place]) [place] [id number] Yes Use as directed four times per day E11.9 Norleen Lynwood ORN, MD  Active   Continuous Glucose Sensor (DEXCOM G7 [place]) [place] [id number]  Apply 1 sensor as directed once every 10 days E11.9 Norleen Lynwood ORN, MD  Active   injection device for insulin  [facility] SIMPLICITY 2U) DEVI [id number] Yes Apply patch every 4 days. Use to inject Humalog  4 clicks (8 units) TID before each meal. Norleen Lynwood ORN, MD  Active   Injection Device  for Insulin  (CEQUR SIMPLICITY INSERTER) MISC [id number]  Use as directed to apply CeQur Simplicity Insulin  Patch Norleen Lynwood ORN, MD  Active   insulin  glargine, 1 Unit Dial , (TOUJEO ) 300 UNIT/ML Solostar Pen [id number] Yes Take 35 units SQ daily Norleen Lynwood ORN, MD  Active   insulin  lispro (HUMALOG ) 100 UNIT/ML KwikPen [id number] Yes Inject 5 units prior to breakfast. For lunch and supper: inject 10 units if BG < 200 prior to  meal, 15 units if BG >200 prior to meal, or 20 units if BG is >300 prior to meal. Norleen Lynwood ORN, MD  Active   Insulin  Pen Needle (PEN NEEDLES) 32G X 6 MM MISC [id number]  To use with insulin  injections up to 4x per day Norleen Lynwood ORN, MD  Active   Insulin  Syringes, Disposable, U-100 0.3 ML MISC [id number]  Insulin  syringes Norleen Lynwood ORN, MD  Active   lisinopril  (ZESTRIL ) 5 MG tablet [id number]  Take 1 tablet (5 mg total) by mouth daily. Norleen Lynwood ORN, MD  Active   meloxicam  (MOBIC ) 15 MG tablet [id number]  Take 1 tablet (15 mg total) by mouth daily as needed. Norleen Lynwood ORN, MD  Active   predniSONE  (DELTASONE ) 10 MG tablet [id number]  3 tabs by mouth per day for 3 days,2tabs per day for 3 days,1tab per day for 3 days Norleen Lynwood ORN, MD  Active   rosuvastatin  (CRESTOR ) 40 MG tablet [id number]  Take 1 tablet (40 mg total) by mouth daily. Norleen Lynwood ORN, MD  Active   tadalafil  (CIALIS ) 20 MG tablet [id number]  Take 0.5-1 tablets (10-20 mg total) by mouth every other day as needed for erectile dysfunction. Norleen Lynwood ORN, MD  Active             Assessment/Plan:   Diabetes: - Currently uncontrolled, A1c <7% - GMI for last 14 days is showing 7.6% while recent A1c was 10.5% - Reviewed goal A1c, goal fasting, and goal 2 hour post prandial glucose - Reviewed dietary modifications including increased protein and fiber - Reviewed lifestyle modifications including strength training - Instructed to only load 120 units into CeQur patch to avoid wasting insulin . Will send Humalog  in vials to make loading patch easier. - Recommend to continue Toujeo  and Humalog  at the same dose. - Will check fructosamine to see if A1c vs GMI is more accurate.    Hyperlipidemia/ASCVD Risk Reduction: - Currently uncontrolled. LDL goal <70 - Continue rosuvastatin  40 mg daily   Follow Up Plan: Walk-in lab this week   Darrelyn Drum, PharmD, BCPS, CPP Clinical Pharmacist Practitioner [place] [facility] [phone]

## 2023-11-01 ENCOUNTER — Ambulatory Visit: Payer: Self-pay | Admitting: Internal Medicine

## 2023-11-04 ENCOUNTER — Telehealth: Payer: Self-pay | Admitting: Pharmacist

## 2023-11-04 ENCOUNTER — Other Ambulatory Visit (INDEPENDENT_AMBULATORY_CARE_PROVIDER_SITE_OTHER)

## 2023-11-04 DIAGNOSIS — Z794 Long term (current) use of insulin: Secondary | ICD-10-CM | POA: Diagnosis not present

## 2023-11-04 DIAGNOSIS — E1165 Type 2 diabetes mellitus with hyperglycemia: Secondary | ICD-10-CM | POA: Diagnosis not present

## 2023-11-05 NOTE — Telephone Encounter (Signed)
 Received MyChart message over the weekend from patient stating he was having low BG readings. Called the patient Monday 9/22 AM to discuss BG readings. Noted Dexcom data from over the weekend showed near constant low BG:     Pt reports he kept eating more to get BG to increase and it would remain low. He did not have his glucometer to check fingerstick. He went to the ED where they checked his BG and it was 290 despite his Dexcom saying it was in the 40s. He removed his sensor Sunday evening and has not reapplied yet. He plans to contact Dexcom to report the sensor error. He is coming today for lab work to check fructosamine level since A1c seemed falsely elevated on last check. Will await results.  Darrelyn Drum, PharmD, BCPS, CPP Clinical Pharmacist Practitioner [place] [facility] [phone]

## 2023-11-07 LAB — FRUCTOSAMINE: Fructosamine: 310 umol/L — ABNORMAL HIGH (ref 205–285)

## 2023-11-08 ENCOUNTER — Ambulatory Visit: Payer: Self-pay | Admitting: Pharmacist

## 2023-11-12 NOTE — Progress Notes (Unsigned)
 Ben Jackson D.[facility] [address] [place] [address] Phone: [phone]   Assessment and Plan:     1. Neck pain (Primary) 2. Strain of left trapezius muscle, initial encounter -Chronic with exacerbation, initial visit - Consistent with strain of cervical paraspinal, left trapezius likely due to physical activity, holding grandson - Will obtain x-ray at today's visit and can further discuss at follow-up visit - Start meloxicam  15 mg daily x2 weeks.  If still having pain after 2 weeks, complete 3rd-week of NSAID. May use remaining NSAID as needed once daily for pain control.  Do not to use additional over-the-counter NSAIDs (ibuprofen , naproxen , Advil , Aleve , etc.) while taking prescription NSAIDs.  May use Tylenol  [phone] mg 2 to 3 times a day for breakthrough pain. - Start HEP for neck and trapezius - Patient experienced temporary relief with prednisone  course, however pain returned after completing prednisone  course  15 additional minutes spent for educating Therapeutic Home Exercise Program.  This included exercises focusing on stretching, strengthening, with focus on eccentric aspects.   Long term goals include an improvement in range of motion, strength, endurance as well as avoiding reinjury. Patient's frequency would include in 1-2 times a day, 3-5 times a week for a duration of 6-12 weeks. Proper technique shown and discussed handout in great detail with ATC.  All questions were discussed and answered.    3. Trigger finger, acquired -Chronic with exacerbation, initial visit - Trigger finger of bilateral fourth digit, currently worse on right.  Likely due to video gaming causing irritation - Recommend altering gaming controller to decrease irritation to base of fourth digit - Recommend using Voltaren  gel topically over palmar surface of fourth MCP bilaterally    Pertinent previous records reviewed include none   Follow Up: As  needed if no improvement or worsening of symptoms.  Would review C-spine x-ray.  Could consider further discussing OMT versus physical therapy   Subjective:   I, Bacilio Abascal, am serving as a Neurosurgeon for Doctor Morene Mace  Chief Complaint: left shoulder and neck pain   HPI:   11/13/2023 Patient is a 53 year old male with left shoulder and neck pain. Patient states left sided tingling in the neck and down the arm when he carries his son. No pain just tingling. Tingling started a few months ago. Decreased ROM neck and shoulder. States his neck feels like it needs to be popped. Has bilateral finger locking 4th digits.    Relevant Historical Information: Hypertension, DM type II, Hodgkin's lymphoma  Additional pertinent review of systems negative.   Current Outpatient Medications:    meloxicam  (MOBIC ) 15 MG tablet, Take 1 tablet (15 mg total) by mouth daily., Disp: 30 tablet, Rfl: 0   aspirin  81 MG chewable tablet, Chew 81 mg by mouth daily., Disp: , Rfl:    Continuous Blood Gluc Receiver (DEXCOM G7 RECEIVER) [place]VI, Use as directed four times per day E11.9, Disp: 1 each, Rfl: 0   Continuous Glucose Sensor (DEXCOM G7 SENSOR) MISC, Apply 1 sensor as directed once every 10 days E11.9, Disp: 9 each, Rfl: 1   injection device for insulin  (CEQUR SIMPLICITY 2U) [place], Apply patch every 4 days. Use to inject Humalog  4 clicks (8 units) TID before each meal., Disp: 8 each, Rfl: 5   Injection Device for Insulin  (CEQUR SIMPLICITY INSERTER) MISC, Use as directed to apply CeQur Simplicity Insulin  Patch, Disp: 1 each, Rfl: 0   insulin  glargine, 1 Unit Dial , (TOUJEO ) 300  UNIT/ML Solostar Pen, Take 35 units SQ daily, Disp: 15 mL, Rfl: 5   insulin  lispro (HUMALOG ) 100 UNIT/ML injection, Inject 8-15 units TID AC, Disp: 10 mL, Rfl: 11   Insulin  Pen Needle (PEN NEEDLES) 32G X 6 MM MISC, To use with insulin  injections up to 4x per day, Disp: 100 each, Rfl: 1   Insulin  Syringes, Disposable, U-100 0.3 ML MISC,  Insulin  syringes, Disp: 100 each, Rfl: 0   lisinopril  (ZESTRIL ) 5 MG tablet, Take 1 tablet (5 mg total) by mouth daily., Disp: 90 tablet, Rfl: 0   meloxicam  (MOBIC ) 15 MG tablet, Take 1 tablet (15 mg total) by mouth daily as needed., Disp: 90 tablet, Rfl: 1   predniSONE  (DELTASONE ) 10 MG tablet, 3 tabs by mouth per day for 3 days,2tabs per day for 3 days,1tab per day for 3 days, Disp: 18 tablet, Rfl: 0   rosuvastatin  (CRESTOR ) 40 MG tablet, Take 1 tablet (40 mg total) by mouth daily., Disp: 90 tablet, Rfl: 0   tadalafil  (CIALIS ) 20 MG tablet, Take 0.5-1 tablets (10-20 mg total) by mouth every other day as needed for erectile dysfunction., Disp: 5 tablet, Rfl: 11   Objective:     Vitals:   11/13/23 1519  Pulse: (!) 101  SpO2: 97%  Weight: 227 lb (103 kg)  Height: 6' 2 (1.88 m)      Body mass index is 29.15 kg/m.    Physical Exam:    Neck Exam: Cervical Spine- Posture normal Skin- normal, intact  Neuro:  Strength-  Right Left   Deltoid (C5) 5/5 5/5  Bicep/Brachioradialis (C5/6) 5/5  5/5  Wrist Extension (C6) 5/5 5/5  Tricep (C7) 5/5 5/5  Wrist Flexion (C7) 5/5 5/5  Grip (C8) 5/5 5/5  Finger Abduction (T1) 5/5 5/5   Sensation: intact to light touch in upper extremities bilaterally  Spurling's:  negative bilaterally Neck ROM: Full active ROM, though increased tightness left-sided with right sidebending and rotation TTP: Left trapezius NTTP: cervical spinous processes, cervical paraspinal, thoracic paraspinal, right trapezius   Palpable, mobile nodule at palmar surface of fourth MCP bilaterally with mild TTP on right.  Mild triggering on right  Electronically signed by:  Odis Mace D.[facility] 3:43 PM 11/13/23

## 2023-11-13 ENCOUNTER — Ambulatory Visit: Admitting: Sports Medicine

## 2023-11-13 ENCOUNTER — Ambulatory Visit

## 2023-11-13 VITALS — HR 101 | Ht 74.0 in | Wt 227.0 lb

## 2023-11-13 DIAGNOSIS — M542 Cervicalgia: Secondary | ICD-10-CM

## 2023-11-13 DIAGNOSIS — S46812A Strain of other muscles, fascia and tendons at shoulder and upper arm level, left arm, initial encounter: Secondary | ICD-10-CM

## 2023-11-13 DIAGNOSIS — M6530 Trigger finger, unspecified finger: Secondary | ICD-10-CM | POA: Diagnosis not present

## 2023-11-13 MED ORDER — MELOXICAM 15 MG PO TABS: 15.0000 mg | ORAL_TABLET | Freq: Every day | ORAL | 0 refills | Status: AC

## 2023-11-13 NOTE — Patient Instructions (Addendum)
 Xrays on the way out   Neck HEP   Voltaren  gel over areas of pain   - Start meloxicam  15 mg daily x2 weeks.  If still having pain after 2 weeks, complete 3rd-week of NSAID. May use remaining NSAID as needed once daily for pain control.  Do not to use additional over-the-counter NSAIDs (ibuprofen , naproxen , Advil , Aleve , etc.) while taking prescription NSAIDs.  May use Tylenol  [phone] mg 2 to 3 times a day for breakthrough pain.  As needed follow up if no improvement 2-3 week follow up

## 2023-11-19 ENCOUNTER — Telehealth: Payer: Self-pay

## 2023-11-19 NOTE — Telephone Encounter (Signed)
 Copied from CRM #[id number]. Topic: General - Other >> Nov 19, 2023  1:35 PM Rosina BIRCH wrote: Reason for CRM: penny from [facility] medical called stating the patient can not be seen at their office because he has a balance. Santana wanted to let the referral coordinator know [phone]

## 2023-11-20 ENCOUNTER — Ambulatory Visit: Payer: Self-pay | Admitting: Sports Medicine

## 2023-11-25 ENCOUNTER — Other Ambulatory Visit (INDEPENDENT_AMBULATORY_CARE_PROVIDER_SITE_OTHER): Admitting: Pharmacist

## 2023-11-25 DIAGNOSIS — E1165 Type 2 diabetes mellitus with hyperglycemia: Secondary | ICD-10-CM

## 2023-11-25 DIAGNOSIS — Z794 Long term (current) use of insulin: Secondary | ICD-10-CM

## 2023-11-25 NOTE — Progress Notes (Signed)
 11/25/2023 Name: William Sheppard MRN: [id number] DOB: December 25, 1970  Chief Complaint  Patient presents with   Diabetes   Medication Management    William Sheppard is a 53 y.o. year old male who presented for a telephone visit.   They were referred to the pharmacist by their PCP for assistance in managing diabetes and medication access.   Subjective:  Care Team: Primary Care Provider: Norleen Lynwood ORN, MD ; Next Scheduled Visit: 04/28/23  Medication Access/Adherence  Current Pharmacy:  [facility] [id number] [place], [place] Sheppard Phone: [phone] Fax: [phone]  [id number] - [place] ([place]), [place] - [address] [place] ([place]) [place] [address] Phone: [phone] Fax: [phone]  [id number] [place], [place] - [address] [place] [address] Phone: [phone] Fax: [phone]  DARRYLE LONG - [facility] [address] [place] [address] Phone: [phone] Fax: [phone]   Patient reports affordability concerns with their medications: Yes  Patient reports access/transportation concerns to their pharmacy: No  Patient reports adherence concerns with their medications:  Yes     Diabetes: Current medications: Humalog  - taking 8 units prior to meals unless BG >250 then uses 13-15 units (has been taking more consistently), Toujeo  40 units daily **Pt has started using CeQur insulin  patch for Humalog  and has found this very convenient, however notes it can be painful to apply the patch Medications tried in the past: Farxiga  (dehydration requiring hospitalization), metformin (not tolerated)   Current glucose readings:    Current lifestyle: Has been eating better/lighter, and more active (walking 10 miles per day at job & frequently lifting heavy objects),   Meals: Sometimes breakfast but definitely eats  lunch and dinner with snacks in between He is trying not to eat after 7pm, counting carbs   Lipids:  Current Medication: rosuvastatin  40 mg daily   Objective:  11/04/23 Fructosamine: 310 (correlates to A1c 6.9%)  Lab Results  Component Value Date   HGBA1C 10.5 (H) 10/28/2023    Lab Results  Component Value Date   CREATININE 1.35 10/28/2023   BUN 21 10/28/2023   NA 139 10/28/2023   K 4.4 10/28/2023   CL 104 10/28/2023   CO2 26 10/28/2023    Lab Results  Component Value Date   CHOL 160 10/28/2023   HDL 52.40 10/28/2023   LDLCALC 85 10/28/2023   LDLDIRECT 82.0 08/26/2019   TRIG 116.0 10/28/2023   CHOLHDL 3 10/28/2023    Medications Reviewed Today     Reviewed by Merceda Lela SAUNDERS, RPH (Pharmacist) on 11/25/23 at 1554  Med List Status: <None>   Medication Order Taking? Sig Documenting Provider Last Dose Status Informant  aspirin  81 MG chewable tablet [id number]  Chew 81 mg by mouth daily. [provider]  Active   Continuous Blood Gluc Receiver WILMOT G7 [place]) [place] [id number]  Use as directed four times per day E11.9 Norleen Lynwood ORN, MD  Active   Continuous Glucose Sensor (DEXCOM G7 [place]) [place] [id number]  Apply 1 sensor as directed once every 10 days E11.9 Norleen Lynwood ORN, MD  Active   injection device for insulin  [facility] SIMPLICITY 2U) DEVI [id number]  Apply patch every 4 days. Use to inject Humalog  4 clicks (8 units) TID before each meal. Norleen Lynwood ORN, MD  Active   Injection Device for Insulin  [facility] SIMPLICITY INSERTER) MISC [id number]  Use as directed to  apply CeQur Simplicity Insulin  Patch Norleen Lynwood ORN, MD  Active   insulin  glargine, 1 Unit Dial , (TOUJEO ) 300 UNIT/ML Solostar Pen [id number]  Take 35 units SQ daily Norleen Lynwood ORN, MD  Active   insulin  lispro (HUMALOG ) 100 UNIT/ML injection [id number]  Inject 8-15 units TID [facility] Norleen Lynwood ORN, MD  Active   Insulin  Pen Needle (PEN NEEDLES) 32G X 6 MM MISC [id number]  To use with insulin  injections up to 4x per day  Norleen Lynwood ORN, MD  Active   Insulin  Syringes, Disposable, U-100 0.3 ML MISC [id number]  Insulin  syringes Norleen Lynwood ORN, MD  Active   lisinopril  (ZESTRIL ) 5 MG tablet [id number]  Take 1 tablet (5 mg total) by mouth daily. Norleen Lynwood ORN, MD  Active   meloxicam  (MOBIC ) 15 MG tablet [id number]  Take 1 tablet (15 mg total) by mouth daily as needed. Norleen Lynwood ORN, MD  Active   meloxicam  (MOBIC ) 15 MG tablet [id number]  Take 1 tablet (15 mg total) by mouth daily. Leonce Katz, DO  Active   predniSONE  (DELTASONE ) 10 MG tablet [id number]  3 tabs by mouth per day for 3 days,2tabs per day for 3 days,1tab per day for 3 days Norleen Lynwood ORN, MD  Active   rosuvastatin  (CRESTOR ) 40 MG tablet [id number]  Take 1 tablet (40 mg total) by mouth daily. Norleen Lynwood ORN, MD  Active   tadalafil  (CIALIS ) 20 MG tablet [id number]  Take 0.5-1 tablets (10-20 mg total) by mouth every other day as needed for erectile dysfunction. Norleen Lynwood ORN, MD  Active             Assessment/Plan:   Diabetes: - Currently uncontrolled, A1c <7% - GMI for last 30 days is showing 7.1% and recent fructosamine of 310 while recent A1c was 10.5%. BG have been better controlled since using CeQur patch - Reviewed goal A1c, goal fasting, and goal 2 hour post prandial glucose - Reviewed dietary modifications including increased protein and fiber - Reviewed lifestyle modifications including strength training - Recommended pt try other locations for CeQur patch to see if more comfortable. It can be applied to the abdomen, back of the arms, low back, or thighs. - Recommend to continue Toujeo  and Humalog  at the same dose. Encouraged patient to take Humalog  8 units before each meal without adjusting to determine if his dose needs adjustment   Hyperlipidemia/ASCVD Risk Reduction: - Currently uncontrolled. LDL goal <70 - Continue rosuvastatin  40 mg daily   Follow Up Plan: 10/28   Darrelyn Drum, PharmD, BCPS, CPP Clinical Pharmacist  Practitioner  Primary Care at [facility] [phone]

## 2023-11-25 NOTE — Patient Instructions (Signed)
 It was a pleasure speaking with you today!  Continue current regimen. Use Humalog  8 units before each meal and avoid adjusting dose.  Feel free to call with any questions or concerns!  Darrelyn Drum, PharmD, BCPS, CPP Clinical Pharmacist Practitioner [place] [facility] [phone]

## 2023-11-26 NOTE — Progress Notes (Deleted)
    Ben Jackson D.[facility] [address] [place] [address] Phone: [phone]   Assessment and Plan:     ***    Pertinent previous records reviewed include ***   Follow Up: ***     Subjective:   I, Fayola Meckes, am serving as a Neurosurgeon for Doctor Morene Mace   Chief Complaint: left shoulder and neck pain    HPI:    11/13/2023 Patient is a 53 year old male with left shoulder and neck pain. Patient states left sided tingling in the neck and down the arm when he carries his son. No pain just tingling. Tingling started a few months ago. Decreased ROM neck and shoulder. States his neck feels like it needs to be popped. Has bilateral finger locking 4th digits.    11/27/2023 Patient states   Relevant Historical Information: Hypertension, DM type II, Hodgkin's lymphoma  Additional pertinent review of systems negative.   Current Outpatient Medications:    aspirin  81 MG chewable tablet, Chew 81 mg by mouth daily., Disp: , Rfl:    Continuous Blood Gluc Receiver (DEXCOM G7 RECEIVER) DEVI, Use as directed four times per day E11.9, Disp: 1 each, Rfl: 0   Continuous Glucose Sensor (DEXCOM G7 SENSOR) MISC, Apply 1 sensor as directed once every 10 days E11.9, Disp: 9 each, Rfl: 1   injection device for insulin  (CEQUR SIMPLICITY 2U) [place], Apply patch every 4 days. Use to inject Humalog  4 clicks (8 units) TID before each meal., Disp: 8 each, Rfl: 5   Injection Device for Insulin  (CEQUR SIMPLICITY INSERTER) MISC, Use as directed to apply CeQur Simplicity Insulin  Patch, Disp: 1 each, Rfl: 0   insulin  glargine, 1 Unit Dial , (TOUJEO ) 300 UNIT/ML Solostar Pen, Take 35 units SQ daily, Disp: 15 mL, Rfl: 5   insulin  lispro (HUMALOG ) 100 UNIT/ML injection, Inject 8-15 units TID AC, Disp: 10 mL, Rfl: 11   Insulin  Pen Needle (PEN NEEDLES) 32G X 6 MM MISC, To use with insulin  injections up to 4x per day, Disp: 100 each, Rfl: 1   Insulin  Syringes,  Disposable, U-100 0.3 ML MISC, Insulin  syringes, Disp: 100 each, Rfl: 0   lisinopril  (ZESTRIL ) 5 MG tablet, Take 1 tablet (5 mg total) by mouth daily., Disp: 90 tablet, Rfl: 0   meloxicam  (MOBIC ) 15 MG tablet, Take 1 tablet (15 mg total) by mouth daily as needed., Disp: 90 tablet, Rfl: 1   meloxicam  (MOBIC ) 15 MG tablet, Take 1 tablet (15 mg total) by mouth daily., Disp: 30 tablet, Rfl: 0   predniSONE  (DELTASONE ) 10 MG tablet, 3 tabs by mouth per day for 3 days,2tabs per day for 3 days,1tab per day for 3 days, Disp: 18 tablet, Rfl: 0   rosuvastatin  (CRESTOR ) 40 MG tablet, Take 1 tablet (40 mg total) by mouth daily., Disp: 90 tablet, Rfl: 0   tadalafil  (CIALIS ) 20 MG tablet, Take 0.5-1 tablets (10-20 mg total) by mouth every other day as needed for erectile dysfunction., Disp: 5 tablet, Rfl: 11   Objective:     There were no vitals filed for this visit.    There is no height or weight on file to calculate BMI.    Physical Exam:    ***   Electronically signed by:  Odis Mace D[facility] 7:29 AM 11/26/23

## 2023-11-27 ENCOUNTER — Ambulatory Visit: Admitting: Sports Medicine

## 2023-12-08 ENCOUNTER — Other Ambulatory Visit: Payer: Self-pay | Admitting: Internal Medicine

## 2023-12-08 DIAGNOSIS — E1165 Type 2 diabetes mellitus with hyperglycemia: Secondary | ICD-10-CM

## 2023-12-09 ENCOUNTER — Other Ambulatory Visit: Payer: Self-pay

## 2023-12-09 DIAGNOSIS — Z794 Long term (current) use of insulin: Secondary | ICD-10-CM

## 2023-12-10 ENCOUNTER — Other Ambulatory Visit: Admitting: Pharmacist

## 2023-12-10 DIAGNOSIS — E119 Type 2 diabetes mellitus without complications: Secondary | ICD-10-CM

## 2023-12-10 MED ORDER — INSULIN LISPRO (1 UNIT DIAL) 100 UNIT/ML (KWIKPEN): PEN_INJECTOR | SUBCUTANEOUS | 3 refills | Status: AC

## 2023-12-10 NOTE — Patient Instructions (Signed)
 It was a pleasure speaking with you today!  Continue current regimen. Monitor for low blood sugars.  Feel free to call with any questions or concerns!  Darrelyn Drum, PharmD, BCPS, CPP Clinical Pharmacist Practitioner [place] [facility] at [facility] [phone]

## 2023-12-10 NOTE — Progress Notes (Signed)
   12/10/2023 Name: William Sheppard MRN: [id number] DOB: [birth year]  Chief Complaint  Patient presents with   Diabetes   Medication Management    William Sheppard is a 53 y.o. year old male who presented for a telephone visit.   They were referred to the pharmacist by their PCP for assistance in managing diabetes and medication access.   Subjective:  Care Team: Primary Care Provider: Norleen Lynwood ORN, MD ; Next Scheduled Visit: 04/28/23  Medication Access/Adherence  Current Pharmacy:  [facility] [id number] [place], [place] - [address] [place] [address] Phone: [phone] Fax: [phone]  [id number] - [place] ([place]), [place] - [address]  (SE) [place] [address] Phone: [phone] Fax: [phone]  [id number] - [place], [place] - [address] [place] [address] Phone: [phone] Fax: [phone]  DARRYLE LONG - [facility] [address] [place] [address] Phone: [phone] Fax: [phone]   Patient reports affordability concerns with their medications: Yes  Patient reports access/transportation concerns to their pharmacy: No  Patient reports adherence concerns with their medications:  Yes     Diabetes: Current medications: Humalog  - taking 8 units prior to meals unless BG >250 then uses 10-15 units, Toujeo  40 units daily **Has not been using CeQur Patch, is not comfortable for him Medications tried in the past: Farxiga  (dehydration requiring hospitalization), metformin (not tolerated)   Current glucose readings:    Current lifestyle: Has been eating better/lighter, and more active (walking 10 miles per day at job & frequently lifting heavy objects),   Meals: Sometimes breakfast but definitely eats lunch and dinner with snacks in between He is trying not to eat after 7pm, counting carbs   Lipids:  Current  Medication: rosuvastatin  40 mg daily   Objective:  11/04/23 Fructosamine: 310 (correlates to A1c 6.9%)  Lab Results  Component Value Date   HGBA1C 10.5 (H) 10/28/2023    Lab Results  Component Value Date   CREATININE 1.35 10/28/2023   BUN 21 10/28/2023   NA 139 10/28/2023   K 4.4 10/28/2023   CL 104 10/28/2023   CO2 26 10/28/2023    Lab Results  Component Value Date   CHOL 160 10/28/2023   HDL 52.40 10/28/2023   LDLCALC 85 10/28/2023   LDLDIRECT 82.0 08/26/2019   TRIG 116.0 10/28/2023   CHOLHDL 3 10/28/2023    Medications Reviewed Today   Medications were not reviewed in this encounter     Assessment/Plan:   Diabetes: - Currently uncontrolled, A1c <7% - GMI for last 30 days is showing 7.2% and recent fructosamine of 310 while recent A1c was 10.5%.  - Reviewed goal A1c, goal fasting, and goal 2 hour post prandial glucose - Reviewed dietary modifications including increased protein and fiber - Reviewed lifestyle modifications including strength training - Recommend to continue Toujeo  and Humalog  at the same dose   Hyperlipidemia/ASCVD Risk Reduction: - Currently uncontrolled. LDL goal <70 - Continue rosuvastatin  40 mg daily   Follow Up Plan: 11/25   Darrelyn Drum, PharmD, BCPS, CPP Clinical Pharmacist Practitioner [facility] [phone]

## 2024-01-07 ENCOUNTER — Other Ambulatory Visit: Admitting: Pharmacist

## 2024-01-07 DIAGNOSIS — E1165 Type 2 diabetes mellitus with hyperglycemia: Secondary | ICD-10-CM

## 2024-01-07 NOTE — Patient Instructions (Signed)
 It was a pleasure speaking with you today!  Recommend to continue Toujeo  and Humalog  at the same dose. Monitor for frequent lows. May need to reduce Toujeo  by 2-4 units if having lows overnight.  Feel free to call with any questions or concerns!  Darrelyn Drum, PharmD, BCPS, CPP Clinical Pharmacist Practitioner [facility] [phone]

## 2024-01-07 NOTE — Progress Notes (Signed)
   01/07/2024 Name: William Sheppard MRN: [id number] DOB: [birth year]  Chief Complaint  Patient presents with   Diabetes   Medication Management    William Sheppard is a 53 y.o. year old male who presented for a telephone visit.   They were referred to the pharmacist by their PCP for assistance in managing diabetes and medication access.   Subjective:  Care Team: Primary Care Provider: Norleen Lynwood ORN, MD ; Next Scheduled Visit:   Medication Access/Adherence  Current Pharmacy:  [facility] [id number] - [address], [place] - [address] [place] [address] Phone: [phone] Fax: [phone]  [facility] - [address] ([address]), [place] - [address] [place] (SE) [place] [address] Phone: [phone] Fax: [phone]  [id number] [place], [place] - [address] [place] [address] Phone: [phone] Fax: [phone]  DARRYLE LONG - [facility] [address] [place] [address] Phone: [phone] Fax: [phone]   Patient reports affordability concerns with their medications: Yes  Patient reports access/transportation concerns to their pharmacy: No  Patient reports adherence concerns with their medications:  Yes     Diabetes: Current medications: Humalog  - taking 10 units BID AC, Toujeo  40 units daily  Medications tried in the past: Farxiga  (dehydration requiring hospitalization), metformin (not tolerated)   Current glucose readings:    Current lifestyle: Has been eating better/lighter, and more active (walking 10 miles per day at job & frequently lifting heavy objects),   Meals: Sometimes breakfast but definitely eats lunch and dinner with snacks in between He is trying not to eat after 7pm, counting carbs   Lipids:  Current Medication: rosuvastatin  40 mg daily   Objective:  11/04/23 Fructosamine: 310 (correlates to A1c 6.9%)  Lab  Results  Component Value Date   HGBA1C 10.5 (H) 10/28/2023    Lab Results  Component Value Date   CREATININE 1.35 10/28/2023   BUN 21 10/28/2023   NA 139 10/28/2023   K 4.4 10/28/2023   CL 104 10/28/2023   CO2 26 10/28/2023    Lab Results  Component Value Date   CHOL 160 10/28/2023   HDL 52.40 10/28/2023   LDLCALC 85 10/28/2023   LDLDIRECT 82.0 08/26/2019   TRIG 116.0 10/28/2023   CHOLHDL 3 10/28/2023    Medications Reviewed Today   Medications were not reviewed in this encounter     Assessment/Plan:   Diabetes: - Currently uncontrolled, A1c <7% - GMI for last 30 days is showing 7.0% and recent fructosamine of 310 while recent A1c was 10.5%.  - Reviewed goal A1c, goal fasting, and goal 2 hour post prandial glucose - Reviewed dietary modifications including increased protein and fiber - Reviewed lifestyle modifications including strength training - Recommend to continue Toujeo  and Humalog  at the same dose. Monitor for frequent lows. May need to reduce Toujeo  by 2-4 units if having lows overnight.   Hyperlipidemia/ASCVD Risk Reduction: - Currently uncontrolled. LDL goal <70 - Continue rosuvastatin  40 mg daily  Discussed with patient that Dr. Norleen is retiring and his March PCP f/u has been cancelled. He notes he would like to stay at [facility] and does not have a preference for which provider he goes to.  Follow Up Plan: 1/6   Darrelyn Drum, PharmD, BCPS, CPP Clinical Pharmacist Practitioner [facility] [phone]

## 2024-02-07 ENCOUNTER — Other Ambulatory Visit: Payer: Self-pay | Admitting: Internal Medicine

## 2024-02-10 ENCOUNTER — Ambulatory Visit: Admitting: Internal Medicine

## 2024-02-18 ENCOUNTER — Other Ambulatory Visit

## 2024-02-18 NOTE — Progress Notes (Unsigned)
" ° °  02/18/2024 Name: William Sheppard MRN: [id number] DOB: Aug 17, 1970  Chief Complaint  Patient presents with   Diabetes    William Sheppard is a 54 y.o. year old male who presented for a telephone visit.   They were referred to the pharmacist by their PCP for assistance in managing diabetes and medication access.   Subjective:  Care Team: Primary Care Provider: Norleen Lynwood ORN, MD ; Next Scheduled Visit: none scheduled  Medication Access/Adherence  Current Pharmacy:  [facility] [id number] [place], [place] - [address] [place] [address] Phone: [phone] Fax: [phone]  [facility] - [address] ([address]), [place] - [address] [place] ([place]) [place] [address] Phone: [phone] Fax: [phone]  [id number] - [place], [place] - [address] [place] [address] Phone: [phone] Fax: [phone]  DARRYLE LONG - [facility] [address] [place] [address] Phone: [phone] Fax: [phone]   Patient reports affordability concerns with their medications: Yes  Patient reports access/transportation concerns to their pharmacy: No  Patient reports adherence concerns with their medications:  Yes     Diabetes: Current medications: Humalog  - taking 10 units BID AC, Toujeo  40 units daily  Medications tried in the past: Farxiga  (dehydration requiring hospitalization), metformin (not tolerated)   Current glucose readings:    Current lifestyle: Has been eating better/lighter, and more active (walking 10 miles per day at job & frequently lifting heavy objects),   Meals: Sometimes breakfast but definitely eats lunch and dinner with snacks in between He is trying not to eat after 7pm, counting carbs   Lipids:  Current Medication: rosuvastatin  40 mg daily   Objective:  11/04/23 Fructosamine: 310 (correlates to A1c 6.9%)  Lab Results   Component Value Date   HGBA1C 10.5 (H) 10/28/2023    Lab Results  Component Value Date   CREATININE 1.35 10/28/2023   BUN 21 10/28/2023   NA 139 10/28/2023   K 4.4 10/28/2023   CL 104 10/28/2023   CO2 26 10/28/2023    Lab Results  Component Value Date   CHOL 160 10/28/2023   HDL 52.40 10/28/2023   LDLCALC 85 10/28/2023   LDLDIRECT 82.0 08/26/2019   TRIG 116.0 10/28/2023   CHOLHDL 3 10/28/2023    Medications Reviewed Today   Medications were not reviewed in this encounter     Assessment/Plan:   Diabetes: - Currently uncontrolled, A1c <7% - GMI for last 30 days is showing 7.4% and recent fructosamine of 310 while recent A1c was 10.5%.  - Reviewed goal A1c, goal fasting, and goal 2 hour post prandial glucose - Reviewed dietary modifications including increased protein and fiber - Reviewed lifestyle modifications including strength training - Recommend to continue Toujeo  and Humalog  at the same dose. Monitor for frequent lows. May need to reduce Toujeo  by 2-4 units if having lows overnight.   Hyperlipidemia/ASCVD Risk Reduction: - Currently uncontrolled. LDL goal <70 - Continue rosuvastatin  40 mg daily  Discussed with patient that Dr. Norleen is retiring and his March PCP f/u has been cancelled. He notes he would like to stay at [facility] and does not have a preference for which provider he goes to.  Follow Up Plan: 1/6   Darrelyn Drum, PharmD, BCPS, CPP Clinical Pharmacist Practitioner [facility] [phone]  "

## 2024-04-27 ENCOUNTER — Ambulatory Visit: Admitting: Internal Medicine
